# Patient Record
Sex: Female | Born: 1960 | Race: Black or African American | Hispanic: No | Marital: Married | Smoking: Never smoker
Health system: Southern US, Community
[De-identification: ages and names within clinical notes are randomized; demographics above are authoritative.]

## PROBLEM LIST (undated history)

## (undated) DIAGNOSIS — I739 Peripheral vascular disease, unspecified: Secondary | ICD-10-CM

## (undated) DIAGNOSIS — D649 Anemia, unspecified: Secondary | ICD-10-CM

## (undated) DIAGNOSIS — R0602 Shortness of breath: Secondary | ICD-10-CM

## (undated) DIAGNOSIS — J449 Chronic obstructive pulmonary disease, unspecified: Secondary | ICD-10-CM

## (undated) DIAGNOSIS — E7800 Pure hypercholesterolemia, unspecified: Secondary | ICD-10-CM

## (undated) DIAGNOSIS — Z5189 Encounter for other specified aftercare: Secondary | ICD-10-CM

## (undated) DIAGNOSIS — I2510 Atherosclerotic heart disease of native coronary artery without angina pectoris: Secondary | ICD-10-CM

## (undated) DIAGNOSIS — N186 End stage renal disease: Secondary | ICD-10-CM

## (undated) DIAGNOSIS — I509 Heart failure, unspecified: Secondary | ICD-10-CM

## (undated) DIAGNOSIS — K922 Gastrointestinal hemorrhage, unspecified: Secondary | ICD-10-CM

## (undated) DIAGNOSIS — E119 Type 2 diabetes mellitus without complications: Secondary | ICD-10-CM

## (undated) DIAGNOSIS — J189 Pneumonia, unspecified organism: Secondary | ICD-10-CM

## (undated) DIAGNOSIS — I5023 Acute on chronic systolic (congestive) heart failure: Secondary | ICD-10-CM

## (undated) DIAGNOSIS — Z992 Dependence on renal dialysis: Secondary | ICD-10-CM

## (undated) DIAGNOSIS — I209 Angina pectoris, unspecified: Secondary | ICD-10-CM

## (undated) DIAGNOSIS — I10 Essential (primary) hypertension: Secondary | ICD-10-CM

## (undated) DIAGNOSIS — D573 Sickle-cell trait: Secondary | ICD-10-CM

## (undated) DIAGNOSIS — R011 Cardiac murmur, unspecified: Secondary | ICD-10-CM

## (undated) DIAGNOSIS — I82409 Acute embolism and thrombosis of unspecified deep veins of unspecified lower extremity: Secondary | ICD-10-CM

## (undated) DIAGNOSIS — IMO0001 Reserved for inherently not codable concepts without codable children: Secondary | ICD-10-CM

## (undated) HISTORY — PX: VASCULAR SURGERY: SHX849

## (undated) HISTORY — DX: Acute on chronic systolic (congestive) heart failure: I50.23

## (undated) HISTORY — PX: CARDIAC CATHETERIZATION: SHX172

## (undated) HISTORY — PX: AV FISTULA PLACEMENT: SHX1204

---

## 1989-05-05 HISTORY — PX: ABDOMINAL HYSTERECTOMY: SHX81

## 1999-03-10 ENCOUNTER — Encounter: Payer: Self-pay | Admitting: Emergency Medicine

## 1999-03-10 ENCOUNTER — Emergency Department (HOSPITAL_COMMUNITY): Admission: EM | Admit: 1999-03-10 | Discharge: 1999-03-10 | Payer: Self-pay | Admitting: Emergency Medicine

## 2000-07-16 ENCOUNTER — Encounter: Payer: Self-pay | Admitting: Cardiology

## 2000-07-16 ENCOUNTER — Inpatient Hospital Stay (HOSPITAL_COMMUNITY): Admission: AD | Admit: 2000-07-16 | Discharge: 2000-07-18 | Payer: Self-pay | Admitting: Cardiology

## 2001-05-01 ENCOUNTER — Encounter: Payer: Self-pay | Admitting: Cardiology

## 2001-05-01 ENCOUNTER — Encounter: Payer: Self-pay | Admitting: Emergency Medicine

## 2001-05-02 ENCOUNTER — Inpatient Hospital Stay (HOSPITAL_COMMUNITY): Admission: EM | Admit: 2001-05-02 | Discharge: 2001-05-08 | Payer: Self-pay | Admitting: Emergency Medicine

## 2001-05-03 ENCOUNTER — Encounter: Payer: Self-pay | Admitting: Cardiology

## 2001-05-04 ENCOUNTER — Encounter: Payer: Self-pay | Admitting: Cardiovascular Disease

## 2001-06-27 ENCOUNTER — Encounter: Payer: Self-pay | Admitting: Emergency Medicine

## 2001-06-27 ENCOUNTER — Inpatient Hospital Stay (HOSPITAL_COMMUNITY): Admission: EM | Admit: 2001-06-27 | Discharge: 2001-06-30 | Payer: Self-pay | Admitting: Emergency Medicine

## 2001-06-28 ENCOUNTER — Encounter: Payer: Self-pay | Admitting: Cardiovascular Disease

## 2003-07-19 ENCOUNTER — Inpatient Hospital Stay (HOSPITAL_COMMUNITY): Admission: EM | Admit: 2003-07-19 | Discharge: 2003-07-23 | Payer: Self-pay | Admitting: Emergency Medicine

## 2003-12-05 ENCOUNTER — Inpatient Hospital Stay (HOSPITAL_COMMUNITY): Admission: EM | Admit: 2003-12-05 | Discharge: 2003-12-13 | Payer: Self-pay | Admitting: Emergency Medicine

## 2004-02-24 ENCOUNTER — Ambulatory Visit (HOSPITAL_COMMUNITY): Admission: RE | Admit: 2004-02-24 | Discharge: 2004-02-24 | Payer: Self-pay | Admitting: Vascular Surgery

## 2004-05-18 ENCOUNTER — Ambulatory Visit (HOSPITAL_COMMUNITY): Admission: RE | Admit: 2004-05-18 | Discharge: 2004-05-18 | Payer: Self-pay | Admitting: Nephrology

## 2008-06-23 ENCOUNTER — Inpatient Hospital Stay (HOSPITAL_COMMUNITY): Admission: EM | Admit: 2008-06-23 | Discharge: 2008-06-26 | Payer: Self-pay | Admitting: Emergency Medicine

## 2008-06-24 ENCOUNTER — Encounter (INDEPENDENT_AMBULATORY_CARE_PROVIDER_SITE_OTHER): Payer: Self-pay | Admitting: Nephrology

## 2009-04-02 ENCOUNTER — Emergency Department (HOSPITAL_COMMUNITY): Admission: EM | Admit: 2009-04-02 | Discharge: 2009-04-02 | Payer: Self-pay | Admitting: Emergency Medicine

## 2009-09-04 DIAGNOSIS — I82409 Acute embolism and thrombosis of unspecified deep veins of unspecified lower extremity: Secondary | ICD-10-CM

## 2009-09-04 HISTORY — PX: ARTERIOVENOUS GRAFT PLACEMENT: SUR1029

## 2009-09-04 HISTORY — DX: Acute embolism and thrombosis of unspecified deep veins of unspecified lower extremity: I82.409

## 2010-01-10 ENCOUNTER — Ambulatory Visit: Payer: Self-pay | Admitting: Pulmonary Disease

## 2010-01-11 ENCOUNTER — Inpatient Hospital Stay (HOSPITAL_COMMUNITY): Admission: EM | Admit: 2010-01-11 | Discharge: 2010-01-13 | Payer: Self-pay | Admitting: Emergency Medicine

## 2010-02-09 ENCOUNTER — Inpatient Hospital Stay (HOSPITAL_BASED_OUTPATIENT_CLINIC_OR_DEPARTMENT_OTHER): Admission: RE | Admit: 2010-02-09 | Discharge: 2010-02-09 | Payer: Self-pay | Admitting: Cardiology

## 2010-05-05 HISTORY — PX: ABOVE KNEE LEG AMPUTATION: SUR20

## 2010-05-18 ENCOUNTER — Ambulatory Visit: Payer: Self-pay | Admitting: Vascular Surgery

## 2010-05-20 ENCOUNTER — Emergency Department (HOSPITAL_COMMUNITY): Admission: EM | Admit: 2010-05-20 | Discharge: 2010-05-20 | Payer: Self-pay | Admitting: Emergency Medicine

## 2010-05-23 ENCOUNTER — Inpatient Hospital Stay (HOSPITAL_COMMUNITY): Admission: EM | Admit: 2010-05-23 | Discharge: 2010-05-30 | Payer: Self-pay | Admitting: Emergency Medicine

## 2010-05-23 ENCOUNTER — Ambulatory Visit: Payer: Self-pay | Admitting: Vascular Surgery

## 2010-05-25 ENCOUNTER — Encounter: Payer: Self-pay | Admitting: Vascular Surgery

## 2010-05-26 ENCOUNTER — Ambulatory Visit: Payer: Self-pay | Admitting: Physical Medicine & Rehabilitation

## 2010-05-30 ENCOUNTER — Inpatient Hospital Stay (HOSPITAL_COMMUNITY)
Admission: RE | Admit: 2010-05-30 | Discharge: 2010-06-18 | Payer: Self-pay | Admitting: Physical Medicine & Rehabilitation

## 2010-06-04 HISTORY — PX: LEG AMPUTATION BELOW KNEE: SHX694

## 2010-06-08 ENCOUNTER — Ambulatory Visit: Payer: Self-pay | Admitting: Infectious Disease

## 2010-06-10 ENCOUNTER — Encounter: Payer: Self-pay | Admitting: Vascular Surgery

## 2010-06-10 ENCOUNTER — Ambulatory Visit: Payer: Self-pay | Admitting: Surgery

## 2010-06-15 ENCOUNTER — Ambulatory Visit: Payer: Self-pay | Admitting: Physical Medicine & Rehabilitation

## 2010-06-23 ENCOUNTER — Ambulatory Visit: Payer: Self-pay | Admitting: Vascular Surgery

## 2010-06-30 ENCOUNTER — Other Ambulatory Visit: Payer: Self-pay | Admitting: Vascular Surgery

## 2010-07-01 ENCOUNTER — Inpatient Hospital Stay (HOSPITAL_COMMUNITY): Admission: EM | Admit: 2010-07-01 | Discharge: 2010-07-21 | Payer: Self-pay | Admitting: Emergency Medicine

## 2010-07-04 ENCOUNTER — Encounter (INDEPENDENT_AMBULATORY_CARE_PROVIDER_SITE_OTHER): Payer: Self-pay | Admitting: Emergency Medicine

## 2010-07-06 ENCOUNTER — Encounter
Admission: RE | Admit: 2010-07-06 | Discharge: 2010-09-01 | Payer: Self-pay | Source: Home / Self Care | Attending: Physical Medicine & Rehabilitation | Admitting: Physical Medicine & Rehabilitation

## 2010-07-18 ENCOUNTER — Ambulatory Visit: Payer: Self-pay | Admitting: Vascular Surgery

## 2010-07-18 ENCOUNTER — Encounter (INDEPENDENT_AMBULATORY_CARE_PROVIDER_SITE_OTHER): Payer: Self-pay | Admitting: Emergency Medicine

## 2010-07-21 ENCOUNTER — Inpatient Hospital Stay (HOSPITAL_COMMUNITY)
Admission: RE | Admit: 2010-07-21 | Discharge: 2010-07-27 | Payer: Self-pay | Admitting: Physical Medicine & Rehabilitation

## 2010-07-21 ENCOUNTER — Ambulatory Visit: Payer: Self-pay | Admitting: Physical Medicine & Rehabilitation

## 2010-07-22 ENCOUNTER — Ambulatory Visit: Payer: Self-pay | Admitting: Physical Medicine & Rehabilitation

## 2010-08-11 ENCOUNTER — Ambulatory Visit: Payer: Self-pay | Admitting: Vascular Surgery

## 2010-08-18 ENCOUNTER — Ambulatory Visit: Payer: Self-pay | Admitting: Vascular Surgery

## 2010-08-25 ENCOUNTER — Ambulatory Visit: Payer: Self-pay | Admitting: Vascular Surgery

## 2010-09-06 ENCOUNTER — Encounter
Admission: RE | Admit: 2010-09-06 | Discharge: 2010-09-09 | Payer: Self-pay | Source: Home / Self Care | Attending: Physical Medicine & Rehabilitation | Admitting: Physical Medicine & Rehabilitation

## 2010-09-08 ENCOUNTER — Ambulatory Visit
Admission: RE | Admit: 2010-09-08 | Discharge: 2010-09-08 | Payer: Self-pay | Source: Home / Self Care | Attending: Vascular Surgery | Admitting: Vascular Surgery

## 2010-09-09 ENCOUNTER — Ambulatory Visit
Admission: RE | Admit: 2010-09-09 | Discharge: 2010-09-09 | Payer: Self-pay | Source: Home / Self Care | Attending: Physical Medicine & Rehabilitation | Admitting: Physical Medicine & Rehabilitation

## 2010-09-29 ENCOUNTER — Ambulatory Visit
Admission: RE | Admit: 2010-09-29 | Discharge: 2010-09-29 | Payer: Self-pay | Source: Home / Self Care | Attending: Vascular Surgery | Admitting: Vascular Surgery

## 2010-09-30 NOTE — Assessment & Plan Note (Signed)
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  The left below knee amputation was done on October 31 and she has had some difficulty healing up the medial aspect of this.  She reports no significant drainage from this.  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 This is usually relieved with hydrocodone.  She has not previously taken Lyrica or Neurontin for this.  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 She has had no bleeding episodes, so far.  REVIEW OF SYSTEMS:  She denies any shortness of breath, chest pain, fever or chills.  PHYSICAL EXAM:  Vital signs:  Blood pressure 188/91 in the right arm, heart rate 87 and regular, respirations 16.  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  The aneurysm was approximately 4.5 cm in diameter.  The fistula is fairly pulsatile in quality.  There are 2 areas of eschar and ulceration approximately 2 cm in diameter for each of these.  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  The left below knee amputation has an area 4 x 3 cm with dry eschar over this. This was debrided back to bleeding tissue today.  There was some necrotic fat below this but there were areas of bleeding.  This was then covered with a normal saline wet-to-dry dressing.  ASSESSMENT:  Slowly healing left below knee amputation.  We will continue serial debridements and she will follow up in 2 weeks' time  to reevaluate this.  Her daughter was trained today to do normal saline wet- to-dry dressings twice a day.  As far as the AV fistula aneurysm is concerned I believe it is at risk of rupture and I believe the best option at this point would be conversion of the fistula to a graft by placing an interposition segment where the aneurysmal degeneration has occurred.  We will plan to place a left upper arm AV graft and a Diatek catheter on 10/03/2010.  Risks, benefits, possible complications and procedure details of this were explained to the patient today.  She understands and agrees to proceed.    [name]. Fields, MD Electronically Signed  CEF/MEDQ  D:  09/30/2010  T:  09/30/2010  Job:  [id number]  cc:   Cecille Aver, M.D.

## 2010-10-03 ENCOUNTER — Ambulatory Visit (HOSPITAL_COMMUNITY)
Admission: RE | Admit: 2010-10-03 | Discharge: 2010-10-03 | Payer: Self-pay | Source: Home / Self Care | Attending: Vascular Surgery | Admitting: Vascular Surgery

## 2010-10-03 LAB — POCT I-STAT 4, (NA,K, GLUC, HGB,HCT)
HCT: 41 % (ref 36.0–46.0)
Hemoglobin: 13.9 g/dL (ref 12.0–15.0)

## 2010-10-03 LAB — SURGICAL PCR SCREEN: MRSA, PCR: NEGATIVE

## 2010-10-03 LAB — GLUCOSE, CAPILLARY: Glucose-Capillary: 96 mg/dL (ref 70–99)

## 2010-10-07 NOTE — Op Note (Signed)
[name], DONELAN              ACCOUNT NO.:  [id number]  MEDICAL RECORD NO.:  [id number]          PATIENT TYPE:  AMB  LOCATION:  SDS                          FACILITY:  [facility]  PHYSICIAN:  Charles E. Fields, MD  DATE OF BIRTH:  12-01-1960  DATE OF PROCEDURE:  10/03/2010 DATE OF DISCHARGE:                              OPERATIVE REPORT   PROCEDURES: 1. Ultrasound of neck. 2. Insertion of Diatek catheter, left internal jugular vein. 3. Placement of left upper arm AV graft.  PREOPERATIVE DIAGNOSIS:  End-stage renal disease.  POSTOPERATIVE DIAGNOSIS:  End-stage renal disease.  ANESTHESIA:  Local with IV sedation.  Lippe:  [name]. 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  She presents for conversion of this fistula to a graft as well as placement of a Diatek catheter for temporary access until her graft was ready for use.  OPERATIVE FINDINGS: 1. An 8-mm PTFE end-to-end to proximal and distal cephalic vein. 2. A 23-cm Diatek catheter of left internal jugular vein. 3. 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  OPERATIVE DETAILS:  After obtaining informed consent, the patient was taken to the operating room.  The patient was placed in supine position on the operating room table.  After adequate sedation, the patient's entire neck and chest were prepped and draped in usual sterile fashion. Ultrasound was used to identify the right internal jugular vein.  This did not have normal compressibility and was presumably occluded.  Attention was then turned to the left internal jugular vein.  This had normal compressibility on respiratory variation.  Local anesthesia was then infiltrated over the left internal jugular vein.  An introducer needle was used to cannulate the left internal jugular vein and a 0.035 J-tip guide wire was threaded through  the left internal jugular vein through the innominate vein and down into the superior and finally inferior vena cava under fluoroscopic guidance.  Next, sequential 12, 14 and 16-French dilators were placed over the guidewire.  Guidewire and dilator were removed and a 22-cm centimeter Diatek catheter was placed through the peel-away sheath down into the right atrium.  The catheter was then tunneled subcutaneously, cut to the length and the hub attached.  The catheters were noted to flush and draw easily.  The catheter was loaded with concentrated heparin solution.  The catheter was sutured to the skin with nylon sutures.  The neck insertion site was closed with Vicryl stitch.  Attention was then turned to the patient's left upper extremity.  The patient's entire left upper extremity was prepped and draped in usual sterile fashion.  Local anesthesia was infiltrated approximately 2 cm above the antecubital crease over the area of a preexisting left brachiocephalic AV fistula.  Incision was carried down through the subcutaneous tissues down to the level of the fistula.  The fistula was approximately 10 mm in diameter in this location.  This was dissected free circumferentially and a vessel loop placed around this.  Local anesthesia was then infiltrated past the two areas of aneurysmal  generation approximately 7 cm from the shoulder joint.  A longitudinal incision was made in this location and carried down through the subcutaneous tissues and the cephalic vein was dissected free circumferentially.  This is approximately 5 mm in diameter in this location.  A subcutaneous tunnel was then created in an arcing configuration over the biceps muscle connecting the upper and lower arm incisions.  An 8-mm PTFE graft was then brought through the subcutaneous tunnel.  The patient was given 5000 units of intravenous heparin.  The proximal fistula was clamped with a fistula clamp and distal control  was obtained just above this.  The fistula was then transected.  The aneurysmal portion of the fistula was then oversewn with a running 5-0 Prolene suture.  An end-to-end anastomosis was then created from the new PTFE graft to the proximal fistula using a running 6-0 Prolene suture. Just prior to completion of the anastomosis, this was forebled, backbled and thoroughly flushed.  Anastomosis was secured, clamps were released, and there was good pulsatile flow in the graft immediately.  There was some sutured needle home bleeding and this was controlled with thrombin and Gelfoam.  Attention was then turned to the upper arm incision.  The distal vein was controlled with a fistula clamp.  Proximal vein controlled with fistula clamp and the fistula transected.  The vein was of good quality approximately 5 mm in diameter.  The graft was transected and sewn end of graft to end of vein using a running 6-0 Prolene suture.  Just prior to completion of the anastomosis, this was forebled, backbled and thoroughly flushed.  Anastomosis was secured, clamps were released, and there was a palpable thrill above the graft immediately.  The distal end of the preexisting fistula was then compressed, so that all blood was removed from the aneurysmal degenerated segments and then, the distal end of the cephalic vein was oversewn using a running 5-0 Prolene suture.  The patient was given 50 mg of protamine for reversal of heparin.  Subcutaneous tissues of both incisions were reapproximated after obtaining hemostasis.  Each was closed with a running 3-0 Vicryl subcutaneous stitch and then a 4-0 Vicryl subcuticular stitch in the skin.  Doppler was used to evaluate the radial and ulnar arteries, these both had good Doppler flow. Dermabond was applied to both incisions.  The patient was taken to the PACU in stable condition.     [name]. Fields, MD     CEF/MEDQ  D:  10/03/2010  T:  10/04/2010  Job:   [id number]  Electronically Signed by Fabienne Bruns MD on 10/07/2010 01:11:46 PM

## 2010-10-13 ENCOUNTER — Ambulatory Visit (INDEPENDENT_AMBULATORY_CARE_PROVIDER_SITE_OTHER): Payer: BLUE CROSS/BLUE SHIELD | Admitting: Vascular Surgery

## 2010-10-13 DIAGNOSIS — I70219 Atherosclerosis of native arteries of extremities with intermittent claudication, unspecified extremity: Secondary | ICD-10-CM

## 2010-10-14 NOTE — Assessment & Plan Note (Signed)
OFFICE VISIT  KEIKO, MYRICKS DOB:  05/29/1961                                       10/13/2010 [id number]#:[id number]  The patient returns for followup today.  She had a left upper arm graft placed on January 30.  We have also been following her for a poorly healing wound of her left below knee amputation.  She returns today for followup.  She denies any symptoms of steal in her left upper extremity. She has been doing normal saline wet-to-dry dressings to her left BKA twice daily.  Her daughter has been doing the wound care.  PHYSICAL EXAM:  Vital signs:  Today blood pressure is 156/78 in the right arm, heart rate is 81 and regular.  Temperature is 98.4.  Upper extremity:  The left upper arm AV graft has healing antecubital and upper arm incisions.  There is an easily audible bruit within the graft. 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  Her left below knee amputation has a 2 x 2 cm open area in the medial aspect.  This has good granulation tissue in the base at this point and I believe this should continue to heal over the next few weeks.  Overall the patient is doing well.  Her graft should be ready for use in a few more weeks.  Her BKA will continue to follow with local wound care.  She will follow up with me in 2 weeks' time to recheck her graft and her below knee amputation.  She will continue to do normal saline dressings twice daily.    [name]. Bretta Fees, MD Electronically Signed  CEF/MEDQ  D:  10/13/2010  T:  10/14/2010  Job:  [id number]

## 2010-10-27 ENCOUNTER — Ambulatory Visit (INDEPENDENT_AMBULATORY_CARE_PROVIDER_SITE_OTHER): Payer: Medicare Other | Admitting: Vascular Surgery

## 2010-10-27 DIAGNOSIS — L98499 Non-pressure chronic ulcer of skin of other sites with unspecified severity: Secondary | ICD-10-CM

## 2010-10-27 DIAGNOSIS — I739 Peripheral vascular disease, unspecified: Secondary | ICD-10-CM

## 2010-10-28 NOTE — Assessment & Plan Note (Signed)
OFFICE VISIT  Paula, BENTLEY [name] DOB:  Oct 09, 1960                                       10/27/2010 [id number]#:[id number]  The patient returns for followup today for further evaluation of her left below knee amputation as well as recent placement of a left upper arm AV graft for aneurysmally degenerated left upper arm AV fistula. Her left arm AV graft was placed on 10/03/2010.  Her below knee amputation was on 07/04/2010.  Her daughter is currently doing wound care on the below knee amputation, normal saline wet-to-dry twice a day.  PHYSICAL EXAM:  Blood pressure is 153/82 in the right arm, heart rate is 87 and regular, temperature is 98.  She has an easily palpable thrill and audible bruit in her left upper arm AV graft.  Incisions are well- healed.  She has a 2 x 2 cm opening in the left below knee amputation. There is very healthy-appearing granulation tissue at the base of this at this point.  Overall I believe this wound should heal in the long- term.  In summary, the patient's left upper arm AV graft is healing well and should be ready for use soon.  Her left below knee amputation continues to heal and should continue followup for this.  We will see her again in 3 weeks.    Paula Hora. Fields, MD Electronically Signed  CEF/MEDQ  D:  10/27/2010  T:  10/28/2010  Job:  [id number]

## 2010-11-15 LAB — RENAL FUNCTION PANEL
Albumin: 1.6 g/dL — ABNORMAL LOW (ref 3.5–5.2)
Albumin: 1.7 g/dL — ABNORMAL LOW (ref 3.5–5.2)
Albumin: 1.7 g/dL — ABNORMAL LOW (ref 3.5–5.2)
Albumin: 1.8 g/dL — ABNORMAL LOW (ref 3.5–5.2)
BUN: 33 mg/dL — ABNORMAL HIGH (ref 6–23)
BUN: 40 mg/dL — ABNORMAL HIGH (ref 6–23)
BUN: 42 mg/dL — ABNORMAL HIGH (ref 6–23)
BUN: 53 mg/dL — ABNORMAL HIGH (ref 6–23)
CO2: 24 mEq/L (ref 19–32)
CO2: 25 mEq/L (ref 19–32)
CO2: 27 mEq/L (ref 19–32)
CO2: 27 mEq/L (ref 19–32)
CO2: 28 mEq/L (ref 19–32)
CO2: 29 mEq/L (ref 19–32)
Calcium: 7.9 mg/dL — ABNORMAL LOW (ref 8.4–10.5)
Calcium: 8 mg/dL — ABNORMAL LOW (ref 8.4–10.5)
Calcium: 8.3 mg/dL — ABNORMAL LOW (ref 8.4–10.5)
Calcium: 8.6 mg/dL (ref 8.4–10.5)
Calcium: 8.6 mg/dL (ref 8.4–10.5)
Calcium: 8.7 mg/dL (ref 8.4–10.5)
Chloride: 101 mEq/L (ref 96–112)
Chloride: 102 mEq/L (ref 96–112)
Chloride: 95 mEq/L — ABNORMAL LOW (ref 96–112)
Chloride: 99 mEq/L (ref 96–112)
Creatinine, Ser: 4.17 mg/dL — ABNORMAL HIGH (ref 0.4–1.2)
Creatinine, Ser: 6.21 mg/dL — ABNORMAL HIGH (ref 0.4–1.2)
Creatinine, Ser: 6.4 mg/dL — ABNORMAL HIGH (ref 0.4–1.2)
Creatinine, Ser: 6.95 mg/dL — ABNORMAL HIGH (ref 0.4–1.2)
Creatinine, Ser: 6.96 mg/dL — ABNORMAL HIGH (ref 0.4–1.2)
Creatinine, Ser: 7.14 mg/dL — ABNORMAL HIGH (ref 0.4–1.2)
Creatinine, Ser: 7.45 mg/dL — ABNORMAL HIGH (ref 0.4–1.2)
Creatinine, Ser: 7.88 mg/dL — ABNORMAL HIGH (ref 0.4–1.2)
GFR calc Af Amer: 14 mL/min — ABNORMAL LOW (ref >60)
GFR calc Af Amer: 7 mL/min — ABNORMAL LOW (ref >60)
GFR calc Af Amer: 7 mL/min — ABNORMAL LOW (ref >60)
GFR calc Af Amer: 8 mL/min — ABNORMAL LOW (ref >60)
GFR calc Af Amer: 8 mL/min — ABNORMAL LOW (ref >60)
GFR calc non Af Amer: 11 mL/min — ABNORMAL LOW (ref >60)
GFR calc non Af Amer: 5 mL/min — ABNORMAL LOW (ref >60)
GFR calc non Af Amer: 6 mL/min — ABNORMAL LOW (ref >60)
GFR calc non Af Amer: 6 mL/min — ABNORMAL LOW (ref >60)
GFR calc non Af Amer: 6 mL/min — ABNORMAL LOW (ref >60)
GFR calc non Af Amer: 6 mL/min — ABNORMAL LOW (ref >60)
Glucose, Bld: 116 mg/dL — ABNORMAL HIGH (ref 70–99)
Glucose, Bld: 121 mg/dL — ABNORMAL HIGH (ref 70–99)
Glucose, Bld: 129 mg/dL — ABNORMAL HIGH (ref 70–99)
Glucose, Bld: 155 mg/dL — ABNORMAL HIGH (ref 70–99)
Glucose, Bld: 90 mg/dL (ref 70–99)
Glucose, Bld: 92 mg/dL (ref 70–99)
Phosphorus: 3 mg/dL (ref 2.3–4.6)
Phosphorus: 3.5 mg/dL (ref 2.3–4.6)
Phosphorus: 3.7 mg/dL (ref 2.3–4.6)
Phosphorus: 4 mg/dL (ref 2.3–4.6)
Potassium: 3.6 mEq/L (ref 3.5–5.1)
Potassium: 4.5 mEq/L (ref 3.5–5.1)
Potassium: 4.5 mEq/L (ref 3.5–5.1)
Sodium: 133 mEq/L — ABNORMAL LOW (ref 135–145)
Sodium: 134 mEq/L — ABNORMAL LOW (ref 135–145)
Sodium: 136 mEq/L (ref 135–145)

## 2010-11-15 LAB — GLUCOSE, CAPILLARY
Glucose-Capillary: 100 mg/dL — ABNORMAL HIGH (ref 70–99)
Glucose-Capillary: 100 mg/dL — ABNORMAL HIGH (ref 70–99)
Glucose-Capillary: 101 mg/dL — ABNORMAL HIGH (ref 70–99)
Glucose-Capillary: 103 mg/dL — ABNORMAL HIGH (ref 70–99)
Glucose-Capillary: 106 mg/dL — ABNORMAL HIGH (ref 70–99)
Glucose-Capillary: 106 mg/dL — ABNORMAL HIGH (ref 70–99)
Glucose-Capillary: 107 mg/dL — ABNORMAL HIGH (ref 70–99)
Glucose-Capillary: 107 mg/dL — ABNORMAL HIGH (ref 70–99)
Glucose-Capillary: 114 mg/dL — ABNORMAL HIGH (ref 70–99)
Glucose-Capillary: 116 mg/dL — ABNORMAL HIGH (ref 70–99)
Glucose-Capillary: 117 mg/dL — ABNORMAL HIGH (ref 70–99)
Glucose-Capillary: 117 mg/dL — ABNORMAL HIGH (ref 70–99)
Glucose-Capillary: 122 mg/dL — ABNORMAL HIGH (ref 70–99)
Glucose-Capillary: 136 mg/dL — ABNORMAL HIGH (ref 70–99)
Glucose-Capillary: 136 mg/dL — ABNORMAL HIGH (ref 70–99)
Glucose-Capillary: 143 mg/dL — ABNORMAL HIGH (ref 70–99)
Glucose-Capillary: 148 mg/dL — ABNORMAL HIGH (ref 70–99)
Glucose-Capillary: 152 mg/dL — ABNORMAL HIGH (ref 70–99)
Glucose-Capillary: 153 mg/dL — ABNORMAL HIGH (ref 70–99)
Glucose-Capillary: 155 mg/dL — ABNORMAL HIGH (ref 70–99)
Glucose-Capillary: 155 mg/dL — ABNORMAL HIGH (ref 70–99)
Glucose-Capillary: 163 mg/dL — ABNORMAL HIGH (ref 70–99)
Glucose-Capillary: 166 mg/dL — ABNORMAL HIGH (ref 70–99)
Glucose-Capillary: 174 mg/dL — ABNORMAL HIGH (ref 70–99)
Glucose-Capillary: 174 mg/dL — ABNORMAL HIGH (ref 70–99)
Glucose-Capillary: 174 mg/dL — ABNORMAL HIGH (ref 70–99)
Glucose-Capillary: 177 mg/dL — ABNORMAL HIGH (ref 70–99)
Glucose-Capillary: 185 mg/dL — ABNORMAL HIGH (ref 70–99)
Glucose-Capillary: 192 mg/dL — ABNORMAL HIGH (ref 70–99)
Glucose-Capillary: 230 mg/dL — ABNORMAL HIGH (ref 70–99)
Glucose-Capillary: 63 mg/dL — ABNORMAL LOW (ref 70–99)
Glucose-Capillary: 66 mg/dL — ABNORMAL LOW (ref 70–99)
Glucose-Capillary: 71 mg/dL (ref 70–99)
Glucose-Capillary: 75 mg/dL (ref 70–99)
Glucose-Capillary: 82 mg/dL (ref 70–99)
Glucose-Capillary: 83 mg/dL (ref 70–99)
Glucose-Capillary: 91 mg/dL (ref 70–99)
Glucose-Capillary: 93 mg/dL (ref 70–99)
Glucose-Capillary: 95 mg/dL (ref 70–99)
Glucose-Capillary: 99 mg/dL (ref 70–99)

## 2010-11-15 LAB — BASIC METABOLIC PANEL
BUN: 25 mg/dL — ABNORMAL HIGH (ref 6–23)
BUN: 32 mg/dL — ABNORMAL HIGH (ref 6–23)
CO2: 28 mEq/L (ref 19–32)
Calcium: 8.4 mg/dL (ref 8.4–10.5)
Calcium: 8.7 mg/dL (ref 8.4–10.5)
Chloride: 94 mEq/L — ABNORMAL LOW (ref 96–112)
Chloride: 94 mEq/L — ABNORMAL LOW (ref 96–112)
Creatinine, Ser: 7.83 mg/dL — ABNORMAL HIGH (ref 0.4–1.2)
GFR calc Af Amer: 7 mL/min — ABNORMAL LOW (ref >60)
GFR calc non Af Amer: 5 mL/min — ABNORMAL LOW (ref >60)
Glucose, Bld: 68 mg/dL — ABNORMAL LOW (ref 70–99)
Glucose, Bld: 92 mg/dL (ref 70–99)
Potassium: 3.9 mEq/L (ref 3.5–5.1)
Sodium: 134 mEq/L — ABNORMAL LOW (ref 135–145)

## 2010-11-15 LAB — CBC
HCT: 25.5 % — ABNORMAL LOW (ref 36.0–46.0)
HCT: 26.8 % — ABNORMAL LOW (ref 36.0–46.0)
HCT: 27.1 % — ABNORMAL LOW (ref 36.0–46.0)
HCT: 28.1 % — ABNORMAL LOW (ref 36.0–46.0)
HCT: 29.7 % — ABNORMAL LOW (ref 36.0–46.0)
HCT: 31.6 % — ABNORMAL LOW (ref 36.0–46.0)
HCT: 35.7 % — ABNORMAL LOW (ref 36.0–46.0)
Hemoglobin: 11.3 g/dL — ABNORMAL LOW (ref 12.0–15.0)
Hemoglobin: 7.8 g/dL — ABNORMAL LOW (ref 12.0–15.0)
Hemoglobin: 8.3 g/dL — ABNORMAL LOW (ref 12.0–15.0)
Hemoglobin: 8.7 g/dL — ABNORMAL LOW (ref 12.0–15.0)
Hemoglobin: 9.2 g/dL — ABNORMAL LOW (ref 12.0–15.0)
Hemoglobin: 9.7 g/dL — ABNORMAL LOW (ref 12.0–15.0)
Hemoglobin: 9.7 g/dL — ABNORMAL LOW (ref 12.0–15.0)
Hemoglobin: 9.9 g/dL — ABNORMAL LOW (ref 12.0–15.0)
MCH: 27.8 pg (ref 26.0–34.0)
MCH: 27.9 pg (ref 26.0–34.0)
MCH: 27.9 pg (ref 26.0–34.0)
MCH: 28.4 pg (ref 26.0–34.0)
MCH: 28.4 pg (ref 26.0–34.0)
MCH: 28.5 pg (ref 26.0–34.0)
MCH: 28.7 pg (ref 26.0–34.0)
MCH: 29.1 pg (ref 26.0–34.0)
MCH: 29.2 pg (ref 26.0–34.0)
MCHC: 30.2 g/dL (ref 30.0–36.0)
MCHC: 30.5 g/dL (ref 30.0–36.0)
MCHC: 30.6 g/dL (ref 30.0–36.0)
MCHC: 30.6 g/dL (ref 30.0–36.0)
MCHC: 30.6 g/dL (ref 30.0–36.0)
MCHC: 31.1 g/dL (ref 30.0–36.0)
MCHC: 31.2 g/dL (ref 30.0–36.0)
MCHC: 31.5 g/dL (ref 30.0–36.0)
MCV: 90.5 fL (ref 78.0–100.0)
MCV: 90.7 fL (ref 78.0–100.0)
MCV: 91.2 fL (ref 78.0–100.0)
MCV: 91.3 fL (ref 78.0–100.0)
MCV: 91.6 fL (ref 78.0–100.0)
MCV: 91.7 fL (ref 78.0–100.0)
MCV: 92.7 fL (ref 78.0–100.0)
MCV: 92.8 fL (ref 78.0–100.0)
Platelets: 309 10*3/uL (ref 150–400)
Platelets: 358 10*3/uL (ref 150–400)
Platelets: 379 10*3/uL (ref 150–400)
Platelets: 382 10*3/uL (ref 150–400)
Platelets: 390 10*3/uL (ref 150–400)
RBC: 2.81 MIL/uL — ABNORMAL LOW (ref 3.87–5.11)
RBC: 2.97 MIL/uL — ABNORMAL LOW (ref 3.87–5.11)
RBC: 3.03 MIL/uL — ABNORMAL LOW (ref 3.87–5.11)
RBC: 3.06 MIL/uL — ABNORMAL LOW (ref 3.87–5.11)
RBC: 3.22 MIL/uL — ABNORMAL LOW (ref 3.87–5.11)
RBC: 3.32 MIL/uL — ABNORMAL LOW (ref 3.87–5.11)
RBC: 3.46 MIL/uL — ABNORMAL LOW (ref 3.87–5.11)
RBC: 3.49 MIL/uL — ABNORMAL LOW (ref 3.87–5.11)
RDW: 16.2 % — ABNORMAL HIGH (ref 11.5–15.5)
RDW: 16.3 % — ABNORMAL HIGH (ref 11.5–15.5)
RDW: 16.3 % — ABNORMAL HIGH (ref 11.5–15.5)
RDW: 16.4 % — ABNORMAL HIGH (ref 11.5–15.5)
RDW: 16.6 % — ABNORMAL HIGH (ref 11.5–15.5)
WBC: 10.5 10*3/uL (ref 4.0–10.5)
WBC: 11.9 10*3/uL — ABNORMAL HIGH (ref 4.0–10.5)
WBC: 9.4 10*3/uL (ref 4.0–10.5)
WBC: 9.8 10*3/uL (ref 4.0–10.5)

## 2010-11-15 LAB — HEMOCCULT GUIAC POC 1CARD (OFFICE): Fecal Occult Bld: POSITIVE

## 2010-11-15 LAB — IRON AND TIBC
Iron: 29 ug/dL — ABNORMAL LOW (ref 42–135)
Iron: 46 ug/dL (ref 42–135)
Saturation Ratios: 26 % (ref 20–55)
TIBC: 142 ug/dL — ABNORMAL LOW (ref 250–470)
UIBC: 81 ug/dL
UIBC: 96 ug/dL

## 2010-11-15 LAB — CROSSMATCH: Unit division: 0

## 2010-11-15 LAB — PHOSPHORUS: Phosphorus: 6.4 mg/dL — ABNORMAL HIGH (ref 2.3–4.6)

## 2010-11-16 LAB — CBC
HCT: 20 % — ABNORMAL LOW (ref 36.0–46.0)
HCT: 22.6 % — ABNORMAL LOW (ref 36.0–46.0)
HCT: 31.2 % — ABNORMAL LOW (ref 36.0–46.0)
HCT: 31.6 % — ABNORMAL LOW (ref 36.0–46.0)
HCT: 27.4 % — ABNORMAL LOW (ref 36.0–46.0)
HCT: 29.4 % — ABNORMAL LOW (ref 36.0–46.0)
HCT: 30.1 % — ABNORMAL LOW (ref 36.0–46.0)
Hemoglobin: 10 g/dL — ABNORMAL LOW (ref 12.0–15.0)
Hemoglobin: 10 g/dL — ABNORMAL LOW (ref 12.0–15.0)
Hemoglobin: 7 g/dL — ABNORMAL LOW (ref 12.0–15.0)
Hemoglobin: 9.2 g/dL — ABNORMAL LOW (ref 12.0–15.0)
MCH: 27.7 pg (ref 26.0–34.0)
MCH: 28 pg (ref 26.0–34.0)
MCH: 28.9 pg (ref 26.0–34.0)
MCH: 28.7 pg (ref 26.0–34.0)
MCH: 28.9 pg (ref 26.0–34.0)
MCH: 29.6 pg (ref 26.0–34.0)
MCHC: 29.5 g/dL — ABNORMAL LOW (ref 30.0–36.0)
MCHC: 31.6 g/dL (ref 30.0–36.0)
MCHC: 32.1 g/dL (ref 30.0–36.0)
MCHC: 30.6 g/dL (ref 30.0–36.0)
MCHC: 31.5 g/dL (ref 30.0–36.0)
MCHC: 32.3 g/dL (ref 30.0–36.0)
MCV: 89.3 fL (ref 78.0–100.0)
MCV: 90.2 fL (ref 78.0–100.0)
MCV: 89.5 fL (ref 78.0–100.0)
MCV: 91 fL (ref 78.0–100.0)
MCV: 91.7 fL (ref 78.0–100.0)
Platelets: 419 10*3/uL — ABNORMAL HIGH (ref 150–400)
Platelets: 360 10*3/uL (ref 150–400)
Platelets: 370 10*3/uL (ref 150–400)
Platelets: 373 10*3/uL (ref 150–400)
Platelets: 426 10*3/uL — ABNORMAL HIGH (ref 150–400)
RBC: 2.53 MIL/uL — ABNORMAL LOW (ref 3.87–5.11)
RBC: 3.36 MIL/uL — ABNORMAL LOW (ref 3.87–5.11)
RBC: 3.46 MIL/uL — ABNORMAL LOW (ref 3.87–5.11)
RBC: 3.06 MIL/uL — ABNORMAL LOW (ref 3.87–5.11)
RBC: 3.21 MIL/uL — ABNORMAL LOW (ref 3.87–5.11)
RBC: 3.24 MIL/uL — ABNORMAL LOW (ref 3.87–5.11)
RBC: 3.33 MIL/uL — ABNORMAL LOW (ref 3.87–5.11)
RBC: 3.5 MIL/uL — ABNORMAL LOW (ref 3.87–5.11)
RDW: 15.9 % — ABNORMAL HIGH (ref 11.5–15.5)
RDW: 16.6 % — ABNORMAL HIGH (ref 11.5–15.5)
RDW: 14.4 % (ref 11.5–15.5)
RDW: 14.6 % (ref 11.5–15.5)
RDW: 14.9 % (ref 11.5–15.5)
RDW: 15.6 % — ABNORMAL HIGH (ref 11.5–15.5)
RDW: 16.2 % — ABNORMAL HIGH (ref 11.5–15.5)
WBC: 13.5 10*3/uL — ABNORMAL HIGH (ref 4.0–10.5)
WBC: 14.5 10*3/uL — ABNORMAL HIGH (ref 4.0–10.5)
WBC: 15.7 10*3/uL — ABNORMAL HIGH (ref 4.0–10.5)
WBC: 10.6 10*3/uL — ABNORMAL HIGH (ref 4.0–10.5)
WBC: 11.2 10*3/uL — ABNORMAL HIGH (ref 4.0–10.5)
WBC: 19.7 10*3/uL — ABNORMAL HIGH (ref 4.0–10.5)

## 2010-11-16 LAB — COMPREHENSIVE METABOLIC PANEL
ALT: 23 U/L (ref 0–35)
ALT: 27 U/L (ref 0–35)
AST: 28 U/L (ref 0–37)
AST: 30 U/L (ref 0–37)
Albumin: 2.1 g/dL — ABNORMAL LOW (ref 3.5–5.2)
Albumin: 2.2 g/dL — ABNORMAL LOW (ref 3.5–5.2)
Alkaline Phosphatase: 51 U/L (ref 39–117)
Alkaline Phosphatase: 55 U/L (ref 39–117)
Chloride: 94 mEq/L — ABNORMAL LOW (ref 96–112)
GFR calc Af Amer: 6 mL/min — ABNORMAL LOW (ref >60)
Glucose, Bld: 89 mg/dL (ref 70–99)
Potassium: 3.3 mEq/L — ABNORMAL LOW (ref 3.5–5.1)
Potassium: 3.5 mEq/L (ref 3.5–5.1)
Sodium: 136 mEq/L (ref 135–145)
Sodium: 136 mEq/L (ref 135–145)
Total Bilirubin: 0.8 mg/dL (ref 0.3–1.2)
Total Protein: 7.5 g/dL (ref 6.0–8.3)
Total Protein: 7.8 g/dL (ref 6.0–8.3)

## 2010-11-16 LAB — RENAL FUNCTION PANEL
Albumin: 2.5 g/dL — ABNORMAL LOW (ref 3.5–5.2)
Albumin: 2.1 g/dL — ABNORMAL LOW (ref 3.5–5.2)
Albumin: 2.2 g/dL — ABNORMAL LOW (ref 3.5–5.2)
Albumin: 2.4 g/dL — ABNORMAL LOW (ref 3.5–5.2)
BUN: 31 mg/dL — ABNORMAL HIGH (ref 6–23)
BUN: 29 mg/dL — ABNORMAL HIGH (ref 6–23)
BUN: 29 mg/dL — ABNORMAL HIGH (ref 6–23)
BUN: 41 mg/dL — ABNORMAL HIGH (ref 6–23)
BUN: 50 mg/dL — ABNORMAL HIGH (ref 6–23)
CO2: 26 mEq/L (ref 19–32)
CO2: 28 mEq/L (ref 19–32)
CO2: 22 mEq/L (ref 19–32)
Calcium: 9.1 mg/dL (ref 8.4–10.5)
Calcium: 8.8 mg/dL (ref 8.4–10.5)
Chloride: 100 mEq/L (ref 96–112)
Chloride: 95 mEq/L — ABNORMAL LOW (ref 96–112)
Chloride: 91 mEq/L — ABNORMAL LOW (ref 96–112)
Chloride: 92 mEq/L — ABNORMAL LOW (ref 96–112)
Chloride: 95 mEq/L — ABNORMAL LOW (ref 96–112)
Creatinine, Ser: 8.37 mg/dL — ABNORMAL HIGH (ref 0.4–1.2)
Creatinine, Ser: 9.01 mg/dL — ABNORMAL HIGH (ref 0.4–1.2)
Creatinine, Ser: 9.91 mg/dL — ABNORMAL HIGH (ref 0.4–1.2)
GFR calc Af Amer: 12 mL/min — ABNORMAL LOW (ref >60)
GFR calc Af Amer: 7 mL/min — ABNORMAL LOW (ref >60)
GFR calc Af Amer: 5 mL/min — ABNORMAL LOW (ref >60)
GFR calc Af Amer: 6 mL/min — ABNORMAL LOW (ref >60)
GFR calc non Af Amer: 10 mL/min — ABNORMAL LOW (ref >60)
GFR calc non Af Amer: 6 mL/min — ABNORMAL LOW (ref >60)
Glucose, Bld: 88 mg/dL (ref 70–99)
Glucose, Bld: 94 mg/dL (ref 70–99)
Glucose, Bld: 108 mg/dL — ABNORMAL HIGH (ref 70–99)
Glucose, Bld: 159 mg/dL — ABNORMAL HIGH (ref 70–99)
Phosphorus: 4.8 mg/dL — ABNORMAL HIGH (ref 2.3–4.6)
Phosphorus: 6.1 mg/dL — ABNORMAL HIGH (ref 2.3–4.6)
Phosphorus: 7.7 mg/dL — ABNORMAL HIGH (ref 2.3–4.6)
Potassium: 3.5 mEq/L (ref 3.5–5.1)
Potassium: 3.6 mEq/L (ref 3.5–5.1)
Potassium: 4.5 mEq/L (ref 3.5–5.1)
Potassium: 3.2 mEq/L — ABNORMAL LOW (ref 3.5–5.1)
Potassium: 3.4 mEq/L — ABNORMAL LOW (ref 3.5–5.1)
Potassium: 4.4 mEq/L (ref 3.5–5.1)
Sodium: 137 mEq/L (ref 135–145)
Sodium: 130 mEq/L — ABNORMAL LOW (ref 135–145)

## 2010-11-16 LAB — GLUCOSE, CAPILLARY
Glucose-Capillary: 109 mg/dL — ABNORMAL HIGH (ref 70–99)
Glucose-Capillary: 126 mg/dL — ABNORMAL HIGH (ref 70–99)
Glucose-Capillary: 127 mg/dL — ABNORMAL HIGH (ref 70–99)
Glucose-Capillary: 150 mg/dL — ABNORMAL HIGH (ref 70–99)
Glucose-Capillary: 153 mg/dL — ABNORMAL HIGH (ref 70–99)
Glucose-Capillary: 58 mg/dL — ABNORMAL LOW (ref 70–99)
Glucose-Capillary: 65 mg/dL — ABNORMAL LOW (ref 70–99)
Glucose-Capillary: 86 mg/dL (ref 70–99)
Glucose-Capillary: 94 mg/dL (ref 70–99)
Glucose-Capillary: 105 mg/dL — ABNORMAL HIGH (ref 70–99)
Glucose-Capillary: 106 mg/dL — ABNORMAL HIGH (ref 70–99)
Glucose-Capillary: 109 mg/dL — ABNORMAL HIGH (ref 70–99)
Glucose-Capillary: 112 mg/dL — ABNORMAL HIGH (ref 70–99)
Glucose-Capillary: 114 mg/dL — ABNORMAL HIGH (ref 70–99)
Glucose-Capillary: 122 mg/dL — ABNORMAL HIGH (ref 70–99)
Glucose-Capillary: 125 mg/dL — ABNORMAL HIGH (ref 70–99)
Glucose-Capillary: 128 mg/dL — ABNORMAL HIGH (ref 70–99)
Glucose-Capillary: 141 mg/dL — ABNORMAL HIGH (ref 70–99)
Glucose-Capillary: 141 mg/dL — ABNORMAL HIGH (ref 70–99)
Glucose-Capillary: 143 mg/dL — ABNORMAL HIGH (ref 70–99)
Glucose-Capillary: 145 mg/dL — ABNORMAL HIGH (ref 70–99)
Glucose-Capillary: 145 mg/dL — ABNORMAL HIGH (ref 70–99)
Glucose-Capillary: 146 mg/dL — ABNORMAL HIGH (ref 70–99)
Glucose-Capillary: 146 mg/dL — ABNORMAL HIGH (ref 70–99)
Glucose-Capillary: 149 mg/dL — ABNORMAL HIGH (ref 70–99)
Glucose-Capillary: 153 mg/dL — ABNORMAL HIGH (ref 70–99)
Glucose-Capillary: 154 mg/dL — ABNORMAL HIGH (ref 70–99)
Glucose-Capillary: 155 mg/dL — ABNORMAL HIGH (ref 70–99)
Glucose-Capillary: 157 mg/dL — ABNORMAL HIGH (ref 70–99)
Glucose-Capillary: 162 mg/dL — ABNORMAL HIGH (ref 70–99)
Glucose-Capillary: 166 mg/dL — ABNORMAL HIGH (ref 70–99)
Glucose-Capillary: 166 mg/dL — ABNORMAL HIGH (ref 70–99)
Glucose-Capillary: 173 mg/dL — ABNORMAL HIGH (ref 70–99)
Glucose-Capillary: 205 mg/dL — ABNORMAL HIGH (ref 70–99)
Glucose-Capillary: 89 mg/dL (ref 70–99)
Glucose-Capillary: 97 mg/dL (ref 70–99)

## 2010-11-16 LAB — BASIC METABOLIC PANEL
BUN: 24 mg/dL — ABNORMAL HIGH (ref 6–23)
CO2: 27 mEq/L (ref 19–32)
Calcium: 9.5 mg/dL (ref 8.4–10.5)
Chloride: 99 mEq/L (ref 96–112)
GFR calc Af Amer: 8 mL/min — ABNORMAL LOW (ref >60)
GFR calc non Af Amer: 6 mL/min — ABNORMAL LOW (ref >60)
Glucose, Bld: 132 mg/dL — ABNORMAL HIGH (ref 70–99)
Glucose, Bld: 85 mg/dL (ref 70–99)
Sodium: 136 mEq/L (ref 135–145)

## 2010-11-16 LAB — TYPE AND SCREEN: Unit division: 0

## 2010-11-16 LAB — HIV ANTIBODY (ROUTINE TESTING W REFLEX): HIV: NONREACTIVE

## 2010-11-16 LAB — URINALYSIS, MICROSCOPIC ONLY
Glucose, UA: NEGATIVE mg/dL
pH: 6.5 (ref 5.0–8.0)

## 2010-11-16 LAB — DIFFERENTIAL
Basophils Absolute: 0 10*3/uL (ref 0.0–0.1)
Basophils Absolute: 0 10*3/uL (ref 0.0–0.1)
Basophils Relative: 0 % (ref 0–1)
Basophils Relative: 0 % (ref 0–1)
Basophils Relative: 0 % (ref 0–1)
Eosinophils Absolute: 0.1 10*3/uL (ref 0.0–0.7)
Eosinophils Absolute: 0.2 10*3/uL (ref 0.0–0.7)
Eosinophils Absolute: 0.3 10*3/uL (ref 0.0–0.7)
Eosinophils Relative: 1 % (ref 0–5)
Eosinophils Relative: 3 % (ref 0–5)
Lymphs Abs: 1.3 10*3/uL (ref 0.7–4.0)
Monocytes Absolute: 1 10*3/uL (ref 0.1–1.0)
Monocytes Absolute: 0.9 10*3/uL (ref 0.1–1.0)
Monocytes Absolute: 1.1 10*3/uL — ABNORMAL HIGH (ref 0.1–1.0)
Monocytes Absolute: 1.1 10*3/uL — ABNORMAL HIGH (ref 0.1–1.0)
Monocytes Relative: 11 % (ref 3–12)
Monocytes Relative: 8 % (ref 3–12)
Neutro Abs: 10.5 10*3/uL — ABNORMAL HIGH (ref 1.7–7.7)
Neutro Abs: 8.5 10*3/uL — ABNORMAL HIGH (ref 1.7–7.7)
Neutrophils Relative %: 78 % — ABNORMAL HIGH (ref 43–77)
Neutrophils Relative %: 74 % (ref 43–77)
Neutrophils Relative %: 80 % — ABNORMAL HIGH (ref 43–77)

## 2010-11-16 LAB — CULTURE, BLOOD (ROUTINE X 2): Culture: NO GROWTH

## 2010-11-16 LAB — URINE CULTURE

## 2010-11-16 LAB — SURGICAL PCR SCREEN: Staphylococcus aureus: NEGATIVE

## 2010-11-16 LAB — AMYLASE: Amylase: 139 U/L — ABNORMAL HIGH (ref 0–105)

## 2010-11-17 ENCOUNTER — Ambulatory Visit (INDEPENDENT_AMBULATORY_CARE_PROVIDER_SITE_OTHER): Payer: Medicare Other | Admitting: Vascular Surgery

## 2010-11-17 DIAGNOSIS — I70219 Atherosclerosis of native arteries of extremities with intermittent claudication, unspecified extremity: Secondary | ICD-10-CM

## 2010-11-17 LAB — CBC
HCT: 20.8 % — ABNORMAL LOW (ref 36.0–46.0)
HCT: 23.9 % — ABNORMAL LOW (ref 36.0–46.0)
HCT: 25.7 % — ABNORMAL LOW (ref 36.0–46.0)
HCT: 31.5 % — ABNORMAL LOW (ref 36.0–46.0)
HCT: 33.4 % — ABNORMAL LOW (ref 36.0–46.0)
Hemoglobin: 10.2 g/dL — ABNORMAL LOW (ref 12.0–15.0)
Hemoglobin: 10.3 g/dL — ABNORMAL LOW (ref 12.0–15.0)
Hemoglobin: 10.4 g/dL — ABNORMAL LOW (ref 12.0–15.0)
Hemoglobin: 7 g/dL — ABNORMAL LOW (ref 12.0–15.0)
Hemoglobin: 7.9 g/dL — ABNORMAL LOW (ref 12.0–15.0)
Hemoglobin: 8 g/dL — ABNORMAL LOW (ref 12.0–15.0)
Hemoglobin: 8.8 g/dL — ABNORMAL LOW (ref 12.0–15.0)
MCH: 27.7 pg (ref 26.0–34.0)
MCH: 29.2 pg (ref 26.0–34.0)
MCH: 29.5 pg (ref 26.0–34.0)
MCHC: 31.9 g/dL (ref 30.0–36.0)
MCHC: 32.4 g/dL (ref 30.0–36.0)
MCHC: 32.7 g/dL (ref 30.0–36.0)
MCHC: 32.8 g/dL (ref 30.0–36.0)
MCV: 87.5 fL (ref 78.0–100.0)
MCV: 88.5 fL (ref 78.0–100.0)
MCV: 88.6 fL (ref 78.0–100.0)
MCV: 92.3 fL (ref 78.0–100.0)
Platelets: 234 10*3/uL (ref 150–400)
Platelets: 246 10*3/uL (ref 150–400)
Platelets: 272 10*3/uL (ref 150–400)
Platelets: 447 10*3/uL — ABNORMAL HIGH (ref 150–400)
Platelets: ADEQUATE 10*3/uL (ref 150–400)
RBC: 2.35 MIL/uL — ABNORMAL LOW (ref 3.87–5.11)
RBC: 2.43 MIL/uL — ABNORMAL LOW (ref 3.87–5.11)
RBC: 2.73 MIL/uL — ABNORMAL LOW (ref 3.87–5.11)
RBC: 2.8 MIL/uL — ABNORMAL LOW (ref 3.87–5.11)
RBC: 3.07 MIL/uL — ABNORMAL LOW (ref 3.87–5.11)
RBC: 3.46 MIL/uL — ABNORMAL LOW (ref 3.87–5.11)
RBC: 3.49 MIL/uL — ABNORMAL LOW (ref 3.87–5.11)
RDW: 15.3 % (ref 11.5–15.5)
RDW: 15.4 % (ref 11.5–15.5)
WBC: 11.5 10*3/uL — ABNORMAL HIGH (ref 4.0–10.5)
WBC: 13.4 10*3/uL — ABNORMAL HIGH (ref 4.0–10.5)
WBC: 13.7 10*3/uL — ABNORMAL HIGH (ref 4.0–10.5)
WBC: 14.4 10*3/uL — ABNORMAL HIGH (ref 4.0–10.5)
WBC: 15 10*3/uL — ABNORMAL HIGH (ref 4.0–10.5)
WBC: 16.6 10*3/uL — ABNORMAL HIGH (ref 4.0–10.5)
WBC: 9.8 10*3/uL (ref 4.0–10.5)

## 2010-11-17 LAB — BASIC METABOLIC PANEL
BUN: 54 mg/dL — ABNORMAL HIGH (ref 6–23)
BUN: 61 mg/dL — ABNORMAL HIGH (ref 6–23)
CO2: 22 mEq/L (ref 19–32)
CO2: 25 mEq/L (ref 19–32)
CO2: 27 mEq/L (ref 19–32)
Calcium: 8.6 mg/dL (ref 8.4–10.5)
Calcium: 9.3 mg/dL (ref 8.4–10.5)
Calcium: 9.3 mg/dL (ref 8.4–10.5)
Calcium: 9.5 mg/dL (ref 8.4–10.5)
Creatinine, Ser: 13.55 mg/dL — ABNORMAL HIGH (ref 0.4–1.2)
Creatinine, Ser: 7.27 mg/dL — ABNORMAL HIGH (ref 0.4–1.2)
GFR calc Af Amer: 11 mL/min — ABNORMAL LOW (ref >60)
GFR calc Af Amer: 7 mL/min — ABNORMAL LOW (ref >60)
GFR calc non Af Amer: 3 mL/min — ABNORMAL LOW (ref >60)
GFR calc non Af Amer: 4 mL/min — ABNORMAL LOW (ref >60)
GFR calc non Af Amer: 9 mL/min — ABNORMAL LOW (ref >60)
Glucose, Bld: 128 mg/dL — ABNORMAL HIGH (ref 70–99)
Glucose, Bld: 135 mg/dL — ABNORMAL HIGH (ref 70–99)
Glucose, Bld: 168 mg/dL — ABNORMAL HIGH (ref 70–99)
Potassium: 4.4 mEq/L (ref 3.5–5.1)
Sodium: 135 mEq/L (ref 135–145)
Sodium: 137 mEq/L (ref 135–145)

## 2010-11-17 LAB — DIFFERENTIAL
Basophils Absolute: 0 10*3/uL (ref 0.0–0.1)
Eosinophils Absolute: 0.3 10*3/uL (ref 0.0–0.7)
Eosinophils Relative: 3 % (ref 0–5)

## 2010-11-17 LAB — GLUCOSE, CAPILLARY
Glucose-Capillary: 113 mg/dL — ABNORMAL HIGH (ref 70–99)
Glucose-Capillary: 119 mg/dL — ABNORMAL HIGH (ref 70–99)
Glucose-Capillary: 122 mg/dL — ABNORMAL HIGH (ref 70–99)
Glucose-Capillary: 123 mg/dL — ABNORMAL HIGH (ref 70–99)
Glucose-Capillary: 134 mg/dL — ABNORMAL HIGH (ref 70–99)
Glucose-Capillary: 139 mg/dL — ABNORMAL HIGH (ref 70–99)
Glucose-Capillary: 143 mg/dL — ABNORMAL HIGH (ref 70–99)
Glucose-Capillary: 144 mg/dL — ABNORMAL HIGH (ref 70–99)
Glucose-Capillary: 147 mg/dL — ABNORMAL HIGH (ref 70–99)
Glucose-Capillary: 150 mg/dL — ABNORMAL HIGH (ref 70–99)
Glucose-Capillary: 154 mg/dL — ABNORMAL HIGH (ref 70–99)
Glucose-Capillary: 160 mg/dL — ABNORMAL HIGH (ref 70–99)
Glucose-Capillary: 164 mg/dL — ABNORMAL HIGH (ref 70–99)
Glucose-Capillary: 169 mg/dL — ABNORMAL HIGH (ref 70–99)
Glucose-Capillary: 172 mg/dL — ABNORMAL HIGH (ref 70–99)
Glucose-Capillary: 190 mg/dL — ABNORMAL HIGH (ref 70–99)
Glucose-Capillary: 193 mg/dL — ABNORMAL HIGH (ref 70–99)
Glucose-Capillary: 193 mg/dL — ABNORMAL HIGH (ref 70–99)
Glucose-Capillary: 196 mg/dL — ABNORMAL HIGH (ref 70–99)
Glucose-Capillary: 196 mg/dL — ABNORMAL HIGH (ref 70–99)
Glucose-Capillary: 261 mg/dL — ABNORMAL HIGH (ref 70–99)
Glucose-Capillary: 365 mg/dL — ABNORMAL HIGH (ref 70–99)
Glucose-Capillary: 71 mg/dL (ref 70–99)
Glucose-Capillary: 77 mg/dL (ref 70–99)
Glucose-Capillary: 99 mg/dL (ref 70–99)

## 2010-11-17 LAB — CULTURE, BLOOD (ROUTINE X 2)
Culture  Setup Time: 201109210210
Culture  Setup Time: 201109211734
Culture  Setup Time: 201109221450
Culture: NO GROWTH
Culture: NO GROWTH

## 2010-11-17 LAB — RENAL FUNCTION PANEL
Albumin: 2 g/dL — ABNORMAL LOW (ref 3.5–5.2)
Albumin: 2.1 g/dL — ABNORMAL LOW (ref 3.5–5.2)
Albumin: 2.4 g/dL — ABNORMAL LOW (ref 3.5–5.2)
Albumin: 2.5 g/dL — ABNORMAL LOW (ref 3.5–5.2)
BUN: 23 mg/dL (ref 6–23)
BUN: 45 mg/dL — ABNORMAL HIGH (ref 6–23)
BUN: 52 mg/dL — ABNORMAL HIGH (ref 6–23)
BUN: 73 mg/dL — ABNORMAL HIGH (ref 6–23)
BUN: 78 mg/dL — ABNORMAL HIGH (ref 6–23)
CO2: 21 mEq/L (ref 19–32)
CO2: 24 mEq/L (ref 19–32)
CO2: 28 mEq/L (ref 19–32)
Calcium: 9.1 mg/dL (ref 8.4–10.5)
Calcium: 9.1 mg/dL (ref 8.4–10.5)
Chloride: 90 mEq/L — ABNORMAL LOW (ref 96–112)
Chloride: 93 mEq/L — ABNORMAL LOW (ref 96–112)
Chloride: 93 mEq/L — ABNORMAL LOW (ref 96–112)
Chloride: 93 mEq/L — ABNORMAL LOW (ref 96–112)
Chloride: 95 mEq/L — ABNORMAL LOW (ref 96–112)
Chloride: 97 mEq/L (ref 96–112)
Creatinine, Ser: 12.09 mg/dL — ABNORMAL HIGH (ref 0.4–1.2)
Creatinine, Ser: 16.29 mg/dL — ABNORMAL HIGH (ref 0.4–1.2)
GFR calc Af Amer: 3 mL/min — ABNORMAL LOW (ref >60)
GFR calc Af Amer: 4 mL/min — ABNORMAL LOW (ref >60)
GFR calc Af Amer: 6 mL/min — ABNORMAL LOW (ref >60)
GFR calc Af Amer: 6 mL/min — ABNORMAL LOW (ref >60)
GFR calc non Af Amer: 2 mL/min — ABNORMAL LOW (ref >60)
GFR calc non Af Amer: 3 mL/min — ABNORMAL LOW (ref >60)
GFR calc non Af Amer: 5 mL/min — ABNORMAL LOW (ref >60)
GFR calc non Af Amer: 5 mL/min — ABNORMAL LOW (ref >60)
Glucose, Bld: 119 mg/dL — ABNORMAL HIGH (ref 70–99)
Glucose, Bld: 146 mg/dL — ABNORMAL HIGH (ref 70–99)
Glucose, Bld: 221 mg/dL — ABNORMAL HIGH (ref 70–99)
Phosphorus: 12.7 mg/dL (ref 2.3–4.6)
Potassium: 3.2 mEq/L — ABNORMAL LOW (ref 3.5–5.1)
Potassium: 3.3 mEq/L — ABNORMAL LOW (ref 3.5–5.1)
Potassium: 3.7 mEq/L (ref 3.5–5.1)
Potassium: 4.2 mEq/L (ref 3.5–5.1)
Potassium: 4.5 mEq/L (ref 3.5–5.1)
Sodium: 134 mEq/L — ABNORMAL LOW (ref 135–145)
Sodium: 135 mEq/L (ref 135–145)
Sodium: 137 mEq/L (ref 135–145)

## 2010-11-17 LAB — IRON AND TIBC
Iron: 15 ug/dL — ABNORMAL LOW (ref 42–135)
TIBC: 146 ug/dL — ABNORMAL LOW (ref 250–470)
UIBC: 131 ug/dL

## 2010-11-17 LAB — CROSSMATCH

## 2010-11-17 LAB — URINE CULTURE

## 2010-11-17 LAB — URINALYSIS, MICROSCOPIC ONLY
Nitrite: NEGATIVE
Specific Gravity, Urine: 1.019 (ref 1.005–1.030)
pH: 6.5 (ref 5.0–8.0)

## 2010-11-17 LAB — MRSA PCR SCREENING: MRSA by PCR: NEGATIVE

## 2010-11-17 LAB — PHOSPHORUS
Phosphorus: 5.6 mg/dL — ABNORMAL HIGH (ref 2.3–4.6)
Phosphorus: 7.4 mg/dL — ABNORMAL HIGH (ref 2.3–4.6)

## 2010-11-17 LAB — CK: Total CK: 2166 U/L — ABNORMAL HIGH (ref 7–177)

## 2010-11-18 NOTE — Assessment & Plan Note (Signed)
OFFICE VISIT  DEBORHA, MOSELEY DOB:  [birth year]                                       11/17/2010 [id number]#:[id number]  The patient returns for followup today.  She previously had a below knee amputation on 07/04/2010.  She has had some difficulty healing this up and returns for further evaluation of the wound.  Of note, she also had a new left upper arm graft placed on 10/03/2010.  She has continued to do wet-to-dry dressings of her below knee amputation.  This is being done by her daughter.  PHYSICAL EXAM:  Vital signs:  Today blood pressure is 191/101 in the right arm, heart rate 92 and regular.  There is an audible bruit in the left upper arm graft.  In the left BKA there is a 5 mm x 2 mm opening on the medial aspect of the below knee amputation.  Overall this is clean with good granulation tissue.  I believe the BKA is almost healed at this point.  She will return for followup in 6 weeks' time to make sure that the wound has completely healed.  She should be able to start using her left upper arm graft soon.  I wrote her a new prescription for a new shrinker for her left below knee amputation today as the elastic had worn out.  She will follow up with me in 6 weeks' time.    [name]. Fields, MD Electronically Signed  CEF/MEDQ  D:  11/17/2010  T:  11/18/2010  Job:  [id number]  cc:   [facility]

## 2010-11-22 LAB — RENAL FUNCTION PANEL
Albumin: 3 g/dL — ABNORMAL LOW (ref 3.5–5.2)
BUN: 23 mg/dL (ref 6–23)
BUN: 28 mg/dL — ABNORMAL HIGH (ref 6–23)
BUN: 59 mg/dL — ABNORMAL HIGH (ref 6–23)
CO2: 25 mEq/L (ref 19–32)
CO2: 30 mEq/L (ref 19–32)
Calcium: 9.2 mg/dL (ref 8.4–10.5)
Calcium: 9.9 mg/dL (ref 8.4–10.5)
Chloride: 95 mEq/L — ABNORMAL LOW (ref 96–112)
Chloride: 99 mEq/L (ref 96–112)
Creatinine, Ser: 12.69 mg/dL — ABNORMAL HIGH (ref 0.4–1.2)
Creatinine, Ser: 6.68 mg/dL — ABNORMAL HIGH (ref 0.4–1.2)
Creatinine, Ser: 8.05 mg/dL — ABNORMAL HIGH (ref 0.4–1.2)
GFR calc non Af Amer: 5 mL/min — ABNORMAL LOW (ref >60)
Glucose, Bld: 146 mg/dL — ABNORMAL HIGH (ref 70–99)
Glucose, Bld: 235 mg/dL — ABNORMAL HIGH (ref 70–99)
Glucose, Bld: 97 mg/dL (ref 70–99)
Phosphorus: 6.2 mg/dL — ABNORMAL HIGH (ref 2.3–4.6)

## 2010-11-22 LAB — BASIC METABOLIC PANEL
BUN: 49 mg/dL — ABNORMAL HIGH (ref 6–23)
Calcium: 8.6 mg/dL (ref 8.4–10.5)
GFR calc Af Amer: 4 mL/min — ABNORMAL LOW (ref >60)
GFR calc Af Amer: 4 mL/min — ABNORMAL LOW (ref >60)
GFR calc non Af Amer: 3 mL/min — ABNORMAL LOW (ref >60)
GFR calc non Af Amer: 3 mL/min — ABNORMAL LOW (ref >60)
Potassium: 3.9 mEq/L (ref 3.5–5.1)
Sodium: 138 mEq/L (ref 135–145)
Sodium: 140 mEq/L (ref 135–145)

## 2010-11-22 LAB — POCT I-STAT 3, ART BLOOD GAS (G3+)
Acid-Base Excess: 7 mmol/L — ABNORMAL HIGH (ref 0.0–2.0)
Bicarbonate: 24.9 mEq/L — ABNORMAL HIGH (ref 20.0–24.0)
Bicarbonate: 29.7 mEq/L — ABNORMAL HIGH (ref 20.0–24.0)
Patient temperature: 98
pH, Arterial: 7.399 (ref 7.350–7.400)
pH, Arterial: 7.526 — ABNORMAL HIGH (ref 7.350–7.400)

## 2010-11-22 LAB — POCT I-STAT, CHEM 8
Creatinine, Ser: 11.2 mg/dL — ABNORMAL HIGH (ref 0.4–1.2)
HCT: 40 % (ref 36.0–46.0)
Hemoglobin: 13.6 g/dL (ref 12.0–15.0)
Potassium: 3.9 mEq/L (ref 3.5–5.1)
Sodium: 138 mEq/L (ref 135–145)

## 2010-11-22 LAB — CARDIAC PANEL(CRET KIN+CKTOT+MB+TROPI)
CK, MB: 1.2 ng/mL (ref 0.3–4.0)
CK, MB: 2.1 ng/mL (ref 0.3–4.0)
Relative Index: INVALID (ref 0.0–2.5)
Relative Index: INVALID (ref 0.0–2.5)
Total CK: 119 U/L (ref 7–177)
Total CK: 70 U/L (ref 7–177)
Troponin I: 0.12 ng/mL — ABNORMAL HIGH (ref 0.00–0.06)
Troponin I: 0.2 ng/mL — ABNORMAL HIGH (ref 0.00–0.06)
Troponin I: 0.29 ng/mL — ABNORMAL HIGH (ref 0.00–0.06)

## 2010-11-22 LAB — CK TOTAL AND CKMB (NOT AT ARMC): Relative Index: 1.4 (ref 0.0–2.5)

## 2010-11-22 LAB — GLUCOSE, CAPILLARY
Glucose-Capillary: 141 mg/dL — ABNORMAL HIGH (ref 70–99)
Glucose-Capillary: 162 mg/dL — ABNORMAL HIGH (ref 70–99)
Glucose-Capillary: 195 mg/dL — ABNORMAL HIGH (ref 70–99)
Glucose-Capillary: 200 mg/dL — ABNORMAL HIGH (ref 70–99)
Glucose-Capillary: 209 mg/dL — ABNORMAL HIGH (ref 70–99)
Glucose-Capillary: 238 mg/dL — ABNORMAL HIGH (ref 70–99)
Glucose-Capillary: 301 mg/dL — ABNORMAL HIGH (ref 70–99)

## 2010-11-22 LAB — CBC
HCT: 28.8 % — ABNORMAL LOW (ref 36.0–46.0)
HCT: 31.9 % — ABNORMAL LOW (ref 36.0–46.0)
HCT: 37.2 % (ref 36.0–46.0)
Hemoglobin: 10.8 g/dL — ABNORMAL LOW (ref 12.0–15.0)
Hemoglobin: 11.3 g/dL — ABNORMAL LOW (ref 12.0–15.0)
Hemoglobin: 9.8 g/dL — ABNORMAL LOW (ref 12.0–15.0)
MCHC: 33.5 g/dL (ref 30.0–36.0)
MCHC: 33.9 g/dL (ref 30.0–36.0)
MCHC: 34.2 g/dL (ref 30.0–36.0)
MCV: 87.7 fL (ref 78.0–100.0)
MCV: 88.1 fL (ref 78.0–100.0)
MCV: 88.7 fL (ref 78.0–100.0)
Platelets: 272 10*3/uL (ref 150–400)
Platelets: 447 10*3/uL — ABNORMAL HIGH (ref 150–400)
RBC: 3.62 MIL/uL — ABNORMAL LOW (ref 3.87–5.11)
RBC: 3.82 MIL/uL — ABNORMAL LOW (ref 3.87–5.11)
RBC: 4.11 MIL/uL (ref 3.87–5.11)
RDW: 15.9 % — ABNORMAL HIGH (ref 11.5–15.5)
RDW: 15.9 % — ABNORMAL HIGH (ref 11.5–15.5)
RDW: 16.1 % — ABNORMAL HIGH (ref 11.5–15.5)
WBC: 10 10*3/uL (ref 4.0–10.5)
WBC: 24.1 10*3/uL — ABNORMAL HIGH (ref 4.0–10.5)
WBC: 8.4 10*3/uL (ref 4.0–10.5)

## 2010-11-22 LAB — DIFFERENTIAL
Eosinophils Relative: 3 % (ref 0–5)
Lymphs Abs: 10.6 10*3/uL — ABNORMAL HIGH (ref 0.7–4.0)
Monocytes Relative: 8 % (ref 3–12)

## 2010-11-22 LAB — PHOSPHORUS: Phosphorus: 7.9 mg/dL — ABNORMAL HIGH (ref 2.3–4.6)

## 2010-11-22 LAB — MAGNESIUM: Magnesium: 2.4 mg/dL (ref 1.5–2.5)

## 2010-11-22 LAB — BRAIN NATRIURETIC PEPTIDE: Pro B Natriuretic peptide (BNP): 1450 pg/mL — ABNORMAL HIGH (ref 0.0–100.0)

## 2010-12-09 ENCOUNTER — Ambulatory Visit (HOSPITAL_COMMUNITY): Payer: Medicare Other | Attending: Vascular Surgery

## 2010-12-09 DIAGNOSIS — N186 End stage renal disease: Secondary | ICD-10-CM | POA: Insufficient documentation

## 2010-12-09 DIAGNOSIS — Z452 Encounter for adjustment and management of vascular access device: Secondary | ICD-10-CM

## 2010-12-09 DIAGNOSIS — I120 Hypertensive chronic kidney disease with stage 5 chronic kidney disease or end stage renal disease: Secondary | ICD-10-CM

## 2010-12-29 ENCOUNTER — Ambulatory Visit (INDEPENDENT_AMBULATORY_CARE_PROVIDER_SITE_OTHER): Payer: Medicare Other | Admitting: Vascular Surgery

## 2010-12-29 DIAGNOSIS — I70219 Atherosclerosis of native arteries of extremities with intermittent claudication, unspecified extremity: Secondary | ICD-10-CM

## 2010-12-30 NOTE — Assessment & Plan Note (Signed)
OFFICE VISIT  DAYTONA, HEDMAN DOB:  1961-05-14                                       12/29/2010 [id number]#:[id number]  The patient returns for followup today.  She previously had a left below knee amputation October 31 which we have been following along.  It has finally healed at this point.  She also recently had a new left upper arm graft placed 10/03/2010.  She says they are cannulating this without difficulty.  PHYSICAL EXAM:  Today the left below knee amputation is well-healed. The medial aspect is completely epithelialized at this point.  She is being fitted for a prosthetic currently.  She will follow up with me on an as-needed basis.    [name]. Maniah Nading, MD Electronically Signed  CEF/MEDQ  D:  12/29/2010  T:  12/30/2010  Job:  [phone]

## 2011-01-11 ENCOUNTER — Ambulatory Visit: Payer: Medicare Other | Attending: Physical Medicine & Rehabilitation | Admitting: Physical Therapy

## 2011-01-11 DIAGNOSIS — R269 Unspecified abnormalities of gait and mobility: Secondary | ICD-10-CM | POA: Insufficient documentation

## 2011-01-11 DIAGNOSIS — R5381 Other malaise: Secondary | ICD-10-CM | POA: Insufficient documentation

## 2011-01-11 DIAGNOSIS — M6281 Muscle weakness (generalized): Secondary | ICD-10-CM | POA: Insufficient documentation

## 2011-01-11 DIAGNOSIS — S78119A Complete traumatic amputation at level between unspecified hip and knee, initial encounter: Secondary | ICD-10-CM | POA: Insufficient documentation

## 2011-01-11 DIAGNOSIS — IMO0001 Reserved for inherently not codable concepts without codable children: Secondary | ICD-10-CM | POA: Insufficient documentation

## 2011-01-17 NOTE — Assessment & Plan Note (Signed)
OFFICE VISIT   ANJOLAOLUWA, SIGUENZA  DOB:  1961/08/24                                       09/08/2010  ZOXWR#:[id number]   The patient returns for followup today after recent right above knee  amputation and left below knee amputation.  Her right above knee  amputation is well-healed.  The left below knee amputation which was  done on October 31 still is in various stages of healing and has dry  eschar on the edge.  Some of this was debrided away today.  There still  appears to be good healing tissue below this.  The medial aspect has a  slightly deeper wound but most of the lateral and medial aspects of the  BKA are now completely healed.  A shrinker was prescribed for her above  knee amputation and her below knee amputation today.  She will follow up  with me again in 3 weeks' time.  She is probably still going to need  some further debridement on the left BKA but I believe overall this is  going to heal.     [name]. Fields, MD  Electronically Signed   CEF/MEDQ  D:  09/08/2010  T:  09/09/2010  Job:  [id number]   cc:   Duke Salvia. Eliott Nine, M.D.

## 2011-01-17 NOTE — Assessment & Plan Note (Signed)
OFFICE VISIT   [name], [name]  DOB:  09-15-1960                                       08/18/2010  ZOXWR#:[id number]   DATE OF SURGERY:  07/18/2010 consisting of a right above-knee  amputation.   Patient returns to clinic today for evaluation of her left below-knee  amputation, which was performed on 07/04/2010.  She states that the home  health nurse saw it several days ago and felt that it was looking worse  and felt that she should have it evaluated.  Of note, she does have an  appointment set up to see Dr. Darrick Penna next week.   Physical findings revealed a well-nourished female in no distress.  Heart rate was 88, blood pressure was 128/91, O2 sat was 100%.  HEENT:  PERRLA, EOMI.  Lungs were clear.  Cardiac exam revealed a regular rate  and rhythm.  The abdomen was soft, nontender.  Musculoskeletal exam  demonstrated a right AKA and left BKA.  There was no focal weaknesses or  paresthesias.  Attention was then turned to her left BKA.  The dressing  was taken down.  There was little change appreciated in her wound at  this time.  I did ask Dr. Darrick Penna for his assistance in evaluating this  patient.  He entered the room and examined her left BKA.  He felt that  it is continuing to heal well and stated that he would like to see her  in the next several weeks.   She will be keeping her regularly scheduled appointment with him on  Thursday.   Wilmon Arms, PA   Paula Hora. Fields, MD  Electronically Signed   KEL/MEDQ  D:  08/18/2010  T:  08/18/2010  Job:  [id number]

## 2011-01-17 NOTE — Assessment & Plan Note (Signed)
OFFICE VISIT   Paula, [name]  DOB:  December 20, 1960                                       08/25/2010  DGLOV#:[id number]   The patient returns for followup today.  She previously underwent a left  below knee amputation on October 31 and has had some difficulty healing  this up.  Her right above knee amputation is well-healed.   On exam today blood pressure is 123/78 in the left arm, heart rate is 81  and regular.  Respirations 18.  The left below knee amputation has some  eschar especially medially but it is starting to separate and pull away  and there is granulation tissue beneath this.  Overall the BKA seems  well-perfused at this point.  Will continue to do dry dressings and  protect the left stump.  She will return in two weeks' time.  We may  need to consider some debridement at that time to remove some of the  eschar.  Overall though, however, I am optimistic that the below knee  amputation will heal over time.  This was discussed with the patient and  her daughter today as well.  Also her daughter was given a note today to  explain her mother's situation and why she has been absent from work  recently.     [name]. Fields, MD  Electronically Signed   CEF/MEDQ  D:  08/25/2010  T:  08/26/2010  Job:  [id number]

## 2011-01-17 NOTE — Procedures (Signed)
LOWER EXTREMITY ARTERIAL DUPLEX   INDICATION:  Right toes ache, absent pedal pulses.   HISTORY:  Diabetes:  Yes.  Cardiac:  No.  Hypertension:  Yes.  Smoking:  No.  Previous Surgery:  History of left arm AV fistula.   SINGLE LEVEL ARTERIAL EXAM                          RIGHT                LEFT  Brachial:               153  Anterior tibial:        Not detected         99  Posterior tibial:       94                   Not detected  Peroneal:               Not detected         89  Ankle/Brachial Index:   0.61                 0.65   LOWER EXTREMITY ARTERIAL DUPLEX EXAM   DUPLEX:  Patent bilateral femoral popliteal arterial system with  hemodynamically significant stenoses of the bilateral superficial  femoral arteries distally.   IMPRESSION:  1. Hemodynamically significant stenoses of the bilateral distal      superficial femoral arteries noted.  2. Moderately decreased bilateral ankle brachial indices.  A PPG      waveform was not adequately detected in the right great toe with a      dampened PPG waveform noted in the left great toe.   ___________________________________________  Quita Skye. Hart Rochester, M.D.   CH/MEDQ  D:  05/19/2010  T:  05/19/2010  Job:  [id number]

## 2011-01-17 NOTE — Assessment & Plan Note (Signed)
OFFICE VISIT   [name], [name]  DOB:  [birth year]                                       08/11/2010  ZOXWR#:[id number]   The patient presents for followup today.  She recently had a right above  knee amputation on 07/18/2010.  She also had a left below knee  amputation on 07/04/2010.  She reports no pain in either amputation at  this point.  She has had no drainage and no fevers.   PHYSICAL EXAM:  Blood pressure is 157/81 in the right arm, temperature  is 98.2, heart rate is 85 and regular.  Right above knee amputation is  well-healed.  Staples were removed from this today.  The left below knee  amputation is continuing to heal.  There is some dry eschar at the skin  edges but there is no significant drainage, no erythema and I do believe  this should continue to heal over time.  All staples were removed from  this today as well.  She will follow up with me in two weeks' time to  recheck the left BKA incision.  She will continue to place dry dressings  on this and clean it once daily with soap and water.     [name]. Fields, MD  Electronically Signed   CEF/MEDQ  D:  08/11/2010  T:  08/12/2010  Job:  [id number]   cc:   Duke Salvia. Eliott Nine, M.D.  Dr Hermelinda Medicus

## 2011-01-17 NOTE — Discharge Summary (Signed)
[name], [name]              ACCOUNT NO.:  [id number]   MEDICAL RECORD NO.:  [id number]          PATIENT TYPE:  INP   LOCATION:  6711                         FACILITY:  [facility]   PHYSICIAN:  Terrial Rhodes, M.D.DATE OF BIRTH:  1960/10/13   DATE OF ADMISSION:  06/23/2008  DATE OF DISCHARGE:  06/26/2008                               DISCHARGE SUMMARY   ADMITTING DIAGNOSES:  A 50 year old black female with end-stage renal  disease secondary to diabetes and hypertension, on chronic dialysis  every Tuesday, Thursday, and Saturday at the [facility], presents with sudden onset shortness of breath that was  increased throughout the day.  She was last dialyzed on June 21, 2008, and left below her dry weight.  She denies chest pain or vomiting,  but does have occasional nausea.  She denies fever or chills.   LABORATORY DATA ON ADMISSION:  Sodium 132, potassium 4.2, chloride 104,  BUN 57, and creatinine 13.4.  White count 16,400, hemoglobin 13.5, and  platelets 379,000.  Myoglobin 441.   HOSPITAL COURSE:  1. Flash pulmonary edema.  Upon admission, the patient underwent      emergency dialysis.  No weights were obtained pre or post.      However, 2.65 liters of ultra-filtrated off with the lowest blood      pressure at the end of treatment recorded as 80/61.  It is notable      that her pre-dialysis blood pressure was 223/127.  Other workup      included a TSH of 2.874.  Cardiac enzymes were negative for an      acute MI.  Urinary drug screen was negative.  Chest x-ray on      June 24, 2008, showed interval improvement in congestive heart      failure and bilateral edema.  She dialyzed again on June 25, 2008.  A postdialysis stand-up scale weight was at 64.0.  [facility], which she has been attaining and      leaving below.  Her new dry weight [facility] will be at      62.5.  We will follow up her post  weights [facility].   Echocardiogram done on June 24, 2008, showed an ejection fraction of  40-45% with mild diffuse left ventricular hypokinesis, left ventricular  wall thickness was at the upper limits of normal.  Overall function was  mildly to moderately increased.  Results were discussed with Dr. Donia Guiles who felt that a stress test was not indicated at this time.  The  patient had normal coronaries on cardiac catheterization from October  2002.  He will follow up with the patient.   With aggressive ultrafiltration on dialysis, antihypertensive  medications were tapered.  At the time of discharge, her only  cardiovascular medications include Toprol-XL 100 mg and lisinopril 40 mg  at bedtime.  Norvasc was stopped.  Her blood pressure on the day of  discharge is approximately 138/84 last checked.  I  strongly encouraged  compliance with medication and fluid restriction pointing out to the  patient how well her blood pressure was controlled here in the hospital.  She will continue dialysis on Tuesday, Thursday, and Saturday at the  [facility].   DISCHARGE MEDICATIONS:  1. Lisinopril decreased to 40 mg nightly.  2. Toprol-XL 100 mg nightly.  3. Lantus 45 units subcu nightly.  4. Sensipar 30 mg daily.  5. Zocor 80 mg q.p.m.  6. Aspirin 325 mg daily.   New dry weight 62.5 kg.  EPO continued at 11,406 units, IV each  dialysis.  The patient is instructed to stop her clonidine patch and  Norvasc, dialysis on Tuesday, Thursday, and Saturday at the [facility].      Zenovia Jordan, P.A.    ______________________________  Terrial Rhodes, M.D.    RRK/MEDQ  D:  06/26/2008  T:  06/27/2008  Job:  [id number]   cc:   Osvaldo Shipper. Spruill, M.D.

## 2011-01-17 NOTE — Assessment & Plan Note (Signed)
OFFICE VISIT   GORDIE, BELVIN C  DOB:  1960-10-07                                       06/23/2010  ZOXWR#:[id number]   [name] returns for follow-up today.  She underwent a right below-  knee amputation on May 25, 2010.  She returns after her rehab stay  today for follow-up.  She complains of worsening rest pain in her left  foot.  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  She is requiring 15 mg of immediate  release morphine 4 times a day to help control the pain and she states  that this is not enough.  She denies any drainage from her right below-  knee amputation.  She is covering this with a dry dressing daily.   On physical exam, blood pressure is 102/70 in the right arm, heart rate  132 and regular, respirations 24.  The right below-knee amputation has  eschar around the skin edges which is dry in character.  There is no  significant drainage.  The eschar extends up to 3 cm in length from the  skin edge in several areas.  Staples are still intact.  The left foot is  cool to palpation and exquisitely tender to touch.  There are no open  ulcerations.   In summary, [name]'s right below-knee amputation still has some  marginal skin edges and I believe this needs debridement and possible  revision of the tibial plateau if this required extensive debridement.  Additionally, she has now consented to a left below knee amputation for  control of rest pain in her left foot.  We will temporarily increase her  pain medication to 30 mg of immediate release morphine every 4 hours.  She is scheduled to have a left below knee amputation on July 04, 2010 with revision of her right below-knee amputation at the same  setting.  Risks, benefits, possible complications and procedure details  were explained to the patient today as similar to her previous  amputation primarily nonhealing of the  wound, bleeding, and infection.  She understands and agrees to proceed.     [name]. Fields, MD  Electronically Signed   CEF/MEDQ  D:  06/23/2010  T:  06/24/2010  Job:  [id number]   cc:   [facility]

## 2011-01-18 ENCOUNTER — Ambulatory Visit: Payer: Medicare Other | Admitting: Physical Therapy

## 2011-01-20 ENCOUNTER — Ambulatory Visit: Payer: Medicare Other | Admitting: Physical Therapy

## 2011-01-20 NOTE — Discharge Summary (Signed)
NAMEIGNACIA, GENTZLER              ACCOUNT NO.:  [id number]   MEDICAL RECORD NO.:  [id number]          PATIENT TYPE:  INP   LOCATION:  6711                         FACILITY:  [facility]   PHYSICIAN:  Terrial Rhodes, M.D.DATE OF BIRTH:  Oct 18, 1960   DATE OF ADMISSION:  06/23/2008  DATE OF DISCHARGE:  06/26/2008                               DISCHARGE SUMMARY   ADDENDUM   DISCHARGE DIAGNOSES:  1. Pulmonary edema noncardiac with an ejection fraction of 40-50%.  2. Urgent hypertension.  3. Diabetes mellitus.  4. Anemia of chronic disease.  5. End-stage renal disease.  6. Secondary hyperparathyroidism.           ______________________________  Terrial Rhodes, M.D.     JC/MEDQ  D:  08/05/2008  T:  08/06/2008  Job:  [id number]

## 2011-01-20 NOTE — Op Note (Signed)
NAME:  [name], [name]                        ACCOUNT NO.:  [id number]   MEDICAL RECORD NO.:  [id number]                   PATIENT TYPE:  INP   LOCATION:  3706                                 FACILITY:  [facility]   PHYSICIAN:  Balinda Quails, M.D.                 DATE OF BIRTH:  10/05/1960   DATE OF PROCEDURE:  12/10/2003  DATE OF DISCHARGE:                                 OPERATIVE REPORT   PREOPERATIVE DIAGNOSIS:  End-stage renal failure.   POSTOPERATIVE DIAGNOSIS:  End-stage renal failure.   PROCEDURE:  1. Left brachiocephalic Janeal Holmes) arteriovenous fistula.  2. Ultrasound, right neck.  3. Insertion of right internal jugular Diatek catheter.   Agent:  Balinda Quails, M.D.   ASSISTANT:  Pecola Leisure, PA.   ANESTHESIA:  Local with MAC.   OPERATIVE PROCEDURE:  The patient was brought to the operating room in  stable condition, placed in supine position.   Left arm prepped and draped in a sterile fashion.  The skin and subcutaneous  tissue instilled with 1% Xylocaine.   A longitudinal skin incision was made over the anatomical snuffbox.  Dissection carried down through subcutaneous tissue.  The cephalic vein  identified.  This was very small, approximately 2 mm in size, not felt to be  adequate for a Cimino fistula.   Attention was then placed on the antecubital fossa.  A transverse skin  incision was made through the antecubital fossa.  Dissection carried down to  expose the antecubital veins.  The cephalic vein was 4 mm in size.  This  appeared to be adequate vein for placement of a Kaufmann fistula.  The vein  was mobilized, tributaries ligated with 4-0 silk and divided.  The vein  ligated distally with 2-0 silk and divided.  The brachial artery then  exposed and controlled proximally and distally with serrefine clamps.   A longitudinal arteriotomy made.  The vein dilated up to 4 mm.  The vein  anastomosed end-to-side to the brachial artery using running 6-0  Prolene  suture.  Clamps were then removed.  Excellent flow was present.  Adequate  hemostasis obtained.   Sponge and instrument counts were correct.   Subcutaneous tissue then closed with running 3-0 Vicryl suture.  Skin closed  with 4-0 Monocryl. Steri-Strips applied.  Sterile dressing applied to the  left arm.   The right neck and chest then prepped and draped in a sterile fashion.  An  ultrasound of the right internal jugular vein revealed normal  compressibility and respiratory variation.  The skin and subcutaneous  tissues instilled with 1% Xylocaine.  The needle was easily introduced into  the right internal jugular vein.  An 0.035 J-wire was passed through the  needle into the superior vena cava under fluoroscopy.  The guide wire site  was opened with an 11 blade.  Dilators, 12, 14 to 16 advanced over the guide  wire.  The 16 dilator and tear-away sheath were advanced over the guide  wire.  The guide wire and dilator were removed.  Diatek catheter was placed  through the sheath and the sheath removed.  The catheter positioned at the  superior vena cava-right atrial junction.  The catheter was then brought  through the subcutaneous tissue to the right supraclavicular fossa.  The hub  mechanism was assembled.  The catheter was flushed with heparin and saline  solution and capped with heparin.   The skin then closed with interrupted 3-0 nylon suture.  The catheter was  affixed to the skin with interrupted 2-0 silk suture.  Sterile dressings  were applied.   The patient tolerated the procedure well, was transferred to the recovery  room in stable condition.                                               Balinda Quails, M.D.    PGH/MEDQ  D:  12/10/2003  T:  12/10/2003  Job:  [id number]

## 2011-01-20 NOTE — Discharge Summary (Signed)
[place]  Patient:    [name], [name]                     MRN: [id number] Adm. Date:  [id number] Attending:  Pola Corn Dictator:   Anselm Jungling, N.P.                           Discharge Summary  ADMISSION DIAGNOSES: 1.  Severe hypertension. 2.  Dyspnea. 3.  Congestive heart failure.  HOSPITAL COURSE:  During the course of Ms. Surgeons admission, she was given Lopressor which brought blood pressure down.  Vital signs presently, blood pressure 156/96, pulse 104, temperature 97.6, oxygen saturation 98% on room air.  Blood sugar 201.  Ms. Surgeons condition has stabilized and she will be discharged today.  MEDICATIONS:  Lopressor 50 mg 1 p.o. q.d., Norvasc 10 mg 1 p.o. q.d., Lasix 40 mg p.o. q.d., potassium chloride 20 mEq 1 p.o. t.i.d., Avandia 2 mg 1 p.o. q.d.  DISCHARGE INSTRUCTIONS:  [name] is presently unemployed and does to have any health insurance, so a referral to help her will be made.  The patient is to go by Dr. Ronie Spies office to get any samples that are available to her. The patient is to follow with Dr. Shana Chute July 23, 2000 at 1:30 p.m. DD:  07/17/00 TD:  07/17/00 Job: [id number]

## 2011-01-20 NOTE — Consult Note (Signed)
NAMESIMRANJIT, [name]                        ACCOUNT NO.:  [id number]   MEDICAL RECORD NO.:  [id number]                   PATIENT TYPE:  INP   LOCATION:  3706                                 FACILITY:  [facility]   PHYSICIAN:  Alvira Philips, M.D.                DATE OF BIRTH:  Oct 11, 1960   DATE OF CONSULTATION:  12/08/2003  DATE OF DISCHARGE:  12/13/2003                                   CONSULTATION   CHIEF COMPLAINT:  Increased creatinine.   HISTORY OF PRESENT ILLNESS:  This is a 50 year old African American female  with known malignant hypertension, CHF, diabetes, and chronic renal  insufficiency and chronic anemia who presented to the hospital three days  ago with CHF exacerbation and increased diastolic blood pressure.  She has  been on multiple blood pressure medications in the past with known  noncompliance.  Her initial creatinine at the time of presentation was 6.6  with good urine output.  Over the last few days her creatinine has risen to  7.8 with aggressive diuresis.  Her urine out still remains good and her  creatinine was known to be approximately 5 on November 2004 on her previous  admission.  The patient states now that her breathing is much improved.  She  denies any drug, NSAID, or alcohol use.  She denies any further chest pain  or shortness of breath but has had some mild palpitations.  She denies being  told that she has ever had any history of chronic renal insufficiency.   PAST MEDICAL HISTORY:  1. Chronic renal insufficiency, baseline of 5.0 in November of 2004.  2. Hypertension.  3. Diastolic dysfunction.  4. Hypertensive cardiac disease.  5. CHF.  6. Diastolic dysfunction.  7. EF of 45% on October 2002 with normal coronaries.  8. Diabetes mellitus.  She is taking Lantus.  Her last A1C in October 2004,     was 5.4.  9. Increased cholesterol.  Increased LDL noted to be 167, HDL found to be     32.  10.      Anemia.  She has a baseline hemoglobin of 8.7.  11.      Obesity.  12.      Gout.  13.      She is status post hysterectomy.   MEDICATIONS ON ADMISSION:  1. Coreg 25 mg p.o. b.i.d.  2. Hydralazine 50 mg q.8h.  3. Imdur 120 mg p.o. daily.  4. Clonidine 0.2 mg p.o. b.i.d.  5. Norvasc 7 mg p.o. daily.  6. Lasix 40 mg p.o. daily.  7. Lipitor 20 mg p.o. daily.  8. Lantus 20 units subcu q.h.s.  9. Allopurinol 100 mg p.o. daily.   ALLERGIES:  No known drug allergies.   SOCIAL HISTORY:  She lives with her husband here in [place], [place].  She does not smoke, she does not use alcohol.  She does not use  any  drugs.  She is currently employed.   FAMILY HISTORY:  Mother died secondary to renal failure with diabetes and  hypertension.  Never received dialysis.  Other members of her family her  known to have hypertensive disease.   PHYSICAL EXAMINATION:  GENERAL APPEARANCE:  She is alert and oriented, in no  acute distress.  VITAL SIGNS:  Temperature 98.2, pulse 73, respiratory rate 20, blood  pressure 128 to 147/80 to 90.  She is 100% on two liters nasal cannula.  Her  weight today is 70 kg.  CBGs are 120, 105, 133, 122.  Her ins and outs over  the last 24 hours were 740/2650 for a -1910.  Urine output 2300 over the  last 24 hours and 1325 over the last 16 hours.  HEENT:  Pupils are equal, round and reactive to light.  She has some right  eye deviation.  No phlegm, no edema.  Extraocular muscles are intact.  NECK:  Neck is supple with no lymphadenopathy, no JVD.  LUNGS:  Lungs are clear to auscultation bilaterally with good respiratory  effort, except for some mild crackles at the bases.  CARDIOVASCULAR:  She has regular rate and rhythm, no murmurs, rubs, or  gallops.  ABDOMEN:  Her abdomen is obese, soft, nontender and nondistended.  She has  positive bowel sounds. She has a low transverse surgical scar from  hysterectomy.  EXTREMITIES:  Extremities show 2+ DP pulses bilaterally.  She has no lower  extremity edema.  She  has no contractures, she has no tenderness to  palpation.   LABORATORY DATA:  White count 8.3, hemoglobin 8.5, hematocrit 25.0,  platelets 253, and an MCV of 85.7.  Electrolytes show sodium 137, creatinine  4.0, chloride 104, CO2 21, BUN 73, creatinine 7.8, calcium 6.3, corrected  calcium 7.3, alkaline phosphatase 68, albumin 2.7, uric acid 8.6.  A 24-hour  urine shows 1.886 kg of protein over 24 hours. UA shows greater than 300  protein, 11 to 20 WBC, few bacteria.  BNP on admission showed _________ of  23.   Renal ultrasound shows a right kidney with 10.6 cm with left kidney 10.9 cm.  Both kidneys are echogenic and no signs of hydronephrosis.  SPEP and UPEP  are both nonspecific.  Creatinine clearance at this point in time was 10.5.   ASSESSMENT/PLAN:  A 50 year old [place] American female with the following  problems.   1. Acute on chronic renal insufficiency, acute worsening of chronic renal     insufficiency of unknown etiology, likely secondary to aggressive     diuresis.  She still maintains good urine output, somewhat nauseated,     feeling lethargic over the last few days which probably represents uremic     symptoms.  She is currently on Lasix 80 mg q.8h. IV yet increase in     creatinine.  This may represent deterioration of her chronic renal     insufficiency.  Her ARB has been held since last admission.  Her     ultrasound shows no signs of obstruction and SPEP and UPEP are both     nonspecific.  Blood pressure is somewhat better controlled here in the     hospital.  Given the fact that she has increased creatinine and decreased     GFR, likely secondary to heart failure and has mild uremic symptoms, will     go ahead and discuss possibility of starting dialysis on her.  Would     recommend that she gets  Perma-Cath placed temporarily and consult CVTS     for an AV graft shunt.  Will need to control her hypertension and    diabetes.  Feel she is probably an excellent  candidate for a kidney     transplant at some point in the future if able to control these     parameters.  2. Hypocalcemia, likely secondary to chronic renal insufficiency.  Likely     need vitamin D level, PTH, and magnesium levels for analysis.  Will start     calcium supplements at this point in time.  Likely representation of     secondary hyperparathyroidism.  3. Hypertension.  The patient has history of medication noncompliance.  Will     need to control.  Currently on Norvasc, Toprol XL and clonidine right now     in hospitalization.  4. Diabetes mellitus.  Lantus and sliding scale insulin seem to be     controlling her CBGs at this point in time.  5. Anemia.  Likely secondary to chronic renal disease.  Will recommend iron     studies as the patient will probably need iron and EPO on hemodialysis.                                               Alvira Philips, M.D.    RM/MEDQ  D:  12/13/2003  T:  12/14/2003  Job:  [id number]   cc:   Mindi Slicker. Lowell Guitar, M.D.  [address]  [place]  [place] [address]  Fax: [phone]

## 2011-01-20 NOTE — Discharge Summary (Signed)
NAME:  [name], [name]                        ACCOUNT NO.:  [id number]   MEDICAL RECORD NO.:  [id number]                   PATIENT TYPE:  INP   LOCATION:  3706                                 FACILITY:  [facility]   PHYSICIAN:  Osvaldo Shipper. Spruill, M.D.             DATE OF BIRTH:  05/29/1961   DATE OF ADMISSION:  12/05/2003  DATE OF DISCHARGE:  12/13/2003                                 DISCHARGE SUMMARY   DISCHARGE DIAGNOSES:  1. Hypertensive heart and renal disease.  2. Congestive heart failure.  3. Insulin-dependent diabetes mellitus.  4. Obesity.  5. Anemia.  6. Hyperlipidemia.  7. Hypocalcemia.   CONSULTATIONS:  Balinda Quails, M.D.   PROCEDURE:  Dialysis and venous access December 10, 2003 by Dr. Terrial Rhodes.  Hemodialysis on December 11, 2003.   HISTORY OF PRESENT ILLNESS:  This is a 50 year old patient who presented  initially to the emergency department of the [facility]  with a complaint of shortness of breath.  The patient gave history that this  started on the evening prior to admission with increasing shortness of  breath that became particularly bothersome on he day of admission.  The  patient was evaluated in the emergency department at which time the patient  was noted to be extremely anxious, was in mild to moderate respiratory  distress with bilateral rales at the bases.  The pulse oximetry on room air  was noted to be 97%.  The patient was noted to be in congestive heart  failure with BNP that was significantly elevated over 700.  The renal  function was also elevated with the BUN being 48 and the creatinine being  6.6.   The patient was evaluated with cardiac markers and these were negative.  The  complete blood count revealed the hemoglobin to be low at 8.1 and the  hematocrit to be low at 24.1%.   The patient was subsequently admitted for evaluation and treatment of this  particular problem.   HOSPITAL COURSE:  The patient was admitted to  the medical service and placed  on telemetry.  The patient received IV labetalol, IV Lasix initially.  The  patient was also seen by pharmacy for Lovenox protocol for DVT prophylaxis.   After the patient was stabilized, a renal consultation was obtained  especially in view of the patient's creatinine increasing and calcium levels  also increasing.  The renal consultation was answered and it was their  opinion that the patient had end-stage renal disease with mild uremic  symptoms.  The patient was begun on end-stage renal disease education and  screening was begun for AV shunt.   On December 08, 2003, the patient was seen by cardiovascular and thoracic  surgery.  Vein mapping was carried out.  On December 10, 2003, the patient  received an AV fistula and right dialysis catheter without problem.   On December 11, 2003, the patient  underwent hemodialysis with significant  improvement in blood pressure problems.  The diabetes came under better  control with good management.  The patient underwent hemodialysis again on  December 13, 2003 and later that day it was the opinion that the patient had  received maximum benefit from this hospitalization and could be discharged  home without any problems.   DISCHARGE MEDICATIONS:  1. Norvasc 10 mg q.d.  2. Aspirin 1 q.d. 325 mg.  3. Clonidine 0.3 mg 1 tablet b.i.d.  4. Calcium carbonate 1000 mg t.i.d.  5. Lipitor 20 mg q.d.  6. Imdur 120 mg q.d.  7. Lantus 20 units subcutaneously q.d.  8. Allopurinol 100 mg q.d.  9. Epogen 10,000 units three times per week with hemodialysis.  10.      Iron dextran IV 10 treatments starting on December 11, 2003.  11.      Toprol XL 100 mg q.d.   FOLLOW UP:  The patient will be seen at the [facility]  on Tuesday, Thursday, and Saturday.  The patient is to be followed in the  office on Monday, December 14, 2003 or sooner if any changes, problems, or  complications.      [name], P.A.                        Osvaldo Shipper. Spruill, M.D.    [place]  D:  01/05/2004  T:  01/06/2004  Job:  [id number]

## 2011-01-20 NOTE — Discharge Summary (Signed)
[place]  Patient:    [name], [name] Visit Number: [id number] MRN: [id number]          Service Type: MED Location: [phone] Attending Physician:  Ricki Rodriguez Dictated by:   Ricki Rodriguez, M.D. Admit Date:  06/27/2001 Discharge Date: 06/30/2001                             Discharge Summary  REFERRING DOCTOR:  Osvaldo Shipper. Spruill, M.D.  ADMITTING DOCTOR:  Ricki Rodriguez, M.D.  PRINCIPAL DIAGNOSES:  1. Angina.  2. Hypertensive heart disease.  3. Diabetes mellitus type 2.  4. Iron-deficiency anemia.  5. Hypertension.  DISCHARGE MEDICATIONS:  1. Darvocet-N 100 4 times daily.  2. Toprol XL 50 mg daily.  3. Nexium 40 mg daily.  4. Catapres 0.2 mg 3 times daily.  5. Actos 50 mg daily.  6. Lotrel 5/10 one q.d.  7. Lasix 40 mg daily.  8. Aspirin one daily 81 mg.  9. Ferrous sulfate one day, 325 mg 10. Lantus 20 units subcutaneous bed time.  DISCHARGE ACTIVITY:  As tolerated.  DISCHARGE DIET:  Low fat, low salt diet.  WOUND CARE INSTRUCTIONS:  Patient is to notify for right groin pain, swelling or discharge.  FOLLOW UP:  Patient will have follow up with Dr. Orpah Cobb in two weeks. Patient is to call [phone] for appointment.  HISTORY OF PRESENT ILLNESS:  The patient is a 50 year old black female who had substernal chest pain described as hard, intermittent for one day without any nausea, vomiting, sweating spells or shortness of breath.  Chest pain was severe enough to bring tears to her eyes and has had similar chest pain in the past.  Has cardiac risk factors of diabetes mellitus and hypertension.  PHYSICAL EXAMINATION:  VITAL SIGNS:  Temperature 97.9, pulse 72, respirations 15, blood pressure 125/87.  Height 52", weight 151 pounds.  Patient is alert and oriented x 3.  HEENT: Head is normocephalic, atraumatic.  Eyes:  Manson Passey with the left eye somewhat lazy.  Conjunctivae pink, sclerae nonicteric. ENT:  Mucous  membranes pink and moist.  NECK:  No jugular venous distension and no carotid bruits.  LUNGS:  Clear with chest wall mild tenderness, however, this is different than her other pain.  HEART:  Normal S1, S2.  ABDOMEN:  Soft, nontender.  EXTREMITIES:  No edema, cyanosis or clubbing.  CNS:  Cranial nerves II-XII grossly intact.  LABORATORY DATA:  Normal white blood cell count, platelet count, somewhat low hemoglobin of 10.7, hematocrit 30.9.  CARDIAC ENZYMES:  CK 34, troponin-I 0.01.  ELECTROLYTES:  Near normal except for BUN 19, creatinine 1.7.  LIPIDS:  Cholesterol elevated at 282, triglycerides elevated at 365 with HDL cholesterol 42, LDL cholesterol 167.  Iron was low at 32 with 17% saturation and ferritin was elevated at 418.  HOSPITAL COURSE:  The patient was admitted to the telemetry unit and myocardial infarction was ruled out with normal CKs, MBs and troponin-I times two to three.  The patient had anemia work-up due to her low hemoglobin which showed iron-deficiency anemia.  Stool for occult blood was not sent out.  She had a nuclear stress test that showed inferior wall hypokinesia along with inferior wall defect on the nuclear stress test with no prior history of myocardial infarction in the past.  The patient requested additional investigations.  Cardiac catheterization was offered and patient understood the procedure  risks, benefits and alternate use.  She had cardiac catheterization on 06/29/2001 that showed inferior wall and anterior wall hypokinesia with ejection fraction of 45% and essentially unremarkable coronary artery, however, wraparound left anterior descending coronary artery and a very small posterior descending coronary artery from left circumflex coronary artery.  It appeared half of the posterior wall had decreased vasculature with possible decreased blood supply.  Her blood pressures were elevated at 192/91.  MEDICATIONS:  Include an ACE  inhibitor, calcium channel blocker, diuretic, beta blocker, and aspirin.  Hence, the patient was discharged home in satisfactory condition with follow up by me in two weeks. Dictated by:   Ricki Rodriguez, M.D. Attending Physician:  Ricki Rodriguez DD:  08/15/01 TD:  08/15/01 Job: [id number]

## 2011-01-20 NOTE — Discharge Summary (Signed)
[place]  Patient:    [name], [name] Visit Number: [id number] MRN: [id number]          Service Type: MED Location: [phone] Attending Physician:  Pola Corn Dictated by:   Anselm Jungling, N.P. Admit Date:  05/01/2001 Discharge Date: 05/08/2001                             Discharge Summary  [name] is a 50 year old black female who was admitted to [facility] on May 01, 2001 through the ED with complaints of left arm pain and left leg pain.  Patient presented to the ED with complaints of having pain that lasted approximately four days prior to admission.  Patient stated the pain at that time was intermittent, lasting one to two minutes, then would go away for about 10 minutes.  Patient denied any chest pain, shortness of breath, headache, focal weakness.  Patient admitted to having some diaphoresis.  Patients blood pressure was found to be 230/142 at that time. Patient was given labetalol 10 mg IV over five minutes, Lasix 40 mg IV, and aspirin 325 mg.  Patient was then admitted to hospital with the diagnosis of hypertensive emergency, hyperglycemia, renal insufficiency.  Patient also stated that she had not taken hypertensive medication for the last month due to patient being out of work.  On May 03, 2001 patient received right renal arteriogram which was negative.  LABORATORIES:  May 07, 2001:  Glucose 215, BUN 28, creatinine 1.8. Hemoglobin 9.9, hematocrit 28.5.  CONDITION ON DISCHARGE:  Stable.  DISCHARGE MEDICATIONS: 1. Catapres 0.2 mg one p.o. q.a.m. and q.p.m. 2. Norvasc 10 mg one tablet q.d. 3. Lasix 40 mg one tablet q.a.m. 4. Lopressor 50 mg one tablet b.i.d. 5. Baby aspirin 325 mg one tablet q.a.m. 6. Protonix 40 mg one pill q.a.m. with meals. 7. Avandia 4 mg one pill p.o. b.i.d. with meals. 8. Lantus insulin 20 units q.h.s., Regular insulin sliding scale, Humulin R    blood sugar 201-250 2  units, 251-300 4 units, 301-350 6 units, 351-400 8    units, greater than 400 patient is to call M.D.  DIET:  1800 ADA, 2 g sodium.  DISCHARGE INSTRUCTIONS:  Patient is to call [phone] to get an application for free Avandia.  Patient is to be instructed on how to give insulin.  Patient is to follow-up with Dr. Donia Guiles in two weeks. Patient to call to make an appointment.  DISCHARGE DIAGNOSES: 1. Hypertension. 2. Diabetes. 3. Renal insufficiency. Dictated by:   Anselm Jungling, N.P. Attending Physician:  Pola Corn DD:  05/08/01 TD:  05/08/01 Job: [id number]

## 2011-01-20 NOTE — Cardiovascular Report (Signed)
[place]. [facility]  Patient:    [name], [name] Visit Number: [id number] MRN: [id number]          Service Type: MED Location: [phone] Attending Physician:  Ricki Rodriguez Dictated by:   Ricki Rodriguez, M.D. Proc. Date: 06/29/01 Admit Date:  06/27/2001 Discharge Date: 06/30/2001   CC:         Osvaldo Shipper. Spruill, M.D.   Cardiac Catheterization  REFERRING PHYSICIAN:  Osvaldo Shipper. Spruill, M.D.  PROCEDURE DONE BY:  Ricki Rodriguez, M.D.  HOSPITAL LOCATION:  [phone], bed #1.  PROCEDURES:  Left heart catheterization, selective coronary angiography, left ventricular function study.  INDICATIONS:  This 50 year old black female with recurrent chest pain severe enough to bring tears had inferior wall hypokinesia and inferior wall defect on a nuclear stress test in the absence of a known history of myocardial infarction.  The patient has a longstanding history of hypertension and diabetes and her ejection fraction was 45% on the nuclear stress test.  APPROACH:  Right femoral artery using 6-French diagnostic catheters.  COMPLICATIONS:  None.  HEMODYNAMIC DATA:  The left ventricular pressure was 192/15 and the aortic pressure was 192/91.  CORONARY ANATOMY: 1. The left main coronary artery was unremarkable. 2. Left anterior descending coronary artery:  The left anterior descending    coronary artery was unremarkable and is wrapped around the apex of the    heart.  The diagonal #1 and #2 vessels were normal size.  Diagonal #3 and    #4 were very small vessels. 3. Left circumflex coronary artery:  The left circumflex coronary artery was    dominant and unremarkable. 4. Right coronary artery:  The right coronary artery was unremarkable.  There    was apparently a middle part of the posterior wall without major arterial    blood supply.  This might explain the decreased perfusion of the inferior    wall.  LEFT VENTRICULOGRAM:  The left ventriculogram  showed inferior wall and anterior wall hypokinesia with an ejection fraction of 45%.  The left ventricle was somewhat dilated.  IMPRESSION: 1. Normal coronaries. 2. Hypertensive heart disease. 3. Dilated cardiomyopathy with a mild to moderate systolic dysfunction.  RECOMMENDATIONS:  This patient will be treated medically with beta blocker, calcium channel blocker, ACE inhibitor therapy. Dictated by:   Ricki Rodriguez, M.D. Attending Physician:  Ricki Rodriguez DD:  06/29/01 TD:  07/01/01 Job: [id number]

## 2011-01-20 NOTE — Discharge Summary (Signed)
NAME:  [name], [name]                        ACCOUNT NO.:  [id number]   MEDICAL RECORD NO.:  [id number]                   PATIENT TYPE:  INP   LOCATION:  3709                                 FACILITY:  [facility]   PHYSICIAN:  Mohan N. Sharyn Lull, M.D.              DATE OF BIRTH:  April 10, 1961   DATE OF ADMISSION:  07/19/2003  DATE OF DISCHARGE:  07/23/2003                                 DISCHARGE SUMMARY   ADMISSION DIAGNOSES:  1. Congestive heart failure.  2. Hypertensive cardiovascular disease.   DISCHARGE DIAGNOSES:  1. Uncompensated congestive heart failure.  2. Hypertensive heart disease with diastolic dysfunction.  3. Aerobic gram negative Klebsiella pneumoniae urinary tract infection.  4. Acute on chronic renal insufficiency.  5. Chronic anemia.  6. Hypercholesterolemia.  7. Insulin requiring diabetes mellitus.   DISCHARGE MEDICATIONS:  1. Coreg 25 mg one tablet every 12 hours.  2. Hydralazine 50 mg one tablet every eight hours.  3. Imdur 120 mg one tablet daily in the morning.  4. Catapres 0.2 mg one tablet twice daily.  5. Norvasc 10 mg one tablet daily.  6. Lasix 40 mg one tablet daily.  7. Lipitor 20 mg one tablet daily.  8. Allopurinol 100 mg one tablet daily.  9. Levaquin 250 mg one tablet daily for seven days.  10.      Lantus insulin 20 units subcutaneous at night as before.   DIET:  Low salt, low cholesterol, 1800 calorie, ADA diet.   ACTIVITY:  As tolerated.  The patient has been advised to monitor weight  daily.  If she gains 3 to 4 pounds in one to two days or 2 pounds overnight  she should call my office, or if she develops swelling in the legs or  increasing shortness of breath should call my office immediately.   FOLLOWUP:  Follow up with me in one week, and Dr. Bascom Levels in two weeks.  CBC  and BMET in one week.   CONDITION ON DISCHARGE:  Stable.   BRIEF HISTORY AND HOSPITAL COURSE:  [name] is a 50 year old black  female with a past medical history  significant for hypertensive  cardiovascular disease, history of congestive heart failure, was admitted by  Dr. Shana Chute on July 19, 2003, because of progressive increasing  shortness of breath.  The patient had prior left cardiac catheterization  approximately two years ago which showed normal coronaries with an EF of 40  to 45%.  The patient has a history of noncompliance with medications.   MEDICATIONS AT HOME:  1. Lotrel 5/10 mg p.o. b.i.d.  2. Benicar 40 mg p.o. daily.  3. Catapres patch 0.2 mg every 24 hours.  4. Toprol XL 50 mg p.o. daily.  5. Imdur 120 mg p.o. daily.   PAST MEDICAL HISTORY:  1. History of hypertension.  2. Diabetes mellitus.  3. History of congestive heart failure.   FAMILY HISTORY:  Positive for hypertension.  PHYSICAL EXAMINATION:  VITAL SIGNS:  Her blood pressure was 191/110.  NECK:  There was no JVD.  LUNGS:  There was decreased breath sounds at the bases.  HEART:  S1 and S2 were normal.  There was S4, gallop.  ABDOMEN:  Soft.  EXTREMITIES:  There was 1+ pitting edema.   LABORATORY DATA:  Chest x-ray showed congestive heart failure with pulmonary  edema.  EKG showed normal sinus rhythm with LVH with __________ pattern  versus ischemia.  Her other labs:  Hemoglobin A1C was 5.4.  CK was normal at  128, MB 2.2.  His BNP was 1140 which was elevated.  Hemoglobin was 8.7,  hematocrit 26.4, white count of 8.  Sodium was 139, potassium 4.8, chloride  110, glucose 103, BUN 53, creatinine 5.  Urine culture grew aerobic gram  negative rods which were identified as Klebsiella pneumoniae.  Her lipid  profile:  Cholesterol was 233, triglycerides 170, HDL 32, LDL 167.  Renal  ultrasound shows normal sized kidneys with no history of hydronephrosis.   BRIEF HOSPITAL COURSE:  The patient was admitted to telemetry unit and was  started on IV Lasix with good diuresis.  ACE inhibitors and ARB's were  discontinued in view of her progressive worsening renal function.   The  patient was started on hydralazine and long-acting nitrates, and was  continued on Catapres and Norvasc, and Coreg was added.  The patient was  also started on IV Rocephin which was switched to Levaquin.  The patient has  been  ambulating in the hallway without any problems.  Her breathing is markedly  improved.  Her lung sounds are clear.  The patient will be discharged home  on the above medications and will be followed up in my office in one week  and Dr. Janey Greaser office in two weeks.                                                Eduardo Osier. Sharyn Lull, M.D.    [facility]/MEDQ  D:  07/23/2003  T:  07/24/2003  Job:  [id number]   cc:   Jarome Matin, M.D.  [address] [place]  [place] [address]  Fax: [phone]   Osvaldo Shipper. Spruill, M.D.  [address] [address]  [place]  [place] [address]  Fax: [phone]

## 2011-01-23 ENCOUNTER — Ambulatory Visit: Payer: Medicare Other | Admitting: Physical Therapy

## 2011-01-25 ENCOUNTER — Ambulatory Visit: Payer: Medicare Other | Admitting: Physical Therapy

## 2011-01-27 ENCOUNTER — Ambulatory Visit: Payer: Medicare Other | Admitting: Physical Therapy

## 2011-01-31 ENCOUNTER — Encounter: Payer: Medicare Other | Admitting: Physical Therapy

## 2011-02-01 ENCOUNTER — Ambulatory Visit: Payer: Medicare Other | Admitting: Physical Therapy

## 2011-02-03 ENCOUNTER — Ambulatory Visit: Payer: Medicare Other | Attending: Physical Medicine & Rehabilitation | Admitting: Physical Therapy

## 2011-02-03 DIAGNOSIS — S78119A Complete traumatic amputation at level between unspecified hip and knee, initial encounter: Secondary | ICD-10-CM | POA: Insufficient documentation

## 2011-02-03 DIAGNOSIS — R5381 Other malaise: Secondary | ICD-10-CM | POA: Insufficient documentation

## 2011-02-03 DIAGNOSIS — IMO0001 Reserved for inherently not codable concepts without codable children: Secondary | ICD-10-CM | POA: Insufficient documentation

## 2011-02-03 DIAGNOSIS — R269 Unspecified abnormalities of gait and mobility: Secondary | ICD-10-CM | POA: Insufficient documentation

## 2011-02-03 DIAGNOSIS — M6281 Muscle weakness (generalized): Secondary | ICD-10-CM | POA: Insufficient documentation

## 2011-02-06 ENCOUNTER — Ambulatory Visit: Payer: Medicare Other | Admitting: Physical Therapy

## 2011-02-08 ENCOUNTER — Ambulatory Visit: Payer: Medicare Other | Admitting: Physical Therapy

## 2011-02-10 ENCOUNTER — Ambulatory Visit: Payer: Medicare Other | Admitting: Physical Therapy

## 2011-02-13 ENCOUNTER — Ambulatory Visit: Payer: Medicare Other | Admitting: Physical Therapy

## 2011-02-14 ENCOUNTER — Ambulatory Visit: Payer: Medicare Other | Admitting: Physical Therapy

## 2011-02-17 ENCOUNTER — Ambulatory Visit: Payer: Medicare Other | Admitting: Physical Therapy

## 2011-02-22 ENCOUNTER — Ambulatory Visit: Payer: Medicare Other | Admitting: Physical Therapy

## 2011-02-24 ENCOUNTER — Ambulatory Visit: Payer: Medicare Other | Admitting: Physical Therapy

## 2011-02-27 ENCOUNTER — Ambulatory Visit: Payer: Medicare Other | Admitting: Physical Therapy

## 2011-03-01 ENCOUNTER — Ambulatory Visit: Payer: Medicare Other | Admitting: Physical Therapy

## 2011-03-06 ENCOUNTER — Ambulatory Visit: Payer: Medicare Other | Attending: Physical Medicine & Rehabilitation | Admitting: Physical Therapy

## 2011-03-10 ENCOUNTER — Ambulatory Visit: Payer: Medicare Other | Admitting: Physical Therapy

## 2011-03-13 ENCOUNTER — Ambulatory Visit: Payer: Medicare Other | Admitting: Physical Therapy

## 2011-03-15 ENCOUNTER — Ambulatory Visit: Payer: Medicare Other | Admitting: Physical Therapy

## 2011-03-20 ENCOUNTER — Ambulatory Visit: Payer: Medicare Other | Admitting: Physical Therapy

## 2011-03-21 ENCOUNTER — Inpatient Hospital Stay (HOSPITAL_COMMUNITY)
Admission: EM | Admit: 2011-03-21 | Discharge: 2011-03-23 | DRG: 291 | Disposition: A | Discharge status: Home / Self Care | Payer: Medicare Other | Attending: Internal Medicine | Admitting: Internal Medicine

## 2011-03-21 ENCOUNTER — Inpatient Hospital Stay (HOSPITAL_COMMUNITY): Payer: Medicare Other

## 2011-03-21 ENCOUNTER — Emergency Department (HOSPITAL_COMMUNITY): Payer: Medicare Other

## 2011-03-21 DIAGNOSIS — S88119A Complete traumatic amputation at level between knee and ankle, unspecified lower leg, initial encounter: Secondary | ICD-10-CM

## 2011-03-21 DIAGNOSIS — I509 Heart failure, unspecified: Principal | ICD-10-CM | POA: Diagnosis present

## 2011-03-21 DIAGNOSIS — Z841 Family history of disorders of kidney and ureter: Secondary | ICD-10-CM

## 2011-03-21 DIAGNOSIS — Z794 Long term (current) use of insulin: Secondary | ICD-10-CM

## 2011-03-21 DIAGNOSIS — D631 Anemia in chronic kidney disease: Secondary | ICD-10-CM | POA: Diagnosis present

## 2011-03-21 DIAGNOSIS — F411 Generalized anxiety disorder: Secondary | ICD-10-CM | POA: Diagnosis present

## 2011-03-21 DIAGNOSIS — N039 Chronic nephritic syndrome with unspecified morphologic changes: Secondary | ICD-10-CM | POA: Diagnosis present

## 2011-03-21 DIAGNOSIS — I70209 Unspecified atherosclerosis of native arteries of extremities, unspecified extremity: Secondary | ICD-10-CM | POA: Diagnosis present

## 2011-03-21 DIAGNOSIS — S78119A Complete traumatic amputation at level between unspecified hip and knee, initial encounter: Secondary | ICD-10-CM

## 2011-03-21 DIAGNOSIS — IMO0001 Reserved for inherently not codable concepts without codable children: Secondary | ICD-10-CM | POA: Diagnosis present

## 2011-03-21 DIAGNOSIS — I120 Hypertensive chronic kidney disease with stage 5 chronic kidney disease or end stage renal disease: Secondary | ICD-10-CM | POA: Diagnosis present

## 2011-03-21 DIAGNOSIS — I2510 Atherosclerotic heart disease of native coronary artery without angina pectoris: Secondary | ICD-10-CM | POA: Diagnosis present

## 2011-03-21 DIAGNOSIS — Z992 Dependence on renal dialysis: Secondary | ICD-10-CM

## 2011-03-21 DIAGNOSIS — Z7982 Long term (current) use of aspirin: Secondary | ICD-10-CM

## 2011-03-21 DIAGNOSIS — N2581 Secondary hyperparathyroidism of renal origin: Secondary | ICD-10-CM | POA: Diagnosis present

## 2011-03-21 DIAGNOSIS — N186 End stage renal disease: Secondary | ICD-10-CM | POA: Diagnosis present

## 2011-03-21 DIAGNOSIS — Z833 Family history of diabetes mellitus: Secondary | ICD-10-CM

## 2011-03-21 LAB — DIFFERENTIAL
Basophils Relative: 1 % (ref 0–1)
Lymphocytes Relative: 23 % (ref 12–46)
Lymphs Abs: 2.1 10*3/uL (ref 0.7–4.0)
Monocytes Absolute: 0.4 10*3/uL (ref 0.1–1.0)
Monocytes Relative: 4 % (ref 3–12)
Neutro Abs: 6.6 10*3/uL (ref 1.7–7.7)
Neutrophils Relative %: 69 % (ref 43–77)

## 2011-03-21 LAB — BASIC METABOLIC PANEL
BUN: 67 mg/dL — ABNORMAL HIGH (ref 6–23)
CO2: 27 mEq/L (ref 19–32)
Calcium: 9 mg/dL (ref 8.4–10.5)
Glucose, Bld: 366 mg/dL — ABNORMAL HIGH (ref 70–99)
Sodium: 130 mEq/L — ABNORMAL LOW (ref 135–145)

## 2011-03-21 LAB — GLUCOSE, CAPILLARY
Glucose-Capillary: 209 mg/dL — ABNORMAL HIGH (ref 70–99)
Glucose-Capillary: 242 mg/dL — ABNORMAL HIGH (ref 70–99)

## 2011-03-21 LAB — CBC
HCT: 31.7 % — ABNORMAL LOW (ref 36.0–46.0)
Hemoglobin: 10.4 g/dL — ABNORMAL LOW (ref 12.0–15.0)
MCH: 26.6 pg (ref 26.0–34.0)
MCV: 81.1 fL (ref 78.0–100.0)
RBC: 3.91 MIL/uL (ref 3.87–5.11)

## 2011-03-21 LAB — HEMOGLOBIN A1C
Hgb A1c MFr Bld: 11.5 % — ABNORMAL HIGH (ref <5.7)
Mean Plasma Glucose: 283 mg/dL — ABNORMAL HIGH (ref <117)

## 2011-03-21 LAB — HEPATIC FUNCTION PANEL
ALT: 11 U/L (ref 0–35)
AST: 13 U/L (ref 0–37)
Alkaline Phosphatase: 109 U/L (ref 39–117)
Bilirubin, Direct: <0.1 mg/dL (ref 0.0–0.3)

## 2011-03-22 ENCOUNTER — Inpatient Hospital Stay (HOSPITAL_COMMUNITY): Payer: Medicare Other

## 2011-03-22 ENCOUNTER — Encounter: Payer: Medicare Other | Admitting: Physical Therapy

## 2011-03-22 LAB — RENAL FUNCTION PANEL
Calcium: 8.7 mg/dL (ref 8.4–10.5)
Creatinine, Ser: 8.12 mg/dL — ABNORMAL HIGH (ref 0.50–1.10)
Glucose, Bld: 147 mg/dL — ABNORMAL HIGH (ref 70–99)
Phosphorus: 5.7 mg/dL — ABNORMAL HIGH (ref 2.3–4.6)
Sodium: 135 mEq/L (ref 135–145)

## 2011-03-22 LAB — CBC
Hemoglobin: 9 g/dL — ABNORMAL LOW (ref 12.0–15.0)
MCH: 26.5 pg (ref 26.0–34.0)
MCHC: 32.1 g/dL (ref 30.0–36.0)

## 2011-03-22 LAB — GLUCOSE, CAPILLARY: Glucose-Capillary: 120 mg/dL — ABNORMAL HIGH (ref 70–99)

## 2011-03-22 LAB — TSH: TSH: 1.744 u[IU]/mL (ref 0.350–4.500)

## 2011-03-23 ENCOUNTER — Inpatient Hospital Stay (HOSPITAL_COMMUNITY): Payer: Medicare Other

## 2011-03-23 LAB — CBC
HCT: 27.9 % — ABNORMAL LOW (ref 36.0–46.0)
Hemoglobin: 9 g/dL — ABNORMAL LOW (ref 12.0–15.0)
MCH: 26.7 pg (ref 26.0–34.0)
MCHC: 32.3 g/dL (ref 30.0–36.0)
RDW: 15 % (ref 11.5–15.5)

## 2011-03-23 LAB — RENAL FUNCTION PANEL
BUN: 23 mg/dL (ref 6–23)
Calcium: 9 mg/dL (ref 8.4–10.5)
Creatinine, Ser: 5.57 mg/dL — ABNORMAL HIGH (ref 0.50–1.10)
Glucose, Bld: 179 mg/dL — ABNORMAL HIGH (ref 70–99)
Phosphorus: 5.1 mg/dL — ABNORMAL HIGH (ref 2.3–4.6)

## 2011-03-23 LAB — GLUCOSE, CAPILLARY
Glucose-Capillary: 282 mg/dL — ABNORMAL HIGH (ref 70–99)
Glucose-Capillary: 204 mg/dL — ABNORMAL HIGH (ref 70–99)
Glucose-Capillary: 169 mg/dL — ABNORMAL HIGH (ref 70–99)

## 2011-03-26 NOTE — Discharge Summary (Signed)
[name], [name]              ACCOUNT NO.:  [id number]  MEDICAL RECORD NO.:  [id number]  LOCATION:  6705                         FACILITY:  [facility]  PHYSICIAN:  Isidor Holts, M.D.  DATE OF BIRTH:  02-May-1961  DATE OF ADMISSION:  03/21/2011 DATE OF DISCHARGE:  03/23/2011                              DISCHARGE SUMMARY   PRIMARY MD:  Osvaldo Shipper. Spruill, MD  PRIMARY NEPHROLOGIST:  [facility].  DISCHARGE DIAGNOSES: 1. Fluid overload/congestive heart failure. 2. End-stage renal disease, on hemodialysis Tuesdays, Thursdays and     Saturdays. 3. Insulin-requiring type 2 diabetes mellitus. 4. Hypertension. 5. Anemia of chronic disease. 6. Anxiety disorder. 7. Peripheral vascular disease, status post bilateral lower extremity     amputations. 8. History of coronary artery disease.  DISCHARGE MEDICATIONS: 1. Amlodipine 10 mg p.o. daily nightly. 2. Aspirin enteric coated over the counter 81 mg p.o. daily. 3. Calcium acetate 667 mg/5 mL 2001 mg p.o. t.i.d. with meals. 4. NovoLog insulin subcutaneously per sliding scale t.i.d. as follows:     For CBG 70-120 no insulin, CBG 121-150 one unit, CBG 151-200 two     units, CBG 201-250 three units, CBG 251-300 five units, CBG 301-350     seven units, CBG 351-400 nine units. 5. Lisinopril 20 mg p.o. nightly. 6. Lantus insulin 15 units subcutaneously nightly (was on 8 units     subcutaneously nightly). 7. Metoprolol tartrate 75 mg p.o. b.i.d. (was on 50 mg p.o. b.i.d.). 8. Hemodialysis Tuesdays, Thursdays and Saturdays. 9. Renal vitamin 1 tablet p.o. daily. 10.Sensipar 60 mg p.o. daily.  PROCEDURES: 1. Chest x-ray on March 21, 2011, this showed widespread increased     interstitial opacity plus superimposed right perihilar and middle     lobe airspace disease, stable cardiomegaly. 2. Repeat chest x-ray post dialysis March 21, 2011.  This showed mild     cardiomegaly and pulmonary vascular cephalization without frank  edema. 3. A 2-D echocardiogram on March 22, 2011, report was still pending at     the time of this dictation.  CONSULTATIONS:  Dr. Annie Sable, nephrologist.  ADMISSION HISTORY:  As in H and P notes of March 21, 2011, dictated by Dr. Lonia Blood. However, in brief, this is a 50 year old female, with known history of hypertension, peripheral vascular disease status post bilateral lower extremity amputations, hypertension, end-stage renal disease on hemodialysis Tuesdays, Thursdays, and Saturdays, history of congestive heart failure, insulin-requiring type 2 diabetes mellitus, anemia of chronic disease, anxiety disorder, history of coronary artery disease, presenting with progressive shortness of breath over a 24-hour period, associated with chest discomfort.  The patient was admitted for further evaluation, investigation and management.  CLINICAL COURSE: 1. CHF/fluid overload.  The patient presented as described above.     Chest x-ray showed findings consistent with pulmonary edema.     Nephrology consultation was kindly provided by Dr. Annie Sable.  The patient had hemodialysis done, she felt     considerably better and repeat chest x-ray showed interval     resolution of pulmonary edema.  The patient was commenced on     regular dialysis schedule and thereafter had no clinical evidence  of fluid overload.  2. Type 2 diabetes mellitus.  This is insulin-requiring.  The patient     was managed with sliding scale insulin coverage as well as     scheduled Lantus insulin and appropriate diet.  Her Lantus has     increased from 8 units subcutaneously nightly to 15 units     subcutaneously nightly, with improved glycemic control.  3. Hypertension.  This was managed with hemodialysis as well as     increase in her metoprolol to 75 mg p.o. b.i.d. and addition of     lisinopril and Norvasc. As of March 23, 2011, she was normotensive     with BP of 129/78 mmHg.  4.  Anemia of chronic disease.  This has remained stable/reasonable.     As a matter of fact on March 23, 2011, hemoglobin was 9.0 with a     hematocrit of 27.9.  5. Anxiety disorder.  The patient's mood remained stable throughout     the course of this hospitalization.  DISPOSITION:  The patient underwent 2-D echocardiogram on March 22, 2011. Unfortunately, report was unavailable at the time of this dictation. Follow up will therefore be deferred to the patient's primary MD/cardiologist, Dr. Donia Guiles.  The patient is otherwise clinically considered stable for discharge.  She was asymptomatic and showed no evidence of fluid overload or congestive heart failure on March 23, 2011. She was therefore discharged accordingly.  ACTIVITY:  As tolerated.  DIET:  Renal 60-2-2.  FOLLOWUP INSTRUCTIONS:  The patient is to follow up with her primary MD, Dr. Donia Guiles within 1-2 weeks of discharge.  She has been instructed to call for an appointment, in addition she is to follow up with her primary nephrologist, Dr. Annie Sable per prior scheduled appointment.  SPECIAL INSTRUCTIONS:  The patient is to maintain her regular scheduled hemodialysis sessions on Tuesdays, Thursdays and Saturdays.  All these have been communicated to the patient, he verbalized understanding.     Isidor Holts, M.D.     CO/MEDQ  D:  03/23/2011  T:  03/24/2011  Job:  [id number]  cc:   Osvaldo Shipper. Spruill, M.D. Cecille Aver, M.D.  Electronically Signed by Isidor Holts M.D. on 03/26/2011 06:13:11 PM

## 2011-03-27 ENCOUNTER — Ambulatory Visit: Payer: Medicare Other | Admitting: Physical Therapy

## 2011-03-29 ENCOUNTER — Ambulatory Visit: Payer: Medicare Other | Admitting: Physical Therapy

## 2011-04-05 ENCOUNTER — Ambulatory Visit: Payer: Medicare Other | Attending: Physical Medicine & Rehabilitation | Admitting: Physical Therapy

## 2011-04-05 DIAGNOSIS — R5381 Other malaise: Secondary | ICD-10-CM | POA: Insufficient documentation

## 2011-04-05 DIAGNOSIS — S78119A Complete traumatic amputation at level between unspecified hip and knee, initial encounter: Secondary | ICD-10-CM | POA: Insufficient documentation

## 2011-04-05 DIAGNOSIS — M6281 Muscle weakness (generalized): Secondary | ICD-10-CM | POA: Insufficient documentation

## 2011-04-05 DIAGNOSIS — IMO0001 Reserved for inherently not codable concepts without codable children: Secondary | ICD-10-CM | POA: Insufficient documentation

## 2011-04-05 DIAGNOSIS — R269 Unspecified abnormalities of gait and mobility: Secondary | ICD-10-CM | POA: Insufficient documentation

## 2011-04-10 ENCOUNTER — Ambulatory Visit: Payer: Medicare Other | Admitting: Physical Therapy

## 2011-04-12 ENCOUNTER — Ambulatory Visit: Payer: Medicare Other | Admitting: Physical Therapy

## 2011-04-17 ENCOUNTER — Ambulatory Visit: Payer: Medicare Other | Admitting: Physical Therapy

## 2011-04-21 ENCOUNTER — Ambulatory Visit: Payer: Medicare Other | Admitting: Physical Therapy

## 2011-04-26 ENCOUNTER — Ambulatory Visit: Payer: Medicare Other | Admitting: Physical Therapy

## 2011-04-28 ENCOUNTER — Ambulatory Visit: Payer: Medicare Other | Admitting: Physical Therapy

## 2011-05-01 ENCOUNTER — Ambulatory Visit: Payer: Medicare Other | Admitting: Physical Therapy

## 2011-05-01 ENCOUNTER — Encounter: Payer: Medicare Other | Admitting: Physical Therapy

## 2011-05-05 ENCOUNTER — Ambulatory Visit: Payer: Medicare Other | Admitting: Physical Therapy

## 2011-05-10 ENCOUNTER — Ambulatory Visit: Payer: Medicare Other | Attending: Physical Medicine & Rehabilitation | Admitting: *Deleted

## 2011-05-10 DIAGNOSIS — R5381 Other malaise: Secondary | ICD-10-CM | POA: Insufficient documentation

## 2011-05-10 DIAGNOSIS — M6281 Muscle weakness (generalized): Secondary | ICD-10-CM | POA: Insufficient documentation

## 2011-05-10 DIAGNOSIS — R269 Unspecified abnormalities of gait and mobility: Secondary | ICD-10-CM | POA: Insufficient documentation

## 2011-05-10 DIAGNOSIS — S78119A Complete traumatic amputation at level between unspecified hip and knee, initial encounter: Secondary | ICD-10-CM | POA: Insufficient documentation

## 2011-05-10 DIAGNOSIS — IMO0001 Reserved for inherently not codable concepts without codable children: Secondary | ICD-10-CM | POA: Insufficient documentation

## 2011-05-12 ENCOUNTER — Ambulatory Visit: Payer: Medicare Other | Admitting: Physical Therapy

## 2011-05-15 ENCOUNTER — Encounter: Payer: Medicare Other | Admitting: Physical Therapy

## 2011-05-17 ENCOUNTER — Encounter: Payer: Medicare Other | Admitting: Physical Therapy

## 2011-06-05 LAB — TYPE AND SCREEN: Antibody Screen: NEGATIVE

## 2011-06-05 LAB — GLUCOSE, CAPILLARY
Glucose-Capillary: 116 — ABNORMAL HIGH
Glucose-Capillary: 175 — ABNORMAL HIGH
Glucose-Capillary: 188 — ABNORMAL HIGH
Glucose-Capillary: 204 — ABNORMAL HIGH
Glucose-Capillary: 205 — ABNORMAL HIGH
Glucose-Capillary: 213 — ABNORMAL HIGH
Glucose-Capillary: 250 — ABNORMAL HIGH
Glucose-Capillary: 287 — ABNORMAL HIGH
Glucose-Capillary: 292 — ABNORMAL HIGH
Glucose-Capillary: 392 — ABNORMAL HIGH
Glucose-Capillary: 84

## 2011-06-05 LAB — POCT I-STAT, CHEM 8
Chloride: 104
Glucose, Bld: 347 — ABNORMAL HIGH
HCT: 45
Potassium: 4.2

## 2011-06-05 LAB — DIFFERENTIAL
Basophils Relative: 1
Lymphocytes Relative: 31
Monocytes Relative: 7
Neutro Abs: 9.7 — ABNORMAL HIGH
Neutrophils Relative %: 59

## 2011-06-05 LAB — RENAL FUNCTION PANEL
Albumin: 2.8 — ABNORMAL LOW
Albumin: 3 — ABNORMAL LOW
CO2: 19
CO2: 24
CO2: 27
Calcium: 9.2
Chloride: 90 — ABNORMAL LOW
Chloride: 91 — ABNORMAL LOW
Creatinine, Ser: 10.45 — ABNORMAL HIGH
Creatinine, Ser: 11.96 — ABNORMAL HIGH
Creatinine, Ser: 13.46 — ABNORMAL HIGH
GFR calc Af Amer: 4 — ABNORMAL LOW
GFR calc Af Amer: 4 — ABNORMAL LOW
GFR calc Af Amer: 5 — ABNORMAL LOW
GFR calc non Af Amer: 3 — ABNORMAL LOW
GFR calc non Af Amer: 4 — ABNORMAL LOW
Glucose, Bld: 407 — ABNORMAL HIGH
Potassium: 4.5
Sodium: 131 — ABNORMAL LOW

## 2011-06-05 LAB — CBC
HCT: 31.5 — ABNORMAL LOW
Hemoglobin: 10.3 — ABNORMAL LOW
MCHC: 31.4
MCHC: 32.9
MCV: 86.7
MCV: 87.3
Platelets: 112 — ABNORMAL LOW
Platelets: 148 — ABNORMAL LOW
RBC: 4.82
RBC: 5.02
RDW: 16.6 — ABNORMAL HIGH
WBC: 16.4 — ABNORMAL HIGH
WBC: 4

## 2011-06-05 LAB — RAPID URINE DRUG SCREEN, HOSP PERFORMED
Cocaine: NOT DETECTED
Opiates: NOT DETECTED

## 2011-06-05 LAB — POCT I-STAT 3, ART BLOOD GAS (G3+)
Acid-Base Excess: 8 — ABNORMAL HIGH
Bicarbonate: 31.2 — ABNORMAL HIGH
Patient temperature: 96.7
TCO2: 32
pO2, Arterial: 266 — ABNORMAL HIGH

## 2011-06-05 LAB — POCT CARDIAC MARKERS
Myoglobin, poc: 441
Troponin i, poc: <0.05

## 2011-06-05 LAB — ABO/RH: ABO/RH(D): A POS

## 2011-06-05 LAB — CK TOTAL AND CKMB (NOT AT ARMC)
Relative Index: INVALID
Total CK: 204 — ABNORMAL HIGH

## 2011-06-17 NOTE — H&P (Signed)
[name], [name]              ACCOUNT NO.:  [id number]  MEDICAL RECORD NO.:  [id number]  LOCATION:  [facility]                         FACILITY:  [facility]  PHYSICIAN:  Lonia Blood, M.D.      DATE OF BIRTH:  07-02-1961  DATE OF ADMISSION:  03/21/2011 DATE OF DISCHARGE:                             HISTORY & PHYSICAL   PRIMARY CARE PHYSICIAN:  She sees Nephrology mainly.  CARDIOLOGIST:  Osvaldo Shipper. Spruill, MD  PRESENTING COMPLAINT:  Shortness of breath.  HISTORY OF PRESENT ILLNESS:  The patient is a 50 year old female with end-stage renal disease on hemodialysis Tuesdays, Thursdays, and Saturdays who came to the ED with progressive shortness of breath over the last 24 hours.  This shortness of breath is associated with chest discomfort.  Denied any fever.  Denied any nausea, vomiting, or diarrhea.  She has had also some shortness of breath associated with not having her hemodialysis.  She sees Dr. Annie Sable.  No fever orchills.  No cough.  PAST MEDICAL HISTORY:  Significant for: 1. End-stage renal disease on hemodialysis Tuesdays, Thursdays, and     Saturdays. 2. Hypertension. 3. History of congestive heart failure. 4. Diabetes. 5. Coronary artery disease. 6. 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 7. Anemia of Crohn disease. 8. Anxiety disorder. 9. Coronary artery disease status post hysterectomy, status post prior     cardiac catheterization.  ALLERGIES:  No known drug allergies.  MEDICATIONS: 1. Sensipar 60 mg daily. 2. Renal vitamin 1 tablet daily. 3. Metoprolol 50 mg twice daily. 4. Lantus insulin 8 units subcutaneously at night.  SOCIAL HISTORY:  The patient lives in [place].  She is married, was living with her husband.  No tobacco.  No alcohol.  FAMILY HISTORY:  Significant for diabetes and cancer as well as end- stage renal disease also.  REVIEW OF SYSTEMS:  Reviewed are  negative except per HPI.  PHYSICAL EXAMINATION:  VITAL SIGNS:  Temperature is 97.7, blood pressure 186/90, pulse 84, respiratory rate 18, sat is 93% on room air. GENERAL:  The patient is awake, alert, oriented.  She is in no acute distress. HEENT:  PERRLA.  EOMI.  No significant pallor.  No jaundice.  No rhinorrhea. NECK:  Supple with visible JVD to the angle of the jaw. RESPIRATORY:  She has decreased air entry bilaterally with significant widespread crackles but no wheezing. CARDIOVASCULAR:  She has S1, S2.  No audible murmur. ABDOMEN:  Soft, full, nontender with positive bowel sounds. EXTREMITIES:  She is status post bilateral amputations with stump, seems to be healed.  LABORATORY DATA:  Her white count is 9.5, hemoglobin 10.4 with platelet count of 324.  Sodium is 130, potassium 4.3, chloride 85, CO2 of 27, glucose 366, BUN 67, creatinine 11.47 with calcium 9.0.  ProBNP is 17,730.  Chest x-ray shows widespread increased interstitial opacity plus superimposed right perihilar and middle lobe airspace disease. Differential shows asymmetric pulmonary edema versus pneumonia.  There is also stable cardiomegaly.  ASSESSMENT:  This is a 50 year old dialysis patient presenting with shortness of breath and seems to have fluid overload with congestive heart failure.  Her  chest x-ray may suggest possible pneumonia but she has no fever, no white count to suggest any of that.  PLAN: 1. Shortness of breath, more than likely related to fluid overload and     the patient will need intervention this morning.  We will admit the     patient to tele bed and get hemodialysis this morning for urgent     relief of her symptoms.  She is due for it today anyway.  Continue     care with Nephrology.  If needed, we may consider cardiac consult     as well. 2. Diabetes.  Continue sliding scale insulin at this point. 3. Hypertension.  Again, she will get dialyzed this morning.     Hopefully, this will  fix her blood pressure.  In the meantime,     continue with her home medication of metoprolol.  She has been on     amlodipine before and we may resume that. 4. Anemia of chronic disease secondary to her hemodialysis.  This will     also be addressed by Nephrology. 5. Anxiety disorder.  Continue home medication.  She is currently not     on any medications though but p.r.n. Ativan will be added. 6. History of coronary artery disease.  We will cycle her enzymes.     Lonia Blood, M.D.     Verlin Grills  D:  03/21/2011  T:  03/21/2011  Job:  [id number]  Electronically Signed by Lonia Blood M.D. on 06/17/2011 02:45:11 PM

## 2011-06-19 ENCOUNTER — Emergency Department (HOSPITAL_COMMUNITY): Payer: Medicare Other

## 2011-06-19 ENCOUNTER — Inpatient Hospital Stay (HOSPITAL_COMMUNITY)
Admission: EM | Admit: 2011-06-19 | Discharge: 2011-06-24 | DRG: 291 | Disposition: A | Discharge status: Home / Self Care | Payer: Medicare Other | Source: Ambulatory Visit | Attending: Nephrology | Admitting: Nephrology

## 2011-06-19 DIAGNOSIS — I5021 Acute systolic (congestive) heart failure: Principal | ICD-10-CM | POA: Diagnosis present

## 2011-06-19 DIAGNOSIS — I501 Left ventricular failure: Secondary | ICD-10-CM | POA: Diagnosis present

## 2011-06-19 DIAGNOSIS — Z7982 Long term (current) use of aspirin: Secondary | ICD-10-CM

## 2011-06-19 DIAGNOSIS — Z794 Long term (current) use of insulin: Secondary | ICD-10-CM

## 2011-06-19 DIAGNOSIS — N186 End stage renal disease: Secondary | ICD-10-CM | POA: Diagnosis present

## 2011-06-19 DIAGNOSIS — IMO0001 Reserved for inherently not codable concepts without codable children: Secondary | ICD-10-CM | POA: Diagnosis present

## 2011-06-19 DIAGNOSIS — D649 Anemia, unspecified: Secondary | ICD-10-CM | POA: Diagnosis present

## 2011-06-19 DIAGNOSIS — S78119A Complete traumatic amputation at level between unspecified hip and knee, initial encounter: Secondary | ICD-10-CM

## 2011-06-19 DIAGNOSIS — Z79899 Other long term (current) drug therapy: Secondary | ICD-10-CM

## 2011-06-19 DIAGNOSIS — S88119A Complete traumatic amputation at level between knee and ankle, unspecified lower leg, initial encounter: Secondary | ICD-10-CM

## 2011-06-19 DIAGNOSIS — I120 Hypertensive chronic kidney disease with stage 5 chronic kidney disease or end stage renal disease: Secondary | ICD-10-CM | POA: Diagnosis present

## 2011-06-19 DIAGNOSIS — J40 Bronchitis, not specified as acute or chronic: Secondary | ICD-10-CM | POA: Diagnosis present

## 2011-06-19 DIAGNOSIS — I2510 Atherosclerotic heart disease of native coronary artery without angina pectoris: Secondary | ICD-10-CM | POA: Diagnosis present

## 2011-06-19 DIAGNOSIS — N2581 Secondary hyperparathyroidism of renal origin: Secondary | ICD-10-CM | POA: Diagnosis present

## 2011-06-19 DIAGNOSIS — Z992 Dependence on renal dialysis: Secondary | ICD-10-CM

## 2011-06-19 LAB — POCT I-STAT, CHEM 8
Chloride: 100 mEq/L (ref 96–112)
Creatinine, Ser: 12 mg/dL — ABNORMAL HIGH (ref 0.50–1.10)
Glucose, Bld: 341 mg/dL — ABNORMAL HIGH (ref 70–99)
Potassium: 3.8 mEq/L (ref 3.5–5.1)

## 2011-06-20 ENCOUNTER — Emergency Department (HOSPITAL_COMMUNITY): Payer: Medicare Other

## 2011-06-20 LAB — DIFFERENTIAL
Lymphocytes Relative: 28 % (ref 12–46)
Lymphs Abs: 2.5 10*3/uL (ref 0.7–4.0)
Neutrophils Relative %: 61 % (ref 43–77)

## 2011-06-20 LAB — POCT I-STAT 3, ART BLOOD GAS (G3+)
Bicarbonate: 25 mEq/L — ABNORMAL HIGH (ref 20.0–24.0)
O2 Saturation: 97 %
pO2, Arterial: 99 mmHg (ref 80.0–100.0)

## 2011-06-20 LAB — GLUCOSE, CAPILLARY
Glucose-Capillary: 188 mg/dL — ABNORMAL HIGH (ref 70–99)
Glucose-Capillary: 141 mg/dL — ABNORMAL HIGH (ref 70–99)

## 2011-06-20 LAB — CBC
HCT: 40.7 % (ref 36.0–46.0)
MCV: 84.6 fL (ref 78.0–100.0)
Platelets: 223 10*3/uL (ref 150–400)
RBC: 4.81 MIL/uL (ref 3.87–5.11)
WBC: 8.9 10*3/uL (ref 4.0–10.5)

## 2011-06-20 LAB — CARDIAC PANEL(CRET KIN+CKTOT+MB+TROPI)
CK, MB: 9.3 ng/mL (ref 0.3–4.0)
Relative Index: 3.4 — ABNORMAL HIGH (ref 0.0–2.5)
Total CK: 134 U/L (ref 7–177)

## 2011-06-20 LAB — POCT I-STAT TROPONIN I

## 2011-06-20 LAB — PROCALCITONIN: Procalcitonin: 0.63 ng/mL

## 2011-06-21 ENCOUNTER — Inpatient Hospital Stay (HOSPITAL_COMMUNITY): Payer: Medicare Other

## 2011-06-21 LAB — RENAL FUNCTION PANEL
Albumin: 3.7 g/dL (ref 3.5–5.2)
Chloride: 98 mEq/L (ref 96–112)
GFR calc non Af Amer: 15 mL/min — ABNORMAL LOW (ref >90)
Phosphorus: 2.5 mg/dL (ref 2.3–4.6)
Potassium: 2.7 mEq/L — CL (ref 3.5–5.1)
Sodium: 138 mEq/L (ref 135–145)

## 2011-06-21 LAB — GLUCOSE, CAPILLARY
Glucose-Capillary: 71 mg/dL (ref 70–99)
Glucose-Capillary: 206 mg/dL — ABNORMAL HIGH (ref 70–99)

## 2011-06-21 LAB — CBC
Platelets: 196 10*3/uL (ref 150–400)
RDW: 15.6 % — ABNORMAL HIGH (ref 11.5–15.5)
WBC: 6.4 10*3/uL (ref 4.0–10.5)

## 2011-06-22 ENCOUNTER — Inpatient Hospital Stay (HOSPITAL_COMMUNITY): Payer: Medicare Other

## 2011-06-22 LAB — BASIC METABOLIC PANEL
CO2: 24 mEq/L (ref 19–32)
Calcium: 9.8 mg/dL (ref 8.4–10.5)
Chloride: 92 mEq/L — ABNORMAL LOW (ref 96–112)
Creatinine, Ser: 7.42 mg/dL — ABNORMAL HIGH (ref 0.50–1.10)
Glucose, Bld: 152 mg/dL — ABNORMAL HIGH (ref 70–99)

## 2011-06-22 LAB — CARDIAC PANEL(CRET KIN+CKTOT+MB+TROPI)
CK, MB: 2.3 ng/mL (ref 0.3–4.0)
Relative Index: INVALID (ref 0.0–2.5)

## 2011-06-22 LAB — GLUCOSE, CAPILLARY
Glucose-Capillary: 242 mg/dL — ABNORMAL HIGH (ref 70–99)
Glucose-Capillary: 117 mg/dL — ABNORMAL HIGH (ref 70–99)

## 2011-06-23 ENCOUNTER — Inpatient Hospital Stay (HOSPITAL_COMMUNITY): Payer: Medicare Other

## 2011-06-23 MED ORDER — TECHNETIUM TC 99M TETROFOSMIN IV KIT
10.0000 | PACK | Freq: Once | INTRAVENOUS | Status: AC | PRN
  Administered 2011-06-23: 10 via INTRAVENOUS

## 2011-06-23 MED ORDER — TECHNETIUM TC 99M TETROFOSMIN IV KIT
30.0000 | PACK | Freq: Once | INTRAVENOUS | Status: AC | PRN
  Administered 2011-06-23: 30 via INTRAVENOUS

## 2011-06-24 ENCOUNTER — Inpatient Hospital Stay (HOSPITAL_COMMUNITY): Payer: Medicare Other

## 2011-06-26 LAB — CULTURE, BLOOD (ROUTINE X 2)
Culture  Setup Time: 201210160423
Culture  Setup Time: 201210160423
Culture: NO GROWTH

## 2011-06-28 NOTE — H&P (Signed)
  NAMEJOSHLYN, BEADLE              ACCOUNT NO.:  [id number]  MEDICAL RECORD NO.:  [id number]  LOCATION:  4735                         FACILITY:  [facility]  PHYSICIAN:  Mindi Slicker. Lowell Guitar, M.D.  DATE OF BIRTH:  1961-02-19  DATE OF ADMISSION:  06/19/2011 DATE OF DISCHARGE:                             HISTORY & PHYSICAL   HISTORY OF PRESENT ILLNESS:  The patient is a 50 year old female with end-stage renal disease who receives hemodialysis at the [facility] on Tuesdays, Thursdays, and Saturdays, presents today with complaint of several week history of cough and the acute onset tonight of dyspnea and substernal chest pressure without radiation of the pain. The patient had marked dyspnea that came on acutely.  Family does note that she looks puffy.  She was due dialysis tomorrow.  She presented to the emergency room with following chest x-ray to have pulmonary edema, oxygen saturation of 86% on room air with a blood pressure of 240/128. She is admitted now for urgent treatment.  PAST MEDICAL HISTORY: 1. Diabetes. 2. Hypertension. 3. Peripheral vascular disease, status post above-the-knee amputation     on the right and below-knee amputation on the left. 4. Coronary artery disease. 5. Anemia. 6. ESRD.  SOCIAL HISTORY:  She is married.  Does not smoke cigarettes or consume alcoholic beverages.  FAMILY HISTORY:  Reportedly positive for diabetes, cancer, and end-stage renal disease.  CURRENT MEDICATIONS:  Were taken from the last discharge summary and will be implemented pending an official list which she is not able to give [place] at the current time.  PHYSICAL EXAMINATION:  VITAL SIGNS:  Blood pressure is 217/119, heart rate 115. GENERAL:  Obese, African-American female.  She is currently wearing BiPAP on the face and the face looks full and puffy. NECK:  Veins are slightly distended. LUNGS:  Coarse breath sounds diffusely. HEART:  Tachycardic. ABDOMEN:  Mildly  tender, right upper quadrant. EXTREMITIES:  With bilateral amputations with a left below-knee amputation, right above-knee amputation. NEUROLOGIC:  She is oriented in all spheres with no evidence of focality.  LABORATORY STUDIES:  Hemoglobin 13.7 g, white blood count 4810, and platelets 223,000.  BUN 71, creatinine 12.0, potassium 3.8, and glucose 341.  Chest x-ray:  Pulmonary edema, questionable right lower lobe infiltrate, cardiomegaly.  Troponin elevated at 0.11.  ASSESSMENT: 1. Pulmonary edema secondary to volume excess; rule out myocardial     ischemia. 2. Elevated troponins, possibly secondary to demand ischemia.  Rule     out significant coronary artery disease. 3. End-stage renal disease. 4. Diabetes mellitus, out of control. 5. Uncontrolled hypertension. 6. Possible bronchitis.  PLAN: 1. Urgent hemodialysis tonight for volume overload. 2. Serial cardiac enzymes. 3. We will wean off BiPAP. 4. Doxycycline by mouth. 5. Reconcile medications in the morning.          ______________________________ Mindi Slicker. Lowell Guitar, M.D.     ACP/MEDQ  D:  06/20/2011  T:  06/20/2011  Job:  [id number]  Electronically Signed by Casimiro Needle M.D. on 06/28/2011 10:43:21 AM

## 2011-07-07 ENCOUNTER — Other Ambulatory Visit (HOSPITAL_COMMUNITY): Payer: Self-pay | Admitting: Nephrology

## 2011-07-07 DIAGNOSIS — N186 End stage renal disease: Secondary | ICD-10-CM

## 2011-07-12 ENCOUNTER — Ambulatory Visit (HOSPITAL_COMMUNITY)
Admission: RE | Admit: 2011-07-12 | Discharge: 2011-07-12 | Disposition: A | Discharge status: Home / Self Care | Payer: Medicare Other | Source: Ambulatory Visit | Attending: Nephrology | Admitting: Nephrology

## 2011-07-12 ENCOUNTER — Other Ambulatory Visit (HOSPITAL_COMMUNITY): Payer: Self-pay | Admitting: Nephrology

## 2011-07-12 ENCOUNTER — Telehealth (HOSPITAL_COMMUNITY): Payer: Self-pay | Admitting: Neurology

## 2011-07-12 ENCOUNTER — Encounter (HOSPITAL_COMMUNITY): Payer: Self-pay

## 2011-07-12 VITALS — BP 190/84 | HR 72 | Resp 18

## 2011-07-12 DIAGNOSIS — T82898A Other specified complication of vascular prosthetic devices, implants and grafts, initial encounter: Secondary | ICD-10-CM | POA: Insufficient documentation

## 2011-07-12 DIAGNOSIS — Y832 Surgical operation with anastomosis, bypass or graft as the cause of abnormal reaction of the patient, or of later complication, without mention of misadventure at the time of the procedure: Secondary | ICD-10-CM | POA: Insufficient documentation

## 2011-07-12 DIAGNOSIS — N186 End stage renal disease: Secondary | ICD-10-CM

## 2011-07-12 HISTORY — DX: Peripheral vascular disease, unspecified: I73.9

## 2011-07-12 HISTORY — DX: Atherosclerotic heart disease of native coronary artery without angina pectoris: I25.10

## 2011-07-12 HISTORY — DX: Anemia, unspecified: D64.9

## 2011-07-12 HISTORY — DX: Heart failure, unspecified: I50.9

## 2011-07-12 HISTORY — DX: Essential (primary) hypertension: I10

## 2011-07-12 MED ORDER — IOHEXOL 300 MG/ML  SOLN
100.0000 mL | Freq: Once | INTRAMUSCULAR | Status: AC | PRN
  Administered 2011-07-12: 50 mL via INTRAVENOUS

## 2011-07-12 NOTE — Procedures (Signed)
LUE AVF with 7mm cephalic venous PTA at cephalic subclavian junction No comp Stable

## 2011-07-12 NOTE — H&P (Signed)
[name] is an 50 y.o. female.   Chief Complaint: increased flows. R/O stenosis HPI: ESRD with left upper arm fistula. Increased blood flows - for fistulogram with possible pta/stent   Past Medical History  Diagnosis Date  . Hypertension   . Anemia   . Chronic kidney disease   . CHF (congestive heart failure)   . Diabetes mellitus   . Coronary artery disease   . Peripheral vascular disease     Past Surgical History  Procedure Date  . Cardiac catheterization   . Abdominal hysterectomy   . Leg amputation below knee   . Above knee leg amputation   . Av fistula placement   . Vascular surgery     History reviewed. No pertinent family history. Social History:  does not have a smoking history on file. She does not have any smokeless tobacco history on file. Her alcohol and drug histories not on file.  Allergies: No Known Allergies  No current outpatient prescriptions on file as of 07/12/2011.   No current facility-administered medications on file as of 07/12/2011.    No results found for this or any previous visit (from the past 48 hour(s)). No results found.  Review of Systems  Constitutional: Negative for fever and chills.  HENT: Negative.   Respiratory: Negative for cough and shortness of breath.   Cardiovascular: Negative for chest pain and palpitations.    Blood pressure 190/84, pulse 72, resp. rate 18, SpO2 100.00%. Physical Exam  Constitutional: She is oriented to person, place, and time. She appears well-developed.  Cardiovascular: Normal rate and regular rhythm.   Respiratory: Breath sounds normal.  Neurological: She is oriented to person, place, and time.     Assessment/Plan Stenosis noted on imaging - for pta/stent and possible HD cath placement if unsuccessful.  CAMPBELL,PAMELA D 07/12/2011, 8:46 AM

## 2011-08-04 ENCOUNTER — Telehealth: Payer: Self-pay | Admitting: Neurology

## 2011-08-09 ENCOUNTER — Telehealth: Payer: Self-pay | Admitting: Neurology

## 2011-08-09 NOTE — Progress Notes (Signed)
Close encounter 

## 2011-08-09 NOTE — Telephone Encounter (Signed)
Close encounter 

## 2011-08-09 NOTE — Progress Notes (Signed)
Encounter close

## 2011-08-15 NOTE — Telephone Encounter (Signed)
Note:

## 2011-08-15 NOTE — Telephone Encounter (Signed)
Note already written

## 2011-10-09 ENCOUNTER — Encounter (HOSPITAL_COMMUNITY): Payer: Self-pay | Admitting: Emergency Medicine

## 2011-10-09 ENCOUNTER — Other Ambulatory Visit: Payer: Self-pay

## 2011-10-09 ENCOUNTER — Inpatient Hospital Stay (HOSPITAL_COMMUNITY)
Admission: EM | Admit: 2011-10-09 | Discharge: 2011-10-12 | DRG: 291 | Disposition: A | Discharge status: Home / Self Care | Payer: Medicare Other | Attending: Internal Medicine | Admitting: Internal Medicine

## 2011-10-09 ENCOUNTER — Emergency Department (HOSPITAL_COMMUNITY): Payer: Medicare Other

## 2011-10-09 DIAGNOSIS — I120 Hypertensive chronic kidney disease with stage 5 chronic kidney disease or end stage renal disease: Secondary | ICD-10-CM | POA: Diagnosis present

## 2011-10-09 DIAGNOSIS — I2510 Atherosclerotic heart disease of native coronary artery without angina pectoris: Secondary | ICD-10-CM

## 2011-10-09 DIAGNOSIS — I70209 Unspecified atherosclerosis of native arteries of extremities, unspecified extremity: Secondary | ICD-10-CM | POA: Diagnosis present

## 2011-10-09 DIAGNOSIS — N186 End stage renal disease: Secondary | ICD-10-CM

## 2011-10-09 DIAGNOSIS — I501 Left ventricular failure: Secondary | ICD-10-CM | POA: Diagnosis present

## 2011-10-09 DIAGNOSIS — Z79899 Other long term (current) drug therapy: Secondary | ICD-10-CM

## 2011-10-09 DIAGNOSIS — Z794 Long term (current) use of insulin: Secondary | ICD-10-CM

## 2011-10-09 DIAGNOSIS — Z992 Dependence on renal dialysis: Secondary | ICD-10-CM

## 2011-10-09 DIAGNOSIS — R0902 Hypoxemia: Secondary | ICD-10-CM | POA: Diagnosis present

## 2011-10-09 DIAGNOSIS — S88119A Complete traumatic amputation at level between knee and ankle, unspecified lower leg, initial encounter: Secondary | ICD-10-CM

## 2011-10-09 DIAGNOSIS — D631 Anemia in chronic kidney disease: Secondary | ICD-10-CM | POA: Diagnosis present

## 2011-10-09 DIAGNOSIS — D649 Anemia, unspecified: Secondary | ICD-10-CM

## 2011-10-09 DIAGNOSIS — N2581 Secondary hyperparathyroidism of renal origin: Secondary | ICD-10-CM | POA: Diagnosis present

## 2011-10-09 DIAGNOSIS — I10 Essential (primary) hypertension: Secondary | ICD-10-CM

## 2011-10-09 DIAGNOSIS — N039 Chronic nephritic syndrome with unspecified morphologic changes: Secondary | ICD-10-CM | POA: Diagnosis present

## 2011-10-09 DIAGNOSIS — I739 Peripheral vascular disease, unspecified: Secondary | ICD-10-CM

## 2011-10-09 DIAGNOSIS — E119 Type 2 diabetes mellitus without complications: Secondary | ICD-10-CM

## 2011-10-09 DIAGNOSIS — J811 Chronic pulmonary edema: Secondary | ICD-10-CM

## 2011-10-09 DIAGNOSIS — S78119A Complete traumatic amputation at level between unspecified hip and knee, initial encounter: Secondary | ICD-10-CM

## 2011-10-09 DIAGNOSIS — I509 Heart failure, unspecified: Secondary | ICD-10-CM

## 2011-10-09 LAB — POCT I-STAT, CHEM 8
Creatinine, Ser: 10.7 mg/dL — ABNORMAL HIGH (ref 0.50–1.10)
Glucose, Bld: 234 mg/dL — ABNORMAL HIGH (ref 70–99)
Hemoglobin: 13.6 g/dL (ref 12.0–15.0)
TCO2: 27 mmol/L (ref 0–100)

## 2011-10-09 LAB — DIFFERENTIAL
Eosinophils Absolute: 0.3 10*3/uL (ref 0.0–0.7)
Eosinophils Relative: 4 % (ref 0–5)
Lymphs Abs: 2.6 10*3/uL (ref 0.7–4.0)
Monocytes Relative: 5 % (ref 3–12)

## 2011-10-09 LAB — CBC
Hemoglobin: 12.5 g/dL (ref 12.0–15.0)
MCH: 27.9 pg (ref 26.0–34.0)
MCV: 86.4 fL (ref 78.0–100.0)
RBC: 4.48 MIL/uL (ref 3.87–5.11)

## 2011-10-09 MED ORDER — NITROGLYCERIN IN D5W 200-5 MCG/ML-% IV SOLN
5.0000 ug/min | INTRAVENOUS | Status: DC
  Administered 2011-10-10: 10 ug/min via INTRAVENOUS
  Filled 2011-10-09: qty 250

## 2011-10-09 NOTE — ED Provider Notes (Signed)
History     CSN: [id number]  Arrival date & time 10/09/11  2250   First MD Initiated Contact with Patient 10/09/11 2303      Chief Complaint  Patient presents with  . Shortness of Breath    (Consider location/radiation/quality/duration/timing/severity/associated sxs/prior treatment) Patient is a 51 y.o. female presenting with shortness of breath. The history is provided by the patient.  Shortness of Breath  The current episode started today. The problem has been gradually worsening. The problem is moderate. The symptoms are relieved by nothing. Associated symptoms include chest pain, cough and shortness of breath.   shortness of breath and cough that began this evening. She is a dialysis patient last had dialysis on Saturday. She states her run was 15 minutes short due to hypotension. She now has chest pain headache and cough. She was found to be hypertensive and hypoxic in triage. No production with the cough. She states she's had some chills. The pain is constant. The headache is throbbing.  Past Medical History  Diagnosis Date  . Hypertension   . Anemia   . Chronic kidney disease   . CHF (congestive heart failure)   . Diabetes mellitus   . Coronary artery disease   . Peripheral vascular disease     Past Surgical History  Procedure Date  . Cardiac catheterization   . Abdominal hysterectomy   . Leg amputation below knee   . Above knee leg amputation   . Av fistula placement   . Vascular surgery     No family history on file.  History  Substance Use Topics  . Smoking status: Never Smoker   . Smokeless tobacco: Not on file  . Alcohol Use: No    OB History    Grav Para Term Preterm Abortions TAB SAB Ect Mult Living                  Review of Systems  Constitutional: Negative for activity change and appetite change.  HENT: Negative for neck stiffness.   Eyes: Negative for pain.  Respiratory: Positive for cough and shortness of breath. Negative for chest  tightness.   Cardiovascular: Positive for chest pain. Negative for leg swelling.  Gastrointestinal: Negative for nausea, vomiting, abdominal pain and diarrhea.  Genitourinary: Negative for flank pain.  Musculoskeletal: Negative for back pain.  Skin: Negative for rash.  Neurological: Positive for headaches. Negative for weakness and numbness.  Psychiatric/Behavioral: Negative for behavioral problems.    Allergies  Review of patient's allergies indicates no known allergies.  Home Medications   Current Outpatient Rx  Name Route Sig Dispense Refill  . AMLODIPINE BESYLATE 10 MG PO TABS Oral Take 10 mg by mouth daily.    Marland Kitchen CINACALCET HCL 30 MG PO TABS Oral Take 30 mg by mouth daily.    . INSULIN GLARGINE 100 UNIT/ML [place] SOLN Subcutaneous Inject 25 Units into the skin at bedtime.    Marland Kitchen LISINOPRIL 40 MG PO TABS Oral Take 40 mg by mouth daily.    Marland Kitchen LOSARTAN POTASSIUM 50 MG PO TABS Oral Take 50 mg by mouth daily.    Marland Kitchen METOPROLOL TARTRATE 50 MG PO TABS Oral Take 50 mg by mouth 2 (two) times daily.      BP 211/128  Pulse 144  Temp(Src) 98.1 F (36.7 C) (Oral)  Resp 28  SpO2 98%  Physical Exam  Constitutional: She is oriented to person, place, and time. She appears well-developed.  HENT:  Head: Normocephalic.  Eyes: Pupils are equal,  round, and reactive to light.  Neck: Normal range of motion.  Cardiovascular:       Tachycardia  Pulmonary/Chest: She is in respiratory distress.       Mild respiratory distress, tachypnea. Minimal rales. Decreased air movement overall  Abdominal: Soft. She exhibits no distension. There is no tenderness.  Musculoskeletal:       Right above-the-knee amputation. Left below-the-knee of dictation. Left upper extremity dialysis graft.  Neurological: She is alert and oriented to person, place, and time.       Right eye chronically deviated laterally.  Skin: Skin is warm.  Psychiatric: She has a normal mood and affect.    ED Course  Procedures (including  critical care time)  Labs Reviewed  POCT I-STAT, CHEM 8 - Abnormal; Notable for the following:    Sodium 134 (*)    BUN 58 (*)    Creatinine, Ser 10.70 (*)    Glucose, Bld 234 (*)    Calcium, Ion 1.07 (*)    All other components within normal limits  POCT I-STAT TROPONIN I - Abnormal; Notable for the following:    Troponin i, poc 0.09 (*)    All other components within normal limits  CBC  DIFFERENTIAL   Dg Chest Port 1 View  10/09/2011  *RADIOLOGY REPORT*  Clinical Data: Shortness of breath  PORTABLE CHEST - 1 VIEW  Comparison: 06/23/2011  Findings: New moderate interstitial and airspace edema bilaterally, with peripheral septal lines evident.  No definite effusion.  Mild cardiomegaly.  Atheromatous aortic arch.  IMPRESSION: Mild cardiomegaly with new bilateral edema.  Original Report Authenticated By: Thora Lance III, M.D.     1. CHF (congestive heart failure)   2. Pulmonary edema   3. End stage renal disease on dialysis      Date: 10/09/2011  Rate: 135  Rhythm: sinus tachycardia  QRS Axis: normal  Intervals: normal  ST/T Wave abnormalities: nonspecific T wave changes  Conduction Disutrbances:none  Narrative Interpretation:   Old EKG Reviewed: unchanged  CRITICAL CARE Performed by: Billee Cashing   Total critical care time: 30  Critical care time was exclusive of separately billable procedures and treating other patients.  Critical care was necessary to treat or prevent imminent or life-threatening deterioration.  Critical care was time spent personally by me on the following activities: development of treatment plan with patient and/or surrogate as well as nursing, discussions with consultants, evaluation of patient's response to treatment, examination of patient, obtaining history from patient or surrogate, ordering and performing treatments and interventions, ordering and review of laboratory studies, ordering and review of radiographic studies, pulse  oximetry and re-evaluation of patient's condition.  MDM  Shortness of breath and hypoxia. Patient is dialysis patient last dialyzed on Saturday. She appears to be in hypertensive urgency/emergency. CHF on x-ray. Room air sats in the 80s. She will need urgent to emergent dialysis. I discussed case with Dr. Caryn Section, we'll dialyze the patient tonight, but this intubated patient will be dialyzed first. He requested admission to medicine with him as a consult. I discussed the patient with Dr. Conley Rolls, he will admit the patient. Patient was difficult IV access. Initially sublingual nitroglycerin given. IV nitroglycerin was started. Her troponin is minimally elevated. Likely due to renal disease and strain due to the hypertension.        Juliet Rude. Rubin Payor, MD 10/10/11 [id number]

## 2011-10-09 NOTE — ED Notes (Signed)
PT. REPORTS SOB WITH CHEST TIGHTNESS AND PRODUCTIVE COUGH THIS EVENING , HEMODIALYSIS TUE/THURS/ SAT. DENIES FEVER OR CHILLS.

## 2011-10-09 NOTE — ED Notes (Signed)
Portable xray at bedside.

## 2011-10-09 NOTE — ED Notes (Signed)
IV team paged.  

## 2011-10-10 ENCOUNTER — Emergency Department (HOSPITAL_COMMUNITY): Payer: Medicare Other

## 2011-10-10 ENCOUNTER — Encounter (HOSPITAL_COMMUNITY): Payer: Self-pay | Admitting: Internal Medicine

## 2011-10-10 DIAGNOSIS — I2510 Atherosclerotic heart disease of native coronary artery without angina pectoris: Secondary | ICD-10-CM | POA: Diagnosis present

## 2011-10-10 DIAGNOSIS — I10 Essential (primary) hypertension: Secondary | ICD-10-CM | POA: Diagnosis present

## 2011-10-10 DIAGNOSIS — I501 Left ventricular failure: Secondary | ICD-10-CM | POA: Diagnosis present

## 2011-10-10 DIAGNOSIS — E119 Type 2 diabetes mellitus without complications: Secondary | ICD-10-CM | POA: Diagnosis present

## 2011-10-10 DIAGNOSIS — D649 Anemia, unspecified: Secondary | ICD-10-CM | POA: Diagnosis present

## 2011-10-10 DIAGNOSIS — N186 End stage renal disease: Secondary | ICD-10-CM | POA: Diagnosis present

## 2011-10-10 DIAGNOSIS — I739 Peripheral vascular disease, unspecified: Secondary | ICD-10-CM | POA: Diagnosis present

## 2011-10-10 LAB — HEMOGLOBIN A1C
Hgb A1c MFr Bld: 10.1 % — ABNORMAL HIGH (ref <5.7)
Mean Plasma Glucose: 243 mg/dL — ABNORMAL HIGH (ref <117)

## 2011-10-10 LAB — GLUCOSE, CAPILLARY
Glucose-Capillary: 181 mg/dL — ABNORMAL HIGH (ref 70–99)
Glucose-Capillary: 161 mg/dL — ABNORMAL HIGH (ref 70–99)

## 2011-10-10 LAB — CARDIAC PANEL(CRET KIN+CKTOT+MB+TROPI)
Relative Index: INVALID (ref 0.0–2.5)
Relative Index: INVALID (ref 0.0–2.5)
Total CK: 111 U/L (ref 7–177)
Troponin I: 0.34 ng/mL (ref <0.30)
Troponin I: 0.41 ng/mL (ref <0.30)
Troponin I: 0.31 ng/mL (ref <0.30)

## 2011-10-10 LAB — CREATININE, SERUM
Creatinine, Ser: 5.85 mg/dL — ABNORMAL HIGH (ref 0.50–1.10)
GFR calc non Af Amer: 8 mL/min — ABNORMAL LOW (ref >90)

## 2011-10-10 LAB — CBC
MCHC: 32.4 g/dL (ref 30.0–36.0)
RDW: 14.6 % (ref 11.5–15.5)

## 2011-10-10 MED ORDER — LIDOCAINE HCL (PF) 1 % IJ SOLN: 5.0000 mL | INTRAMUSCULAR | Status: DC | PRN

## 2011-10-10 MED ORDER — SODIUM CHLORIDE 0.9 % IV SOLN: 100.0000 mL | INTRAVENOUS | Status: DC | PRN

## 2011-10-10 MED ORDER — HYDROCODONE-HOMATROPINE 5-1.5 MG/5ML PO SYRP
5.0000 mL | ORAL_SOLUTION | ORAL | Status: DC | PRN
  Administered 2011-10-10 – 2011-10-12 (×4): 5 mL via ORAL
  Filled 2011-10-10 (×4): qty 5

## 2011-10-10 MED ORDER — DOCUSATE SODIUM 283 MG RE ENEM
1.0000 | ENEMA | RECTAL | Status: DC | PRN
  Filled 2011-10-10: qty 1

## 2011-10-10 MED ORDER — NITROGLYCERIN 0.4 MG SL SUBL
0.4000 mg | SUBLINGUAL_TABLET | Freq: Once | SUBLINGUAL | Status: AC
  Administered 2011-10-10: 0.4 mg via SUBLINGUAL
  Filled 2011-10-10: qty 25

## 2011-10-10 MED ORDER — SODIUM CHLORIDE 0.9 % IV SOLN: 250.0000 mL | INTRAVENOUS | Status: DC | PRN

## 2011-10-10 MED ORDER — ONDANSETRON HCL 4 MG PO TABS: 4.0000 mg | ORAL_TABLET | Freq: Four times a day (QID) | ORAL | Status: DC | PRN

## 2011-10-10 MED ORDER — AMLODIPINE BESYLATE 10 MG PO TABS
10.0000 mg | ORAL_TABLET | Freq: Every day | ORAL | Status: DC
  Administered 2011-10-10 – 2011-10-11 (×2): 10 mg via ORAL
  Filled 2011-10-10 (×3): qty 1

## 2011-10-10 MED ORDER — CALCIUM ACETATE (PHOS BINDER) 667 MG/5ML PO SOLN
2668.0000 mg | Freq: Three times a day (TID) | ORAL | Status: DC
  Administered 2011-10-10: 133.4 mg via ORAL
  Administered 2011-10-10 – 2011-10-11 (×4): 2668 mg via ORAL
  Filled 2011-10-10 (×10): qty 20

## 2011-10-10 MED ORDER — RENA-VITE PO TABS
1.0000 | ORAL_TABLET | Freq: Every day | ORAL | Status: DC
  Administered 2011-10-10 – 2011-10-11 (×2): 1 via ORAL
  Filled 2011-10-10 (×3): qty 1

## 2011-10-10 MED ORDER — CINACALCET HCL 30 MG PO TABS
60.0000 mg | ORAL_TABLET | Freq: Every day | ORAL | Status: DC
  Administered 2011-10-10 – 2011-10-11 (×2): 60 mg via ORAL
  Filled 2011-10-10 (×4): qty 2

## 2011-10-10 MED ORDER — MORPHINE SULFATE 2 MG/ML IJ SOLN: 2.0000 mg | INTRAMUSCULAR | Status: DC | PRN

## 2011-10-10 MED ORDER — METOPROLOL TARTRATE 50 MG PO TABS
50.0000 mg | ORAL_TABLET | Freq: Two times a day (BID) | ORAL | Status: DC
  Administered 2011-10-10 – 2011-10-11 (×4): 50 mg via ORAL
  Filled 2011-10-10 (×6): qty 1

## 2011-10-10 MED ORDER — CALCIUM CARBONATE 1250 MG/5ML PO SUSP
500.0000 mg | Freq: Four times a day (QID) | ORAL | Status: DC | PRN
  Filled 2011-10-10: qty 5

## 2011-10-10 MED ORDER — ONDANSETRON HCL 4 MG/2ML IJ SOLN
4.0000 mg | Freq: Four times a day (QID) | INTRAMUSCULAR | Status: DC | PRN
  Administered 2011-10-10: 4 mg via INTRAVENOUS

## 2011-10-10 MED ORDER — ONDANSETRON HCL 4 MG/2ML IJ SOLN: 4.0000 mg | Freq: Four times a day (QID) | INTRAMUSCULAR | Status: DC | PRN

## 2011-10-10 MED ORDER — ONDANSETRON HCL 8 MG PO TABS
4.0000 mg | ORAL_TABLET | Freq: Four times a day (QID) | ORAL | Status: DC | PRN
  Filled 2011-10-10: qty 0.5

## 2011-10-10 MED ORDER — CAMPHOR-MENTHOL 0.5-0.5 % EX LOTN: 1.0000 "application " | TOPICAL_LOTION | Freq: Three times a day (TID) | CUTANEOUS | Status: DC | PRN

## 2011-10-10 MED ORDER — PENTAFLUOROPROP-TETRAFLUOROETH EX AERO: 1.0000 "application " | INHALATION_SPRAY | CUTANEOUS | Status: DC | PRN

## 2011-10-10 MED ORDER — INSULIN GLARGINE 100 UNIT/ML ~~LOC~~ SOLN
25.0000 [IU] | Freq: Every day | SUBCUTANEOUS | Status: DC
  Administered 2011-10-10: 25 [IU] via SUBCUTANEOUS
  Filled 2011-10-10: qty 3

## 2011-10-10 MED ORDER — LIDOCAINE-PRILOCAINE 2.5-2.5 % EX CREA: 1.0000 "application " | TOPICAL_CREAM | CUTANEOUS | Status: DC | PRN

## 2011-10-10 MED ORDER — SODIUM CHLORIDE 0.9 % IJ SOLN: 3.0000 mL | INTRAMUSCULAR | Status: DC | PRN

## 2011-10-10 MED ORDER — ALTEPLASE 2 MG IJ SOLR
2.0000 mg | Freq: Once | INTRAMUSCULAR | Status: AC | PRN
  Filled 2011-10-10: qty 2

## 2011-10-10 MED ORDER — HYDROXYZINE HCL 25 MG PO TABS
25.0000 mg | ORAL_TABLET | Freq: Three times a day (TID) | ORAL | Status: DC | PRN
  Filled 2011-10-10: qty 1

## 2011-10-10 MED ORDER — HEPARIN SODIUM (PORCINE) 1000 UNIT/ML DIALYSIS
1200.0000 [IU] | INTRAMUSCULAR | Status: DC | PRN
  Administered 2011-10-10 (×2): 1200 [IU] via INTRAVENOUS_CENTRAL
  Filled 2011-10-10: qty 2

## 2011-10-10 MED ORDER — LOSARTAN POTASSIUM 50 MG PO TABS
50.0000 mg | ORAL_TABLET | Freq: Every day | ORAL | Status: DC
Start: 2011-10-10 — End: 2011-10-12
  Administered 2011-10-10 – 2011-10-11 (×2): 50 mg via ORAL
  Filled 2011-10-10 (×3): qty 1

## 2011-10-10 MED ORDER — LABETALOL HCL 5 MG/ML IV SOLN
20.0000 mg | Freq: Once | INTRAVENOUS | Status: AC
  Administered 2011-10-10: 20 mg via INTRAVENOUS
  Filled 2011-10-10: qty 4

## 2011-10-10 MED ORDER — ACETAMINOPHEN 650 MG RE SUPP: 650.0000 mg | Freq: Four times a day (QID) | RECTAL | Status: DC | PRN

## 2011-10-10 MED ORDER — ALPRAZOLAM 0.5 MG PO TABS
ORAL_TABLET | ORAL | Status: AC
  Administered 2011-10-10: 0.25 mg via ORAL
  Filled 2011-10-10: qty 1

## 2011-10-10 MED ORDER — INSULIN ASPART 100 UNIT/ML ~~LOC~~ SOLN
0.0000 [IU] | Freq: Three times a day (TID) | SUBCUTANEOUS | Status: DC
  Administered 2011-10-10: 2 [IU] via SUBCUTANEOUS
  Administered 2011-10-10: 1 [IU] via SUBCUTANEOUS
  Administered 2011-10-11: 3 [IU] via SUBCUTANEOUS
  Administered 2011-10-11: 2 [IU] via SUBCUTANEOUS
  Filled 2011-10-10: qty 3

## 2011-10-10 MED ORDER — SORBITOL 70 % SOLN
30.0000 mL | Status: DC | PRN
  Filled 2011-10-10: qty 30

## 2011-10-10 MED ORDER — SODIUM CHLORIDE 0.9 % IJ SOLN
3.0000 mL | Freq: Two times a day (BID) | INTRAMUSCULAR | Status: DC
  Administered 2011-10-10 – 2011-10-11 (×4): 3 mL via INTRAVENOUS

## 2011-10-10 MED ORDER — HEPARIN SODIUM (PORCINE) 5000 UNIT/ML IJ SOLN
5000.0000 [IU] | Freq: Three times a day (TID) | INTRAMUSCULAR | Status: DC
  Administered 2011-10-10 – 2011-10-12 (×6): 5000 [IU] via SUBCUTANEOUS
  Filled 2011-10-10 (×10): qty 1

## 2011-10-10 MED ORDER — HEPARIN SODIUM (PORCINE) 1000 UNIT/ML DIALYSIS
1000.0000 [IU] | INTRAMUSCULAR | Status: DC | PRN
  Administered 2011-10-12: 1300 [IU] via INTRAVENOUS_CENTRAL
  Filled 2011-10-10: qty 1

## 2011-10-10 MED ORDER — LISINOPRIL 40 MG PO TABS
40.0000 mg | ORAL_TABLET | Freq: Every day | ORAL | Status: DC
  Administered 2011-10-10 – 2011-10-11 (×2): 40 mg via ORAL
  Filled 2011-10-10 (×2): qty 1

## 2011-10-10 MED ORDER — NEPRO/CARBSTEADY PO LIQD
237.0000 mL | ORAL | Status: DC | PRN
  Filled 2011-10-10: qty 237

## 2011-10-10 MED ORDER — NEPRO/CARBSTEADY PO LIQD
237.0000 mL | Freq: Three times a day (TID) | ORAL | Status: DC | PRN
  Filled 2011-10-10: qty 237

## 2011-10-10 MED ORDER — ONDANSETRON HCL 4 MG/2ML IJ SOLN
INTRAMUSCULAR | Status: AC
  Administered 2011-10-10: 4 mg via INTRAVENOUS
  Filled 2011-10-10: qty 2

## 2011-10-10 MED ORDER — CINACALCET HCL 30 MG PO TABS: 30.0000 mg | ORAL_TABLET | Freq: Every day | ORAL | Status: DC

## 2011-10-10 MED ORDER — HYDROCODONE-HOMATROPINE 5-1.5 MG/5ML PO SYRP
5.0000 mL | ORAL_SOLUTION | ORAL | Status: AC
  Administered 2011-10-10: 5 mL via ORAL
  Filled 2011-10-10: qty 5

## 2011-10-10 MED ORDER — ZOLPIDEM TARTRATE 5 MG PO TABS: 5.0000 mg | ORAL_TABLET | Freq: Every evening | ORAL | Status: DC | PRN

## 2011-10-10 MED ORDER — ALPRAZOLAM 0.25 MG PO TABS
0.2500 mg | ORAL_TABLET | Freq: Once | ORAL | Status: AC
  Administered 2011-10-10: 0.25 mg via ORAL

## 2011-10-10 MED ORDER — ACETAMINOPHEN 325 MG PO TABS: 650.0000 mg | ORAL_TABLET | Freq: Four times a day (QID) | ORAL | Status: DC | PRN

## 2011-10-10 NOTE — Consult Note (Addendum)
Reason for Consult:Dialysis patient with pulmonary edema Referring Physician: Dr. Tomma [name] [name] is a 51 yo B woman who is on dialysis T-Th-Sat at [facility].  She comes to ER with orthopnea and SOB.  CXR-pulm edema Usual HD 4"15", EDW 57.5 kg, 2K 2.5 Ca bath,zemplar 3 mcg TIW, no EPO or IV Fe Last Hgb 10.9, Fe/TIBC 26%, ferritin 1184 on 28 Sep 2011 Ca/PO4 9/7.2, PTH 755 on 28 Sep 2011   Past Medical History  Diagnosis Date  . Hypertension   . Anemia   . Chronic kidney disease   . CHF (congestive heart failure)   . Diabetes mellitus   . Coronary artery disease   . Peripheral vascular disease   On HD since 2004  No family history on file.Father died 83 of "brain tumor." Mother died 19 of "renal failure." 1 sister died MI age 48, one died of "cancer" age 12.  2 sis, 4 bros healthy.  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 labetalol  20 mg Intravenous Once  . nitroGLYCERIN  0.4 mg Sublingual Once   Continuous:   . nitroGLYCERIN 10 mcg/min (10/10/11 0035)   Lisinopril 40/d, losartan 50 HS, Lantus insulin 25 HS, amlodipine 10 HS, Metoprolol 50 BID, Sensipar 60/d, Phoslyra 20 cc TID with meals, nephrovite QD,  Asa 81/d  Results for orders placed during the hospital encounter of 10/09/11 (from the past 48 hour(s))  CBC     Status: Normal   Collection Time   10/09/11 11:13 PM      Component Value Range Comment   WBC 8.8  4.0 - 10.5 (K/uL)    RBC 4.48  3.87 - 5.11 (MIL/uL)    Hemoglobin 12.5  12.0 - 15.0 (g/dL)    HCT 16.1  09.6 - 04.5 (%)    MCV 86.4  78.0 - 100.0 (fL)    MCH 27.9  26.0 - 34.0 (pg)    MCHC 32.3  30.0 - 36.0 (g/dL)    RDW 40.9  81.1 - 91.4 (%)    Platelets 296  150 - 400 (K/uL)     DIFFERENTIAL     Status: Normal   Collection Time   10/09/11 11:13 PM      Component Value Range Comment   Neutrophils Relative 61  43 - 77 (%)    Neutro Abs 5.4  1.7 - 7.7 (K/uL)    Lymphocytes Relative 30  12 - 46 (%)    Lymphs Abs 2.6  0.7 - 4.0 (K/uL)    Monocytes Relative 5  3 - 12 (%)    Monocytes Absolute 0.4  0.1 - 1.0 (K/uL)    Eosinophils Relative 4  0 - 5 (%)    Eosinophils Absolute 0.3  0.0 - 0.7 (K/uL)    Basophils Relative 1  0 - 1 (%)    Basophils Absolute 0.1  0.0 - 0.1 (K/uL)   POCT I-STAT TROPONIN I     Status: Abnormal   Collection Time   10/09/11 11:25 PM      Component Value Range Comment   Troponin i, poc 0.09 (*) 0.00 - 0.08 (ng/mL)    Comment NOTIFIED PHYSICIAN      Comment 3  POCT I-STAT, CHEM 8     Status: Abnormal   Collection Time   10/09/11 11:27 PM      Component Value Range Comment   Sodium 134 (*) 135 - 145 (mEq/L)    Potassium 4.0  3.5 - 5.1 (mEq/L)    Chloride 99  96 - 112 (mEq/L)    BUN 58 (*) 6 - 23 (mg/dL)    Creatinine, Ser 16.10 (*) 0.50 - 1.10 (mg/dL)    Glucose, Bld 960 (*) 70 - 99 (mg/dL)    Calcium, Ion 4.54 (*) 1.12 - 1.32 (mmol/L)    TCO2 27  0 - 100 (mmol/L)    Hemoglobin 13.6  12.0 - 15.0 (g/dL)    HCT 09.8  11.9 - 14.7 (%)     Dg Chest Port 1 View  10/09/2011  *RADIOLOGY REPORT*  Clinical Data: Shortness of breath  PORTABLE CHEST - 1 VIEW  Comparison: 06/23/2011  Findings: New moderate interstitial and airspace edema bilaterally, with peripheral septal lines evident.  No definite effusion.  Mild cardiomegaly.  Atheromatous aortic arch.  IMPRESSION: Mild cardiomegaly with new bilateral edema.  Original Report Authenticated By: [name], M.D.    ROS-+ doe, + orthopnea no angina, no claudication, no melena, no hematochezia, no gross hematuria, no renal colic,  no nocturia, no purulent sputum, no hemoptysis, no weight gain or weight loss, no cold or heat intolerance. Blood pressure 197/102, pulse 144, temperature  97.6 F (36.4 C), temperature source Oral, resp. rate 22, SpO2 100.00%. Chest-crackles Heart-no rub Abd-nontender Extr-R AKA, L BKA, AVG patent L uper arm Neuro-R handed   Assessment/Plan: 1.  Pulm Edema--emergency HD tonight 2.  High BP-restart meds.  Dr. Conley [name] has given labetalol in ER.  BP should decrease as fluid is removed Not sure why she's on both lisinopril(ACE inhib) AND losartan (A-II blocker) 3.  Secondary PTH--continue phos binder, zemplar, sensipar 4.  DM--continue insulin 5.  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  ESRD--see above  [name] F 10/10/2011, 12:49 AM

## 2011-10-10 NOTE — Progress Notes (Signed)
I have examined the patient and reviewed the chart. Will check an ECHO to see if she has heart failure. Per history she has CHF but there is no recent ECHO. We will cont to follow to see if she needs further dialysis. BP better controlled. May need to increase Lopressor if it BP [place]ates [place] [id number]

## 2011-10-10 NOTE — Progress Notes (Signed)
  Echocardiogram 2D Echocardiogram has been performed.  [name] [facility] 10/10/2011, 3:03 PM

## 2011-10-10 NOTE — ED Notes (Signed)
Pt states that she woke from sleep PTA and was short of breath and her chest "didn't feel right".  O2 sats in triage were 89% on RA.  Dialysis pt, AV fistula in left arm.

## 2011-10-10 NOTE — H&P (Signed)
PCP:   Cecille Aver, MD, MD   Chief Complaint: Progressive shortness of breath.   HPI: [name] is an 51 y.o. female with history of end-stage renal disease on hemodialysis, hypertension, anemia, diabetes, prior congestive heart failure, peripheral vascular disease, status post bilateral leg amputations, presents to emergency room because of progressive shortness of breath. She is due to have her routine dialysis later on today, but came to the emergency room because of shortness of breath. Evaluation in the emergency room included creatinine of 10.70, potassium of 4.0, troponin of 0.09. Her chest x-ray showed by basilar edema which is new. In the emergency room, she was found to have severe hypertension with systolic blood pressure of 220 over diastolic of 110. Hospitalist was asked to admit the patient because of volume overload.  Rewiew of Systems:  The patient denies anorexia, fever, weight loss,, vision loss, decreased hearing, hoarseness, chest pain, syncope, , peripheral edema, balance deficits, hemoptysis, abdominal pain, melena, hematochezia, severe indigestion/heartburn, hematuria, incontinence, genital sores, muscle weakness, suspicious skin lesions, transient blindness, difficulty walking, depression, unusual weight change, abnormal bleeding, enlarged lymph nodes, angioedema, and breast masses   Past Medical History  Diagnosis Date  . Hypertension   . Anemia   . Chronic kidney disease   . CHF (congestive heart failure)   . Diabetes mellitus   . Coronary artery disease   . Peripheral vascular disease     Past Surgical History  Procedure Date  . Cardiac catheterization   . Abdominal hysterectomy   . Leg amputation below knee   . Above knee leg amputation   . Av fistula placement   . Vascular surgery     Medications:  HOME MEDS: Prior to Admission medications   Medication Sig Start Date End Date Taking? Authorizing Provider  amLODipine (NORVASC) 10 MG  tablet Take 10 mg by mouth daily.   Yes Historical Provider, MD  cinacalcet (SENSIPAR) 30 MG tablet Take 30 mg by mouth daily.   Yes Historical Provider, MD  insulin glargine (LANTUS) 100 UNIT/ML injection Inject 25 Units into the skin at bedtime.   Yes Historical Provider, MD  lisinopril (PRINIVIL,ZESTRIL) 40 MG tablet Take 40 mg by mouth daily.   Yes Historical Provider, MD  losartan (COZAAR) 50 MG tablet Take 50 mg by mouth daily.   Yes Historical Provider, MD  metoprolol (LOPRESSOR) 50 MG tablet Take 50 mg by mouth 2 (two) times daily.   Yes Historical Provider, MD     Allergies:  No Known Allergies  Social History:   reports that she has never smoked. She does not have any smokeless tobacco history on file. She reports that she does not drink alcohol or use illicit drugs.  Family History: History reviewed. No pertinent family history.   Physical Exam: Filed Vitals:   10/09/11 2307 10/09/11 2308 10/09/11 2319 10/10/11 0047  BP: 243/112  211/128 197/102  Pulse: 144     Temp: 98.3 F (36.8 C)  98.1 F (36.7 C) 97.6 F (36.4 C)  TempSrc: Oral  Oral Oral  Resp: 19  28 22   SpO2: 89% 95% 98% 100%   Blood pressure 197/102, pulse 144, temperature 97.6 F (36.4 C), temperature source Oral, resp. rate 22, SpO2 100.00%.  GEN:  Pleasant  person lying in the stretcher appear short of breath; cooperative with exam PSYCH:  alert and oriented x4; does not appear anxious does not appear depressed; affect is normal HEENT: Mucous membranes pink and anicteric; PERRLA; EOM intact; no cervical  lymphadenopathy nor thyromegaly or carotid bruit; no JVD; Breasts:: Not examined CHEST WALL: No tenderness CHEST: Decreased breath sound bilaterally with bilateral crackles HEART: Regular rate and rhythm; no murmurs rubs or gallops BACK: No kyphosis or scoliosis; no CVA tenderness ABDOMEN: Obese, soft non-tender; no masses, no organomegaly, normal abdominal bowel sounds; no pannus; no intertriginous  candida. Rectal Exam: Not done EXTREMITIES: Trace edema bilaterally. She has bilateral leg amputations Genitalia: not examined PULSES: 2+ and symmetric of both wrists. AV fistula found in her left upper arm. There was palpable thrill. SKIN: Normal hydration no rash or ulceration CNS: Cranial nerves 2-12 grossly intact no focal neurologic deficit   Labs & Imaging Results for orders placed during the hospital encounter of 10/09/11 (from the past 48 hour(s))  CBC     Status: Normal   Collection Time   10/09/11 11:13 PM      Component Value Range Comment   WBC 8.8  4.0 - 10.5 (K/uL)    RBC 4.48  3.87 - 5.11 (MIL/uL)    Hemoglobin 12.5  12.0 - 15.0 (g/dL)    HCT 16.1  09.6 - 04.5 (%)    MCV 86.4  78.0 - 100.0 (fL)    MCH 27.9  26.0 - 34.0 (pg)    MCHC 32.3  30.0 - 36.0 (g/dL)    RDW 40.9  81.1 - 91.4 (%)    Platelets 296  150 - 400 (K/uL)   DIFFERENTIAL     Status: Normal   Collection Time   10/09/11 11:13 PM      Component Value Range Comment   Neutrophils Relative 61  43 - 77 (%)    Neutro Abs 5.4  1.7 - 7.7 (K/uL)    Lymphocytes Relative 30  12 - 46 (%)    Lymphs Abs 2.6  0.7 - 4.0 (K/uL)    Monocytes Relative 5  3 - 12 (%)    Monocytes Absolute 0.4  0.1 - 1.0 (K/uL)    Eosinophils Relative 4  0 - 5 (%)    Eosinophils Absolute 0.3  0.0 - 0.7 (K/uL)    Basophils Relative 1  0 - 1 (%)    Basophils Absolute 0.1  0.0 - 0.1 (K/uL)   POCT I-STAT TROPONIN I     Status: Abnormal   Collection Time   10/09/11 11:25 PM      Component Value Range Comment   Troponin i, poc 0.09 (*) 0.00 - 0.08 (ng/mL)    Comment NOTIFIED PHYSICIAN      Comment 3            POCT I-STAT, CHEM 8     Status: Abnormal   Collection Time   10/09/11 11:27 PM      Component Value Range Comment   Sodium 134 (*) 135 - 145 (mEq/L)    Potassium 4.0  3.5 - 5.1 (mEq/L)    Chloride 99  96 - 112 (mEq/L)    BUN 58 (*) 6 - 23 (mg/dL)    Creatinine, Ser 78.29 (*) 0.50 - 1.10 (mg/dL)    Glucose, Bld 562 (*) 70 - 99 (mg/dL)     Calcium, Ion 1.30 (*) 1.12 - 1.32 (mmol/L)    TCO2 27  0 - 100 (mmol/L)    Hemoglobin 13.6  12.0 - 15.0 (g/dL)    HCT 86.5  78.4 - 69.6 (%)    Dg Chest Port 1 View  10/09/2011  *RADIOLOGY REPORT*  Clinical Data: Shortness of breath  PORTABLE CHEST -  1 VIEW  Comparison: 06/23/2011  Findings: New moderate interstitial and airspace edema bilaterally, with peripheral septal lines evident.  No definite effusion.  Mild cardiomegaly.  Atheromatous aortic arch.  IMPRESSION: Mild cardiomegaly with new bilateral edema.  Original Report Authenticated By: Osa Craver, M.D.      Assessment Present on Admission:  .CKD (chronic kidney disease) requiring chronic dialysis .PVD (peripheral vascular disease) .CAD in native artery   PLAN: Shortness of breath due to volume overload. I spoke with Dr. Marina Gravel who agree that she need urgent dialysis. He will dialyze her tonight. Fortunately, her electrolytes were unremarkable. Her blood pressure is quite elevated, and I will use intravenous beta blocker to get it under better range. She will continue her usual insulin dose, and will get supplemental sensitive scale. She does have troponin in the upper limit of normal, I suspect that this is from the decreased renal clearance. Will continue to cycle her troponins. She is a full code and will be admitted to triad hospitalist service. I will place her in the step down unit.   Other plans as per orders.    [name] 10/10/2011, 1:49 AM

## 2011-10-11 LAB — GLUCOSE, CAPILLARY: Glucose-Capillary: 80 mg/dL (ref 70–99)

## 2011-10-11 LAB — BASIC METABOLIC PANEL
BUN: 19 mg/dL (ref 6–23)
CO2: 29 mEq/L (ref 19–32)
Calcium: 9.5 mg/dL (ref 8.4–10.5)
Creatinine, Ser: 5.01 mg/dL — ABNORMAL HIGH (ref 0.50–1.10)
Glucose, Bld: 262 mg/dL — ABNORMAL HIGH (ref 70–99)

## 2011-10-11 LAB — CBC
MCH: 28 pg (ref 26.0–34.0)
MCHC: 31.3 g/dL (ref 30.0–36.0)
MCV: 89.6 fL (ref 78.0–100.0)
Platelets: 204 10*3/uL (ref 150–400)
RDW: 14.9 % (ref 11.5–15.5)

## 2011-10-11 MED ORDER — INSULIN GLARGINE 100 UNIT/ML ~~LOC~~ SOLN
27.0000 [IU] | Freq: Every day | SUBCUTANEOUS | Status: DC
  Administered 2011-10-11: 27 [IU] via SUBCUTANEOUS

## 2011-10-11 NOTE — Progress Notes (Signed)
Utilization Review Completed.Wyndell Cardiff T2/02/2012   

## 2011-10-11 NOTE — Progress Notes (Signed)
Subjective: Interval History:  Was dialyzed X 2 yesterday - finished up early yesterday AM; back to HD last PM; weight down 61.1 to 59.5 (dry is 57) Feels better, not SOB now, no chest pain Objective:  Vital signs in last 24 hours:  Temp:  [97.4 F (36.3 C)-98.5 F (36.9 C)] 98.5 F (36.9 C) (02/06 0752) Pulse Rate:  [61-106] 81  (02/05 2233) Resp:  [16-28] 18  (02/05 2015) BP: (72-161)/(40-87) 158/87 mmHg (02/06 0752) SpO2:  [85 %-100 %] 100 % (02/05 2015) Weight:  [59.5 kg (131 lb 2.8 oz)-60.8 kg (134 lb 0.6 oz)] 60.8 kg (134 lb 0.6 oz) (02/06 0400) Weight change: -1.6 kg (-3 lb 8.4 oz)  Intake/Output: I/O last 3 completed shifts: In: 423 [P.O.:420; I.V.:3] Out: 4209 [Other:4209]  Intake/Output this shift:    SCHEDULED MEDS (REVIEWED) . ALPRAZolam  0.25 mg Oral Once  . amLODipine  10 mg Oral Daily  . calcium acetate (Phos Binder)  2,668 mg Oral TID WC  . cinacalcet  60 mg Oral Q breakfast  . heparin  5,000 Units Subcutaneous Q8H  . insulin aspart  0-9 Units Subcutaneous TID WC  . insulin glargine  25 Units Subcutaneous QHS  . lisinopril  40 mg Oral Daily  . losartan  50 mg Oral Daily  . metoprolol  50 mg Oral BID  . multivitamin  1 tablet Oral Daily  . sodium chloride  3 mL Intravenous Q12H   EXAM: CVS-Regular S1S2  ? Aortic murmur diastolic RS- lungs are grossly clear ABD-soft NT EXT- bilateral amputee  Lab Results:  Basename 10/11/11 0517 10/10/11 0715 10/09/11 2327 10/09/11 2313  WBC 5.8 6.0 -- 8.8  HGB 11.1* 11.5* 13.6 --  HCT 35.5* 35.5* 40.0 --  PLT 204 207 -- 296   BMET  Basename 10/11/11 0517 10/10/11 0715 10/09/11 2327  NA 132* -- 134*  K 3.8 -- 4.0  CL 92* -- 99  CO2 29 -- --  GLUCOSE 262* -- 234*  BUN 19 -- 58*  CREATININE 5.01* 5.85* 10.70*  CALCIUM 9.5 -- --  PHOS -- -- --   LFT No results found for this basename: PROT,ALBUMIN,AST,ALT,ALKPHOS,BILITOT,BILIDIR,IBILI in the last 72 hours PT/INR No results found for this basename:  LABPROT:2,INR:2 in the last 72 hours Hepatitis Panel No results found for this basename: HEPBSAG,HCVAB,HEPAIGM,HEPBIGM in the last 72 hours PTH: Lab Results  Component Value Date   PTH 74.7* 07/23/2010   CALCIUM 9.5 10/11/2011   CAION 1.07* 10/09/2011   PHOS 2.5 06/21/2011    Studies/Results: Dg Chest Port 1 View  10/10/2011  *RADIOLOGY REPORT*  Clinical Data: CHF, post hemodialysis.  PORTABLE CHEST - 1 VIEW  Comparison:  10/09/2011.  Findings: Cardiomegaly.  Mild central vascular fullness.  Decreased edema pattern. Mild left base atelectasis.  Otherwise, no focal consolidation.  Aortic arch atherosclerosis.  No pneumothorax.  No acute osseous abnormality.  IMPRESSION: Cardiomegaly.  Mild central vascular fullness however decreased edema pattern in the interval.  Original Report Authenticated By: Waneta Martins, M.D.   Dg Chest Port 1 View  10/09/2011  *RADIOLOGY REPORT*  Clinical Data: Shortness of breath  PORTABLE CHEST - 1 VIEW  Comparison: 06/23/2011  Findings: New moderate interstitial and airspace edema bilaterally, with peripheral septal lines evident.  No definite effusion.  Mild cardiomegaly.  Atheromatous aortic arch.  IMPRESSION: Mild cardiomegaly with new bilateral edema.  Original Report Authenticated By: Osa Craver, M.D.   Transthoracic Echocardiography 10/10/11 ------------------------------------------------------------ LV EF: 45% - 50%  ------------------------------------------------------------  History: PMH: Pulmonary edema. Coronary artery disease. Congestive heart failure. Risk factors: CKD. Anemia. PVD. Hypertension. Diabetes mellitus.  ------------------------------------------------------------ ECHOCARDIOGRAM Study Conclusions  - Left ventricle: The cavity size was normal. There was mild concentric hypertrophy. Systolic function was mildly reduced. The estimated ejection fraction was in the range of 45% to 50%. Diffuse hypokinesis. - Aortic valve:  Calcification. Moderate focal calcification involving the left coronary cusp. - Mitral valve: Moderate regurgitation directed posteriorly. - Left atrium: The atrium was mildly dilated. - Right ventricle: Systolic function was mildly reduced. - Tricuspid valve: Moderate regurgitation. - Pulmonary arteries: Systolic pressure was mildly to moderately increased. PA peak pressure: 46mm Hg (S). Transthoracic echocardiography. M-mode, complete 2D, spectral Doppler, and color Doppler. Height: Height: 157.5cm. Height: 62in. Weight: Weight: 60.8kg. Weight: 133.7lb. Body mass index: BMI: 24.5kg/m^2. Body surface area: BSA: 1.18m^2. Blood pressure: 87/46. Patient status: Inpatient. Location: ICU/CCU  Assessment/Plan: 1. Pulm Edema- s/p HD X 2 in  past 24 hours with marked symptom improvement; 2D echo - no AI (although sounds like it on exam) and only mildly reduced LV fx; does have moderate MR as well; still not at EDW of 57.5 2. High BP-restart meds. Dr. Conley Rolls has given labetalol in ER. BP should decrease as fluid is removed; high afterload may have contributed to pulm edema  Not sure why she's on both lisinopril(ACE inhib) AND losartan (A-II blocker)  3. Secondary PTH--continue phos binder, zemplar, sensipar  4. DM--continue insulin  5. 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 ESRD--see above; nex HD on Thursday (usual TTS)      LOS: 2 Dontrelle Mazon B @TODAY @8 :38 AM

## 2011-10-11 NOTE — Progress Notes (Signed)
Patient ID: [name], female   DOB: [birth year], 51 y.o.   MRN: [id number]  Assessment/Plan:  Principal Problem:   *Pulmonary edema with congestive heart failure with reduced left ventricular function - likely secondary to fluid overload - has received 2 HD so far - next HD is for Thursday - renal is following  Active Problems:   ESRD on HD (TTS) - as per renal schedule   HTN (hypertension) - BP 123/64; at goal - we will discontinue lisinopril and will continue norvasc, cozaar and metoprolol   DM (diabetes mellitus) - increase lantus to 27 units at bedtime - continue sliding scale insulin - continue CBG monitoring   Anemia - likely secondary to ESRD - stable hemoglobin - no transfusion warranted at this time   PVD (peripheral vascular disease) - right AKA, Left BKA   EDUCATION - test results and diagnostic studies were discussed with patient at the bedside; patient expressed a concern to me as it bothers her that she continue to present with same symptoms despite being compliant with the HD and medications. She was slightly emotional and I have tried to give her an emotional support and have explained to her that the symptoms are secondary to fluid overload and with dialysis she should improve.  - questions were answered at the bedside and contact information was provided for additional questions or concerns  Subjective: No events overnight. Patient denies chest pain, shortness of breath, abdominal pain.   Objective:  Vital signs in last 24 hours:  Filed Vitals:   10/11/11 0400 10/11/11 0752 10/11/11 1200 10/11/11 1600  BP: 120/64 158/87 153/78 123/64  Pulse:      Temp: 98.3 F (36.8 C) 98.5 F (36.9 C) 98 F (36.7 C) 98 F (36.7 C)  TempSrc: Oral Oral Oral Oral  Resp:      Weight: 60.8 kg (134 lb 0.6 oz)     SpO2:        Intake/Output from previous day:   Intake/Output Summary (Last 24 hours) at 10/11/11 2050 Last data filed at 10/10/11 2200  Gross per 24 hour  Intake    120 ml  Output      0 ml  Net    120 ml    Physical Exam: General: Alert, awake, oriented x3, in no acute distress. HEENT: No bruits, no goiter. Moist mucous membranes, no scleral icterus, no conjunctival pallor. Heart: Regular rate and rhythm, S1/S2 +, diastolic murmur appreciated (3/6) prominent over aortic area, non radiating Lungs: Clear to auscultation bilaterally. No wheezing, no rhonchi, no rales.  Abdomen: Soft, nontender, nondistended, positive bowel sounds. Extremities: right AKA; left BKA Neuro: Grossly nonfocal.  Lab Results:  Basic Metabolic Panel:    Component Value Date/Time   NA 132* 10/11/2011 0517   K 3.8 10/11/2011 0517   CL 92* 10/11/2011 0517   CO2 29 10/11/2011 0517   BUN 19 10/11/2011 0517   CREATININE 5.01* 10/11/2011 0517   GLUCOSE 262* 10/11/2011 0517   CALCIUM 9.5 10/11/2011 0517   CALCIUM 7.5* 07/23/2010 1824   CBC:    Component Value Date/Time   WBC 5.8 10/11/2011 0517   HGB 11.1* 10/11/2011 0517   HCT 35.5* 10/11/2011 0517   PLT 204 10/11/2011 0517   MCV 89.6 10/11/2011 0517   NEUTROABS 5.4 10/09/2011 2313   LYMPHSABS 2.6 10/09/2011 2313   MONOABS 0.4 10/09/2011 2313   EOSABS 0.3 10/09/2011 2313   BASOSABS 0.1 10/09/2011 2313      Lab 10/11/11 0517 10/10/11  0715 10/09/11 2327 10/09/11 2313  WBC 5.8 6.0 -- 8.8  HGB 11.1* 11.5* 13.6 12.5  HCT 35.5* 35.5* 40.0 38.7  PLT 204 207 -- 296  MCV 89.6 86.2 -- 86.4  MCH 28.0 27.9 -- 27.9  MCHC 31.3 32.4 -- 32.3  RDW 14.9 14.6 -- 14.5  LYMPHSABS -- -- -- 2.6  MONOABS -- -- -- 0.4  EOSABS -- -- -- 0.3  BASOSABS -- -- -- 0.1  BANDABS -- -- -- --    Lab 10/11/11 0517 10/10/11 0715 10/09/11 2327  NA 132* -- 134*  K 3.8 -- 4.0  CL 92* -- 99  CO2 29 -- --  GLUCOSE 262* -- 234*  BUN 19 -- 58*  CREATININE 5.01* 5.85* 10.70*  CALCIUM 9.5 -- --  MG -- -- --   No results found for this basename: INR:5,PROTIME:5 in the last 168 hours Cardiac markers:  Lab 10/10/11 1831 10/10/11 1501 10/10/11  0700  CKMB 2.6 3.0 3.4  TROPONINI 0.31* 0.41* 0.34*  MYOGLOBIN -- -- --   No components found with this basename: POCBNP:3 Recent Results (from the past 240 hour(s))  MRSA PCR SCREENING     Status: Normal   Collection Time   10/10/11  5:35 AM      Component Value Range Status Comment   MRSA by PCR NEGATIVE  NEGATIVE  Final     Studies/Results: Dg Chest Port 1 View  10/10/2011  *RADIOLOGY REPORT*  Clinical Data: CHF, post hemodialysis.  PORTABLE CHEST - 1 VIEW  Comparison:  10/09/2011.  Findings: Cardiomegaly.  Mild central vascular fullness.  Decreased edema pattern. Mild left base atelectasis.  Otherwise, no focal consolidation.  Aortic arch atherosclerosis.  No pneumothorax.  No acute osseous abnormality.  IMPRESSION: Cardiomegaly.  Mild central vascular fullness however decreased edema pattern in the interval.  Original Report Authenticated By: Waneta Martins, M.D.   Dg Chest Port 1 View  10/09/2011  *RADIOLOGY REPORT*  Clinical Data: Shortness of breath  PORTABLE CHEST - 1 VIEW  Comparison: 06/23/2011  Findings: New moderate interstitial and airspace edema bilaterally, with peripheral septal lines evident.  No definite effusion.  Mild cardiomegaly.  Atheromatous aortic arch.  IMPRESSION: Mild cardiomegaly with new bilateral edema.  Original Report Authenticated By: Osa Craver, M.D.    Medications: Scheduled Meds:   . amLODipine  10 mg Oral Daily  . calcium acetate (Phos Binder)  2,668 mg Oral TID WC  . cinacalcet  60 mg Oral Q breakfast  . heparin  5,000 Units Subcutaneous Q8H  . insulin aspart  0-9 Units Subcutaneous TID WC  . insulin glargine  27 Units Subcutaneous QHS  . losartan  50 mg Oral Daily  . metoprolol  50 mg Oral BID  . multivitamin  1 tablet Oral Daily  . sodium chloride  3 mL Intravenous Q12H  . DISCONTD: insulin glargine  25 Units Subcutaneous QHS  . DISCONTD: lisinopril  40 mg Oral Daily   Continuous Infusions:  PRN Meds:.sodium chloride, sodium  chloride, acetaminophen, acetaminophen, alteplase, calcium carbonate (dosed in mg elemental calcium), camphor-menthol, docusate sodium, feeding supplement (NEPRO CARB STEADY), feeding supplement (NEPRO CARB STEADY), heparin, heparin, HYDROcodone-homatropine, hydrOXYzine, lidocaine, lidocaine-prilocaine, morphine, ondansetron (ZOFRAN) IV, ondansetron, pentafluoroprop-tetrafluoroeth, sodium chloride, sorbitol, zolpidem DISCONTD: sodium chloride, DISCONTD: ondansetron (ZOFRAN) IV, DISCONTD: ondansetron    LOS: 2 days   Christell Steinmiller 10/11/2011, 8:50 PM  [facility] Pager: [phone]

## 2011-10-12 ENCOUNTER — Inpatient Hospital Stay (HOSPITAL_COMMUNITY): Payer: Medicare Other

## 2011-10-12 LAB — CBC
HCT: 35.3 % — ABNORMAL LOW (ref 36.0–46.0)
MCH: 27.5 pg (ref 26.0–34.0)
MCHC: 31.2 g/dL (ref 30.0–36.0)
RDW: 14.6 % (ref 11.5–15.5)

## 2011-10-12 LAB — BASIC METABOLIC PANEL
BUN: 43 mg/dL — ABNORMAL HIGH (ref 6–23)
Calcium: 9 mg/dL (ref 8.4–10.5)
Creatinine, Ser: 8.55 mg/dL — ABNORMAL HIGH (ref 0.50–1.10)
GFR calc Af Amer: 6 mL/min — ABNORMAL LOW (ref >90)
GFR calc non Af Amer: 5 mL/min — ABNORMAL LOW (ref >90)
Glucose, Bld: 203 mg/dL — ABNORMAL HIGH (ref 70–99)
Potassium: 3.9 mEq/L (ref 3.5–5.1)

## 2011-10-12 MED ORDER — CALCIUM ACETATE (PHOS BINDER) 667 MG/5ML PO SOLN: 2668.0000 mg | Freq: Three times a day (TID) | ORAL | Status: DC

## 2011-10-12 MED ORDER — DOCUSATE SODIUM 283 MG RE ENEM: 1.0000 | ENEMA | RECTAL | Status: AC | PRN

## 2011-10-12 MED ORDER — HYDROCODONE-HOMATROPINE 5-1.5 MG/5ML PO SYRP: 5.0000 mL | ORAL_SOLUTION | ORAL | Status: AC | PRN

## 2011-10-12 MED ORDER — RENA-VITE PO TABS: 1.0000 | ORAL_TABLET | Freq: Every day | ORAL | Status: DC

## 2011-10-12 MED ORDER — CALCIUM CARBONATE 1250 MG/5ML PO SUSP: 500.0000 mg | Freq: Four times a day (QID) | ORAL | Status: DC | PRN

## 2011-10-12 NOTE — Progress Notes (Signed)
Pt given discharge instructions, signed and given forms. All VSS. Pt had HD today and is stable.

## 2011-10-12 NOTE — Discharge Instructions (Signed)
Pulmonary Edema Pulmonary edema (PE) is a condition in which fluid collects in the lungs. This makes it hard to breathe. PE may be a result of the heart not pumping very well, or a result of injury.  CAUSES   Coronary artery disease (blockages in the arteries of the heart) deprives the heart muscle of oxygen, and weakens the muscle. A heart attack is a form of coronary artery disease.   High blood pressure causes the heart muscle to work harder than usual. Over time, the heart muscle may get stiff, and it starts to work less efficiently. It may also fatigue and weaken.   Viral infection of the heart (myocarditis) may weaken the heart muscle.   Metabolic conditions such as thyroid disease, excessive alcohol use, certain vitamin deficiencies, or diabetes, may also weaken the heart muscle.   Leaky or stiff heart valves may impair normal heart function.   Lung disease may strain the heart muscle.   Excessive demands on the heart such as too much salt or fluid intake. Failure to take prescribed medicines.   Lung injury from toxins such as heat or poisonous gas.   Infection in the lungs or other parts of the body.   Fluid overload caused by kidney failure or medications.  SYMPTOMS   Shortness of breath at rest or with exertion.   Grunting, wheezing or gurgling while breathing.   Feeling like you are cannot get enough air.   Breaths are shallow and fast.   A lot of coughing with frothy or bloody mucus.   Skin may become cool, damp and turn a pale or bluish color.  DIAGNOSIS  Initial diagnosis may be based on your history, symptoms, and a physical examination. Additional tests for PE may include:  EKG.   Chest X-ray.   Blood tests.   Stress test.   Ultrasound evaluation of the heart (echocardiogram).   Evaluation by a heart doctor (cardiologist).   Test of the heart arteries to look for blockages (angiogram).   Check of blood oxygen.  TREATMENT  Treatment of PE will  depend on the underlying cause and will focus first on relieving the symptoms.  Extra oxygen to make breathing easier and assist with removing mucus. This may include breathing treatments or a tube into the lungs and a breathing machine.   Medicine to help the body get rid of extra water, usually through an IV.   IV drugs or pills to help the heart pump better.   If poor heart function is the cause, it may include:   Procedures to open blocked arteries, repair damaged heart valves, or remove some of the damaged heart muscle.   A pacemaker to help the heart pump with less effort.  HOME CARE INSTRUCTIONS   Activity Level -- Your caregiver will help you determine what type of exercise program may be helpful. It is important to maintain strength and increase it if possible. Pace your activities to avoid shortness of breath or chest pain. Rest for at least 1 hour before and after meals. Cardiac rehabilitation programs are available in some locations.   Diet -- Eat a heart healthy diet, low in salt, saturated fat, and cholesterol. Ask for help with choices.   Weight Monitoring --Record your hospital or clinic weight. When you get home, compare it to your scale and record your weight. Then, weigh yourself first thing in the morning daily, and record the weights. Tell your caregiver right away if you have gained 3 lb/1.4 kg   in 1 day, 5 lb/2.3 kg in a week, or whatever amount you were told to report. Your medications may need to be adjusted.   Blood pressure monitoring should be done as often as directed. You can get a home blood pressure cuff at your drugstore. Record these values and bring them with you for your clinic visits. Notify your caregiver if you become dizzy or lightheaded upon standing up.   If you are currently a smoker, it is time to quit. Nicotine makes your heart work harder and is one of the leading causes of heart (cardiac) deaths. Do not use nicotine gum or patches before talking to  your doctor.   Make an appointment with your caregiver as directed for follow-up.  SEEK IMMEDIATE MEDICAL CARE IF:   You have severe chest pain. Especially if the pain is crushing or pressure-like and spreads to the arms, back, neck, or jaw, or if you have sweating, are feeling sick to your stomach (nausea), or you are experiencing shortness of breath. THIS IS AN EMERGENCY. DO NOT wait to see if the pain will go away. Get medical help at once. Call for local emergency medical help. DO NOT drive yourself to the hospital.   Your weight increases by 3 lb/1.4 kg in 1 day or 5 lb/2.3 kg in a week.   You notice increasing shortness of breath that is unusual for you. This may happen during rest, sleep or with activity.   You develop chest pain (angina) or pain that is unusual for you.   You develop sweating or nausea that is unusual for you.   You notice more swelling in your hands, feet, ankles or abdomen.   You notice lasting (persistent) dizziness, blurred vision, headache, or unsteadiness.   You begin to cough up bloody mucus (sputum).   You are unable to sleep because it is hard to breathe.   You begin to feel a "jumping" or "fluttering" sensation (palpitations) in the chest that is unusual for you.  IMPORTANT:  Make a list of every medicine, vitamin, or herbal supplement you are taking. Keep the list with you at all times. Show it to your caregiver at every visit and before starting a new medicine. Keep the list up to date.   Ask your caregiver or pharmacist to help you write a plan or schedule so that you know things about each medicine such as:   Why you are taking it.   The possible side effects.   The best time of day to take it.   Foods to take with it or avoid.   When to stop taking it.   Ask your caregiver for a copy of your latest heart tracing (EKG or ECG). Keep a copy with you at all times.  Document Released: 11/11/2002 Document Revised: 05/03/2011 Document  Reviewed: 10/18/2007 [employer] Patient Information 2012 [employer]. 

## 2011-10-12 NOTE — Procedures (Signed)
I attended the patient's dialysis session, reviewed BP's, access information (left AAVG), fluid removal goal.  No changes made. Christelle Igoe B

## 2011-10-12 NOTE — Clinical Documentation Improvement (Signed)
CHF DOCUMENTATION CLARIFICATION QUERY  THIS DOCUMENT IS NOT A PERMANENT PART OF THE MEDICAL RECORD  TO RESPOND TO THE THIS QUERY, FOLLOW THE INSTRUCTIONS BELOW:  1. If needed, update documentation for the patient's encounter via the notes activity.  2. Access this query again and click edit on the In [employer].  3. After updating, or not, click F2 to complete all highlighted (required) fields concerning your review. Select "additional documentation in the medical record" OR "no additional documentation provided".  4. Click Sign note button.  5. The deficiency will fall out of your In Basket *Please let us know if you are not able to complete this workflow by phone or e-mail (listed below).  Please update your documentation within the medical record to reflect your response to this query.                                                                                  10/22/2011  Dear Dr. Mervyn Skeeters. [facility],  In a better effort to capture your patient's severity of illness, reflect appropriate length of stay and utilization of resources, a review of the patient medical record has revealed the following indicators the diagnosis of Heart Failure.    Based on your clinical judgment, please clarify and document in a progress note and/or discharge summary the clinical condition associated with the following supporting information:  In responding to this query please exercise your independent judgment.  The fact that a query is asked, does not imply that any particular answer is desired or expected.  Per documentation "chf" in notes.  Noted Echo done on 10/10/11.  Please clarify the type of CHF and the acuity if known.  Thank you    Possible Clinical Conditions?  Chronic Systolic Congestive Heart Failure Chronic Diastolic Congestive Heart Failure Chronic Systolic & Diastolic Congestive Heart Failure Acute Systolic Congestive Heart Failure Acute Diastolic Congestive Heart  Failure Acute Systolic & Diastolic Congestive Heart Failure Acute on Chronic Systolic Congestive Heart Failure Acute on Chronic Diastolic Congestive Heart Failure Acute on Chronic Systolic & Diastolic  Congestive Heart Failure Other Condition________________________________________ Cannot Clinically Determine    Risk Factors: ESRD, Pulmonary edema, CAD  Signs & Symptoms: presented with SOB, volume overload  Diagnostics: 2 D echo   Treatment: emergent HD  Reviewed: Additional information was provided/ added to D/C summary (Chronic systolic CHF) Thank you very much for your patience, greatly appreciated! Manson Passey [facility] Pager [phone] Phone # [phone]  Thank You,  Lavonda Jumbo  Clinical Documentation Specialist RN, BSN, CDS:  Pager [phone], HIM office [phone]  Health Information Management [place]

## 2011-10-12 NOTE — Discharge Summary (Addendum)
Patient ID: [name] MRN: [id number] DOB/AGE: 1961/02/16 51 y.o.  Admit date: 10/09/2011 Discharge date: 10/12/2011  Primary Care Physician:  Cecille Aver, MD, MD  Assessment/Plan:   Principal Problem:   *Pulmonary edema with congestive heart failure with reduced left ventricular function  - likely secondary to fluid overload  - has received 3 HD to date - patient feels clinically better today with no complaints of shortness of breath  Active Problems:   CHRONIC SYSTOLIC CHF - EF based on 2 D ECHO 45 - 50% - continue home medication regimen with metoprolol and losartan  ESRD on HD (TTS)  - as per renal schedule  - continue schedule as per previous management prior to admission  HTN (hypertension)  - BP at goal  - we will discontinue lisinopril and will continue norvasc, cozaar and metoprolol   DM (diabetes mellitus)  - increased lantus to 27 units at bedtime  - continue sliding scale insulin  - continue CBG monitoring   Anemia  - likely secondary to ESRD  - stable hemoglobin  - no transfusion needed while in hospital  PVD (peripheral vascular disease)  - right AKA, Left BKA   EDUCATION  - test results and diagnostic studies were discussed with patient at the bedside; patient expressed a concern to me as it bothers her that she continue to present with same symptoms despite being compliant with the HD and medications. She was slightly emotional and I have tried to give her an emotional support and have explained to her that the symptoms are secondary to fluid overload and with dialysis she should improve.  - questions were answered at the bedside and contact information was provided for additional questions or concerns   Discharge Diagnoses:    Present on Admission:  .Pulmonary edema with congestive heart failure with reduced left ventricular function .CKD (chronic kidney disease) requiring chronic dialysis .HTN (hypertension) .DM (diabetes  mellitus) .Anemia .PVD (peripheral vascular disease) .CAD in native artery  Principal Problem:  *Pulmonary edema with congestive heart failure with reduced left ventricular function Active Problems:  CKD (chronic kidney disease) requiring chronic dialysis  HTN (hypertension)  DM (diabetes mellitus)  Anemia  PVD (peripheral vascular disease)  CAD in native artery   Medication List  As of 10/12/2011  3:38 PM   STOP taking these medications         lisinopril 40 MG tablet         TAKE these medications         amLODipine 10 MG tablet   Commonly known as: NORVASC   Take 10 mg by mouth daily.      calcium acetate (Phos Binder) 667 MG/5ML Soln   Commonly known as: PHOSLYRA   Take 20 mLs (2,668 mg total) by mouth 3 (three) times daily with meals.      calcium carbonate (dosed in mg elemental calcium) 1250 MG/5ML   Take 5 mLs (500 mg of elemental calcium total) by mouth every 6 (six) hours as needed.      cinacalcet 30 MG tablet   Commonly known as: SENSIPAR   Take 30 mg by mouth daily.      docusate sodium 283 MG enema   Commonly known as: ENEMEEZ   Place 1 enema (283 mg total) rectally as needed.      HYDROcodone-homatropine 5-1.5 MG/5ML syrup   Commonly known as: HYCODAN   Take 5 mLs by mouth every 4 (four) hours as needed for cough.  insulin glargine 100 UNIT/ML injection   Commonly known as: LANTUS   Inject 25 Units into the skin at bedtime.      losartan 50 MG tablet   Commonly known as: COZAAR   Take 50 mg by mouth daily.      metoprolol 50 MG tablet   Commonly known as: LOPRESSOR   Take 50 mg by mouth 2 (two) times daily.      multivitamin Tabs tablet   Take 1 tablet by mouth daily.            Disposition and Follow-up:  - follow up with PCP in 1 week  Consults:   1. nephrology  Significant Diagnostic Studies:  Dg Chest Port 1 View 10/10/2011   IMPRESSION: Cardiomegaly.  Mild central vascular fullness however decreased edema pattern in the  interval.    Dg Chest Port 1 View 10/09/2011  IMPRESSION: Mild cardiomegaly with new bilateral edema.     Brief H and P: 51 y.o. female with history of end-stage renal disease on hemodialysis, hypertension, anemia, diabetes, prior congestive heart failure, peripheral vascular disease, status post bilateral leg amputations, presented to emergency room because of progressively worsening shortness of breath for 1-2 days prior to admission. Shortness of breath was at rest with no aggravating factors and alleviating factors.  Patient reported no associated cough, chest pain or palpitations. No complaints of fever, chills, sweating. No complaints of abdominal pain or nausea or vomiting.  Physical Exam on Discharge:  Filed Vitals:   10/12/11 1100 10/12/11 1130 10/12/11 1200 10/12/11 1214  BP: 106/54 100/48 97/76   Pulse: 82 77 84   Temp:      TempSrc:      Resp:      Weight:    59.5 kg (131 lb 2.8 oz)  SpO2:         Intake/Output Summary (Last 24 hours) at 10/12/11 1538 Last data filed at 10/12/11 1214  Gross per 24 hour  Intake      0 ml  Output   1399 ml  Net  -1399 ml    General: Alert, awake, oriented x3, in no acute distress. HEENT: No bruits, no goiter. Heart: Regular rate and rhythm, without murmurs, rubs, gallops. Lungs: Clear to auscultation bilaterally. Abdomen: Soft, nontender, nondistended, positive bowel sounds. Extremities: right BKA, left AKA Neuro: Grossly intact, nonfocal.  CBC:    Component Value Date/Time   WBC 6.0 10/12/2011 0936   HGB 11.0* 10/12/2011 0936   HCT 35.3* 10/12/2011 0936   PLT 219 10/12/2011 0936   MCV 88.3 10/12/2011 0936   NEUTROABS 5.4 10/09/2011 2313   LYMPHSABS 2.6 10/09/2011 2313   MONOABS 0.4 10/09/2011 2313   EOSABS 0.3 10/09/2011 2313   BASOSABS 0.1 10/09/2011 2313    Basic Metabolic Panel:    Component Value Date/Time   NA 133* 10/12/2011 0936   K 3.9 10/12/2011 0936   CL 91* 10/12/2011 0936   CO2 26 10/12/2011 0936   BUN 43* 10/12/2011 0936    CREATININE 8.55* 10/12/2011 0936   GLUCOSE 203* 10/12/2011 0936   CALCIUM 9.0 10/12/2011 0936   CALCIUM 7.5* 07/23/2010 1824    Time spent on Discharge: Greater than 30 minutes  Signed: Vaniah Chambers 10/12/2011, 3:38 PM

## 2011-10-12 NOTE — Progress Notes (Signed)
Subjective: Interval History: on dialysis at the present time Frustrated because she can't understand why she keeps having the issues with pulm edema and SOB   Objective: Scheduled Meds (reviewed) . amLODipine  10 mg Oral Daily  . calcium acetate (Phos Binder)  2,668 mg Oral TID WC  . cinacalcet  60 mg Oral Q breakfast  . heparin  5,000 Units Subcutaneous Q8H  . insulin aspart  0-9 Units Subcutaneous TID WC  . insulin glargine  27 Units Subcutaneous QHS  . losartan  50 mg Oral Daily  . metoprolol  50 mg Oral BID  . multivitamin  1 tablet Oral Daily  . sodium chloride  3 mL Intravenous Q12H  . DISCONTD: insulin glargine  25 Units Subcutaneous QHS  . DISCONTD: lisinopril  40 mg Oral Daily   Vital signs in last 24 hours:  Temp:  [97.7 F (36.5 C)-98.4 F (36.9 C)] 97.7 F (36.5 C) (02/07 0840) Pulse Rate:  [72-98] 76  (02/07 0930) Resp:  [18] 18  (02/07 0930) BP: (102-153)/(60-79) 104/65 mmHg (02/07 0930) SpO2:  [100 %] 100 % (02/07 0840) Weight:  [61.8 kg (136 lb 3.9 oz)] 61.8 kg (136 lb 3.9 oz) (02/07 0840) Weight change: 2.3 kg (5 lb 1.1 oz)  Intake/Output: I/O last 3 completed shifts: In: 120 [P.O.:120] Out: 1792 [Other:1792]   Physical Exam: CVS-Regular S1S2 ? Aortic murmur diastolic (vs transmitted from access in arm)  RS- lungs are grossly clear  ABD-soft NT  EXT- bilateral amputee Left arm access with needles in place (on dialysis)  Lab Results:  Basename 10/12/11 0936 10/11/11 0517 10/10/11 0715  WBC 6.0 5.8 6.0  HGB 11.0* 11.1* 11.5*  HCT 35.3* 35.5* 35.5*  PLT 219 204 207   BMET  Basename 10/11/11 0517 10/10/11 0715 10/09/11 2327  NA 132* -- 134*  K 3.8 -- 4.0  CL 92* -- 99  CO2 29 -- --  GLUCOSE 262* -- 234*  BUN 19 -- 58*  CREATININE 5.01* 5.85* 10.70*  CALCIUM 9.5 -- --  PHOS -- -- --   Assessment/Plan:  1. Pulm Edema- s/p HD X 2 in past 24 hours with marked symptom improvement; 2D echo - no AI (although sounds like it on exam) and only  mildly reduced LV fx; does have moderate MR as well; still not at EDW of 57.5 and is getting dialysis on her regular schedule at this time Have just learned from her outpt unit that she has been on a unique dialysis prescription recently - that is  On Tuesdays - 60 minutes of sequential to get off 3 liters, then remainder usual HD; on Thursdays and Saturdays - 30 minutes of sequential to get off 1.5 liters then remainder usual TMT.  Have asked her unit to FAX those orders here for review (we have been doing straight dialysis for her TMT's here)  2. High BP - meds -  high afterload on admission (211/128 in the ED)  may have contributed to pulm edema; now off lisinopril; on cozaar, amlodipine and metoprolol   3. Secondary PTH--continue phos binder, zemplar, sensipar  4. DM--continue insulin  5. 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 ESRD- TTS [facility]; on usual schedule; see #1 for current HD Rx 7. Dispo - unless further w/u planned, could prob be d/c;d from a renal standpoint  LOS: 3 Shandricka Monroy B @TODAY @10 :05 AM

## 2011-10-16 ENCOUNTER — Encounter: Payer: Self-pay | Admitting: Internal Medicine

## 2011-12-12 ENCOUNTER — Encounter (HOSPITAL_COMMUNITY): Payer: Self-pay

## 2011-12-12 ENCOUNTER — Other Ambulatory Visit: Payer: Self-pay

## 2011-12-12 ENCOUNTER — Emergency Department (HOSPITAL_COMMUNITY)
Admission: EM | Admit: 2011-12-12 | Discharge: 2011-12-12 | Disposition: A | Discharge status: Home / Self Care | Payer: Medicare Other | Attending: Emergency Medicine | Admitting: Emergency Medicine

## 2011-12-12 DIAGNOSIS — E119 Type 2 diabetes mellitus without complications: Secondary | ICD-10-CM | POA: Insufficient documentation

## 2011-12-12 DIAGNOSIS — I5022 Chronic systolic (congestive) heart failure: Secondary | ICD-10-CM | POA: Insufficient documentation

## 2011-12-12 DIAGNOSIS — Z992 Dependence on renal dialysis: Secondary | ICD-10-CM | POA: Insufficient documentation

## 2011-12-12 DIAGNOSIS — N186 End stage renal disease: Secondary | ICD-10-CM | POA: Insufficient documentation

## 2011-12-12 DIAGNOSIS — I509 Heart failure, unspecified: Secondary | ICD-10-CM | POA: Insufficient documentation

## 2011-12-12 DIAGNOSIS — I739 Peripheral vascular disease, unspecified: Secondary | ICD-10-CM | POA: Insufficient documentation

## 2011-12-12 DIAGNOSIS — R109 Unspecified abdominal pain: Secondary | ICD-10-CM | POA: Insufficient documentation

## 2011-12-12 DIAGNOSIS — Z794 Long term (current) use of insulin: Secondary | ICD-10-CM | POA: Insufficient documentation

## 2011-12-12 DIAGNOSIS — I2510 Atherosclerotic heart disease of native coronary artery without angina pectoris: Secondary | ICD-10-CM | POA: Insufficient documentation

## 2011-12-12 DIAGNOSIS — I120 Hypertensive chronic kidney disease with stage 5 chronic kidney disease or end stage renal disease: Secondary | ICD-10-CM | POA: Insufficient documentation

## 2011-12-12 LAB — POCT I-STAT, CHEM 8
BUN: 70 mg/dL — ABNORMAL HIGH (ref 6–23)
Creatinine, Ser: 11.1 mg/dL — ABNORMAL HIGH (ref 0.50–1.10)
Hemoglobin: 11.9 g/dL — ABNORMAL LOW (ref 12.0–15.0)
Potassium: 4.3 mEq/L (ref 3.5–5.1)
Sodium: 130 mEq/L — ABNORMAL LOW (ref 135–145)

## 2011-12-12 NOTE — ED Notes (Signed)
EMS reports pt from dialysis, full treatment Sat, Full treatment yesterday, 1 hour treatment today, severe abdominal cramping, HTN, 230/120, CBG 310, cramping had reduced to 4/10, was 10/10

## 2011-12-12 NOTE — ED Provider Notes (Signed)
History     CSN: [id number]  Arrival date & time 12/12/11  7829   First MD Initiated Contact with Patient 12/12/11 [phone]      Chief Complaint  Patient presents with  . Abdominal Pain    (Consider location/radiation/quality/duration/timing/severity/associated sxs/prior treatment) HPI CC abd cramping onset during HD today.  Pt states that her nephrologist started using sequential dialysis during the first 2 hours of her session on Tuesdays about one month ago because she was getting abd cramping at the end of her sessions.  She states that today, after the 2 hour sequential during which they pulled off 2L, they started the regular dialysis and this is when the abd cramping started.  It was a 10/10 at first.  En route the pain dissipated to a 4 and is now completely resolved.    Past Medical History  Diagnosis Date  . Hypertension   . Anemia   . Chronic kidney disease   . Systolic CHF, acute on chronic   . Diabetes mellitus   . Coronary artery disease   . Peripheral vascular disease     Past Surgical History  Procedure Date  . Cardiac catheterization   . Abdominal hysterectomy   . Leg amputation below knee   . Above knee leg amputation   . Av fistula placement   . Vascular surgery     History reviewed. No pertinent family history.  History  Substance Use Topics  . Smoking status: Never Smoker   . Smokeless tobacco: Not on file  . Alcohol Use: No    OB History    Grav Para Term Preterm Abortions TAB SAB Ect Mult Living                  Review of Systems  Constitutional: Negative for fever and chills.  Gastrointestinal: Positive for abdominal pain (resolved). Negative for nausea and vomiting.  All other systems reviewed and are negative.    Allergies  Review of patient's allergies indicates no known allergies.  Home Medications   Current Outpatient Rx  Name Route Sig Dispense Refill  . AMLODIPINE BESYLATE 10 MG PO TABS Oral Take 10 mg by mouth daily.    .  ASPIRIN EC 81 MG PO TBEC Oral Take 81 mg by mouth daily.    Marland Kitchen CALCIUM ACETATE (PHOS BINDER) 667 MG/5ML PO SOLN Oral Take 2,001 mg by mouth See admin instructions. 15ml with snacks    . CALCIUM ACETATE (PHOS BINDER) 667 MG/5ML PO SOLN Oral Take 3,335 mg by mouth 3 (three) times daily with meals.    Marland Kitchen CINACALCET HCL 60 MG PO TABS Oral Take 60 mg by mouth daily.    Marland Kitchen GABAPENTIN 100 MG PO CAPS Oral Take 100 mg by mouth at bedtime.    . INSULIN ASPART 100 UNIT/ML [place] SOLN Subcutaneous Inject 1-9 Units into the skin 3 (three) times daily before meals. Sliding scale    . INSULIN GLARGINE 100 UNIT/ML [place] SOLN Subcutaneous Inject 27 Units into the skin at bedtime.     Marland Kitchen LOSARTAN POTASSIUM 50 MG PO TABS Oral Take 50 mg by mouth at bedtime.     Marland Kitchen METOPROLOL TARTRATE 50 MG PO TABS Oral Take 50 mg by mouth 2 (two) times daily.    Marland Kitchen RENA-VITE PO TABS Oral Take 1 tablet by mouth daily. 30 tablet 0  . PANTOPRAZOLE SODIUM 40 MG PO TBEC Oral Take 40 mg by mouth at bedtime as needed. For acid reflux  BP 180/116  Pulse 115  Temp 97.8 F (36.6 C)  Resp 23  SpO2 100%  Physical Exam  Nursing note and vitals reviewed. Constitutional: She appears well-developed and well-nourished.  HENT:  Head: Normocephalic and atraumatic.  Eyes: Right eye exhibits no discharge. Left eye exhibits no discharge.  Neck: Normal range of motion. Neck supple.  Cardiovascular: Normal rate, regular rhythm and normal heart sounds.   Pulmonary/Chest: Effort normal and breath sounds normal.  Abdominal: Soft. There is no tenderness.  Musculoskeletal: She exhibits no edema and no tenderness.  Neurological: She is alert. GCS eye subscore is 4. GCS verbal subscore is 5. GCS motor subscore is 6.  Skin: Skin is warm and dry.  Psychiatric: She has a normal mood and affect. Her behavior is normal.    ED Course  Procedures (including critical care time)  Labs Reviewed  POCT I-STAT, CHEM 8 - Abnormal; Notable for the following:     Sodium 130 (*)    BUN 70 (*)    Creatinine, Ser 11.10 (*)    Glucose, Bld 270 (*)    Calcium, Ion 1.10 (*)    Hemoglobin 11.9 (*)    HCT 35.0 (*)    All other components within normal limits   No results found.   1. Abdominal cramping       Date: 12/12/2011  Rate: 84  Rhythm: normal sinus rhythm  QRS Axis: normal  Intervals: normal  ST/T Wave abnormalities: nonspecific T wave changes  Conduction Disutrbances:none  Narrative Interpretation:   Old EKG Reviewed: unchanged   MDM  Pt is in nad, afvss, nontoxic appearing, exam and hx as above.  abd is benign, pain completely resolved pta.  Will check electrolytes.   Chem-8 without any significant changes from pt's baseline, potassium wnl, will d/c      Elijio Miles, MD 12/12/11 1610  Elijio Miles, MD 12/12/11 [phone]

## 2011-12-12 NOTE — Discharge Instructions (Signed)
Abdominal Pain Abdominal pain can be caused by many things. Your caregiver decides the seriousness of your pain by an examination and possibly blood tests and X-rays. Many cases can be observed and treated at home. Most abdominal pain is not caused by a disease and will probably improve without treatment. However, in many cases, more time must pass before a clear cause of the pain can be found. Before that point, it may not be known if you need more testing, or if hospitalization or surgery is needed. HOME CARE INSTRUCTIONS   Do not take laxatives unless directed by your caregiver.   Take pain medicine only as directed by your caregiver.   Only take over-the-counter or prescription medicines for pain, discomfort, or fever as directed by your caregiver.   Try a clear liquid diet (broth, tea, or water) for as long as directed by your caregiver. Slowly move to a bland diet as tolerated.  SEEK IMMEDIATE MEDICAL CARE IF:   The pain does not go away.   You have a fever.   You keep throwing up (vomiting).   The pain is felt only in portions of the abdomen. Pain in the right side could possibly be appendicitis. In an adult, pain in the left lower portion of the abdomen could be colitis or diverticulitis.   You pass bloody or black tarry stools.  MAKE SURE YOU:   Understand these instructions.   Will watch your condition.   Will get help right away if you are not doing well or get worse.  Document Released: 05/31/2005 Document Revised: 08/10/2011 Document Reviewed: 04/08/2008 [facility] Patient Information 2012 [place], [place].  Return for any new or worsening symptoms or any other concerns.

## 2011-12-14 NOTE — ED Provider Notes (Signed)
I saw and evaluated the patient, reviewed the resident's note and I agree with the findings and plan. Pt w abd cramping during hd today. No abd pain currently. abd soft nt.   Suzi Roots, MD 12/14/11 [phone]

## 2012-01-01 ENCOUNTER — Other Ambulatory Visit: Payer: Self-pay

## 2012-01-01 ENCOUNTER — Encounter (HOSPITAL_COMMUNITY): Payer: Self-pay | Admitting: Emergency Medicine

## 2012-01-01 ENCOUNTER — Inpatient Hospital Stay (HOSPITAL_COMMUNITY)
Admission: EM | Admit: 2012-01-01 | Discharge: 2012-01-03 | DRG: 682 | Disposition: A | Discharge status: Home / Self Care | Payer: Medicare Other | Attending: Internal Medicine | Admitting: Internal Medicine

## 2012-01-01 DIAGNOSIS — N2581 Secondary hyperparathyroidism of renal origin: Secondary | ICD-10-CM | POA: Diagnosis present

## 2012-01-01 DIAGNOSIS — N186 End stage renal disease: Secondary | ICD-10-CM | POA: Diagnosis present

## 2012-01-01 DIAGNOSIS — E119 Type 2 diabetes mellitus without complications: Secondary | ICD-10-CM | POA: Diagnosis present

## 2012-01-01 DIAGNOSIS — R0789 Other chest pain: Secondary | ICD-10-CM | POA: Diagnosis present

## 2012-01-01 DIAGNOSIS — Z9119 Patient's noncompliance with other medical treatment and regimen: Secondary | ICD-10-CM

## 2012-01-01 DIAGNOSIS — I509 Heart failure, unspecified: Secondary | ICD-10-CM | POA: Diagnosis present

## 2012-01-01 DIAGNOSIS — I739 Peripheral vascular disease, unspecified: Secondary | ICD-10-CM | POA: Diagnosis present

## 2012-01-01 DIAGNOSIS — J9601 Acute respiratory failure with hypoxia: Secondary | ICD-10-CM | POA: Diagnosis present

## 2012-01-01 DIAGNOSIS — I120 Hypertensive chronic kidney disease with stage 5 chronic kidney disease or end stage renal disease: Principal | ICD-10-CM | POA: Diagnosis present

## 2012-01-01 DIAGNOSIS — S78119A Complete traumatic amputation at level between unspecified hip and knee, initial encounter: Secondary | ICD-10-CM

## 2012-01-01 DIAGNOSIS — Z992 Dependence on renal dialysis: Secondary | ICD-10-CM

## 2012-01-01 DIAGNOSIS — J810 Acute pulmonary edema: Secondary | ICD-10-CM | POA: Diagnosis present

## 2012-01-01 DIAGNOSIS — I10 Essential (primary) hypertension: Secondary | ICD-10-CM | POA: Diagnosis present

## 2012-01-01 DIAGNOSIS — D649 Anemia, unspecified: Secondary | ICD-10-CM | POA: Diagnosis present

## 2012-01-01 DIAGNOSIS — S88119A Complete traumatic amputation at level between knee and ankle, unspecified lower leg, initial encounter: Secondary | ICD-10-CM

## 2012-01-01 DIAGNOSIS — I2510 Atherosclerotic heart disease of native coronary artery without angina pectoris: Secondary | ICD-10-CM | POA: Diagnosis present

## 2012-01-01 DIAGNOSIS — I5022 Chronic systolic (congestive) heart failure: Secondary | ICD-10-CM | POA: Diagnosis present

## 2012-01-01 DIAGNOSIS — I501 Left ventricular failure: Secondary | ICD-10-CM | POA: Diagnosis present

## 2012-01-01 DIAGNOSIS — Z91199 Patient's noncompliance with other medical treatment and regimen due to unspecified reason: Secondary | ICD-10-CM

## 2012-01-01 HISTORY — DX: Shortness of breath: R06.02

## 2012-01-01 HISTORY — DX: Angina pectoris, unspecified: I20.9

## 2012-01-01 HISTORY — DX: Reserved for inherently not codable concepts without codable children: IMO0001

## 2012-01-01 HISTORY — DX: Type 2 diabetes mellitus without complications: E11.9

## 2012-01-01 HISTORY — DX: Encounter for other specified aftercare: Z51.89

## 2012-01-01 NOTE — ED Notes (Signed)
SOB starting tonight; reports got really hot. Not sweaty. Diaylsis pt.

## 2012-01-02 ENCOUNTER — Encounter (HOSPITAL_COMMUNITY): Payer: Self-pay | Admitting: Internal Medicine

## 2012-01-02 ENCOUNTER — Inpatient Hospital Stay (HOSPITAL_COMMUNITY): Payer: Medicare Other

## 2012-01-02 ENCOUNTER — Emergency Department (HOSPITAL_COMMUNITY): Payer: Medicare Other

## 2012-01-02 DIAGNOSIS — J9601 Acute respiratory failure with hypoxia: Secondary | ICD-10-CM | POA: Diagnosis present

## 2012-01-02 DIAGNOSIS — R0602 Shortness of breath: Secondary | ICD-10-CM

## 2012-01-02 DIAGNOSIS — Z992 Dependence on renal dialysis: Secondary | ICD-10-CM | POA: Diagnosis present

## 2012-01-02 DIAGNOSIS — N186 End stage renal disease: Secondary | ICD-10-CM | POA: Diagnosis present

## 2012-01-02 DIAGNOSIS — J810 Acute pulmonary edema: Secondary | ICD-10-CM | POA: Diagnosis present

## 2012-01-02 HISTORY — DX: Shortness of breath: R06.02

## 2012-01-02 LAB — D-DIMER, QUANTITATIVE: D-Dimer, Quant: 1.48 ug/mL-FEU — ABNORMAL HIGH (ref 0.00–0.48)

## 2012-01-02 LAB — POCT I-STAT TROPONIN I: Troponin i, poc: 0.14 ng/mL (ref 0.00–0.08)

## 2012-01-02 LAB — CARDIAC PANEL(CRET KIN+CKTOT+MB+TROPI)
CK, MB: 4.2 ng/mL — ABNORMAL HIGH (ref 0.3–4.0)
CK, MB: 5.1 ng/mL — ABNORMAL HIGH (ref 0.3–4.0)
Relative Index: INVALID (ref 0.0–2.5)
Relative Index: 4.3 — ABNORMAL HIGH (ref 0.0–2.5)
Total CK: 96 U/L (ref 7–177)
Total CK: 103 U/L (ref 7–177)
Troponin I: <0.3 ng/mL (ref <0.30)
Troponin I: 0.78 ng/mL (ref <0.30)
Troponin I: 0.4 ng/mL (ref <0.30)

## 2012-01-02 LAB — CBC
HCT: 33.5 % — ABNORMAL LOW (ref 36.0–46.0)
MCH: 28 pg (ref 26.0–34.0)
MCHC: 32.8 g/dL (ref 30.0–36.0)
MCV: 84.5 fL (ref 78.0–100.0)
Platelets: 249 10*3/uL (ref 150–400)
RBC: 3.54 MIL/uL — ABNORMAL LOW (ref 3.87–5.11)
RDW: 15.2 % (ref 11.5–15.5)
WBC: 7.8 10*3/uL (ref 4.0–10.5)

## 2012-01-02 LAB — POCT I-STAT, CHEM 8
Calcium, Ion: 1.08 mmol/L — ABNORMAL LOW (ref 1.12–1.32)
HCT: 35 % — ABNORMAL LOW (ref 36.0–46.0)
Hemoglobin: 11.9 g/dL — ABNORMAL LOW (ref 12.0–15.0)
Sodium: 133 mEq/L — ABNORMAL LOW (ref 135–145)
TCO2: 26 mmol/L (ref 0–100)

## 2012-01-02 LAB — COMPREHENSIVE METABOLIC PANEL
Albumin: 3.4 g/dL — ABNORMAL LOW (ref 3.5–5.2)
BUN: 65 mg/dL — ABNORMAL HIGH (ref 6–23)
Chloride: 91 mEq/L — ABNORMAL LOW (ref 96–112)
Creatinine, Ser: 12.42 mg/dL — ABNORMAL HIGH (ref 0.50–1.10)
Total Bilirubin: 0.2 mg/dL — ABNORMAL LOW (ref 0.3–1.2)
Total Protein: 7.2 g/dL (ref 6.0–8.3)

## 2012-01-02 LAB — GLUCOSE, CAPILLARY
Glucose-Capillary: 136 mg/dL — ABNORMAL HIGH (ref 70–99)
Glucose-Capillary: 181 mg/dL — ABNORMAL HIGH (ref 70–99)

## 2012-01-02 LAB — PRO B NATRIURETIC PEPTIDE: Pro B Natriuretic peptide (BNP): 16007 pg/mL — ABNORMAL HIGH (ref 0–125)

## 2012-01-02 MED ORDER — METOPROLOL TARTRATE 50 MG PO TABS
50.0000 mg | ORAL_TABLET | Freq: Two times a day (BID) | ORAL | Status: DC
  Administered 2012-01-02 – 2012-01-03 (×2): 50 mg via ORAL
  Filled 2012-01-02 (×5): qty 1

## 2012-01-02 MED ORDER — ASPIRIN EC 325 MG PO TBEC
325.0000 mg | DELAYED_RELEASE_TABLET | Freq: Every day | ORAL | Status: DC
  Filled 2012-01-02: qty 1

## 2012-01-02 MED ORDER — CALCIUM ACETATE (PHOS BINDER) 667 MG/5ML PO SOLN
2001.0000 mg | ORAL | Status: DC | PRN
  Filled 2012-01-02 (×5): qty 15

## 2012-01-02 MED ORDER — RENA-VITE PO TABS
1.0000 | ORAL_TABLET | Freq: Every day | ORAL | Status: DC
  Filled 2012-01-02: qty 1

## 2012-01-02 MED ORDER — DARBEPOETIN ALFA-POLYSORBATE 60 MCG/0.3ML IJ SOLN
INTRAMUSCULAR | Status: AC
  Filled 2012-01-02: qty 0.3

## 2012-01-02 MED ORDER — GABAPENTIN 100 MG PO CAPS
100.0000 mg | ORAL_CAPSULE | Freq: Every day | ORAL | Status: DC
  Administered 2012-01-02: 100 mg via ORAL
  Filled 2012-01-02 (×2): qty 1

## 2012-01-02 MED ORDER — CINACALCET HCL 30 MG PO TABS
60.0000 mg | ORAL_TABLET | Freq: Every day | ORAL | Status: DC
  Administered 2012-01-02 – 2012-01-03 (×2): 60 mg via ORAL
  Filled 2012-01-02 (×4): qty 2

## 2012-01-02 MED ORDER — ACETAMINOPHEN 650 MG RE SUPP: 650.0000 mg | Freq: Four times a day (QID) | RECTAL | Status: DC | PRN

## 2012-01-02 MED ORDER — INSULIN ASPART 100 UNIT/ML ~~LOC~~ SOLN
0.0000 [IU] | Freq: Three times a day (TID) | SUBCUTANEOUS | Status: DC
  Administered 2012-01-02: 1 [IU] via SUBCUTANEOUS
  Administered 2012-01-02: 2 [IU] via SUBCUTANEOUS
  Administered 2012-01-03: 3 [IU] via SUBCUTANEOUS
  Administered 2012-01-03: 2 [IU] via SUBCUTANEOUS

## 2012-01-02 MED ORDER — CALCIUM ACETATE (PHOS BINDER) 667 MG/5ML PO SOLN
3335.0000 mg | Freq: Three times a day (TID) | ORAL | Status: DC
Start: 2012-01-02 — End: 2012-01-03
  Administered 2012-01-02 – 2012-01-03 (×2): 3335 mg via ORAL
  Filled 2012-01-02 (×7): qty 25

## 2012-01-02 MED ORDER — HEPARIN SODIUM (PORCINE) 5000 UNIT/ML IJ SOLN
5000.0000 [IU] | Freq: Three times a day (TID) | INTRAMUSCULAR | Status: DC
  Administered 2012-01-02 – 2012-01-03 (×4): 5000 [IU] via SUBCUTANEOUS
  Filled 2012-01-02 (×7): qty 1

## 2012-01-02 MED ORDER — INSULIN GLARGINE 100 UNIT/ML ~~LOC~~ SOLN
27.0000 [IU] | Freq: Every day | SUBCUTANEOUS | Status: DC
  Administered 2012-01-02: 27 [IU] via SUBCUTANEOUS

## 2012-01-02 MED ORDER — ACETAMINOPHEN 325 MG PO TABS
650.0000 mg | ORAL_TABLET | Freq: Four times a day (QID) | ORAL | Status: DC | PRN
  Administered 2012-01-02: 650 mg via ORAL
  Filled 2012-01-02: qty 2

## 2012-01-02 MED ORDER — PANTOPRAZOLE SODIUM 40 MG PO TBEC
40.0000 mg | DELAYED_RELEASE_TABLET | Freq: Every day | ORAL | Status: DC
  Administered 2012-01-02 – 2012-01-03 (×2): 40 mg via ORAL
  Filled 2012-01-02 (×2): qty 1

## 2012-01-02 MED ORDER — LOSARTAN POTASSIUM 50 MG PO TABS
100.0000 mg | ORAL_TABLET | Freq: Every day | ORAL | Status: DC
  Administered 2012-01-02: 100 mg via ORAL
  Filled 2012-01-02 (×2): qty 2

## 2012-01-02 MED ORDER — SODIUM CHLORIDE 0.9 % IJ SOLN
3.0000 mL | Freq: Two times a day (BID) | INTRAMUSCULAR | Status: DC
  Administered 2012-01-02: 3 mL via INTRAVENOUS

## 2012-01-02 MED ORDER — CLONIDINE HCL 0.1 MG PO TABS
0.1000 mg | ORAL_TABLET | ORAL | Status: DC | PRN
  Filled 2012-01-02: qty 1

## 2012-01-02 MED ORDER — NITROGLYCERIN IN D5W 200-5 MCG/ML-% IV SOLN
2.0000 ug/min | Freq: Once | INTRAVENOUS | Status: DC
  Filled 2012-01-02: qty 250

## 2012-01-02 MED ORDER — ASPIRIN EC 81 MG PO TBEC
81.0000 mg | DELAYED_RELEASE_TABLET | Freq: Every day | ORAL | Status: DC
  Administered 2012-01-02 – 2012-01-03 (×2): 81 mg via ORAL
  Filled 2012-01-02 (×2): qty 1

## 2012-01-02 MED ORDER — ONDANSETRON HCL 4 MG/2ML IJ SOLN
4.0000 mg | Freq: Four times a day (QID) | INTRAMUSCULAR | Status: DC | PRN
  Administered 2012-01-02: 4 mg via INTRAVENOUS
  Filled 2012-01-02 (×2): qty 2

## 2012-01-02 MED ORDER — AMLODIPINE BESYLATE 10 MG PO TABS
10.0000 mg | ORAL_TABLET | Freq: Every day | ORAL | Status: DC
  Administered 2012-01-02 – 2012-01-03 (×2): 10 mg via ORAL
  Filled 2012-01-02 (×2): qty 1

## 2012-01-02 MED ORDER — SODIUM CHLORIDE 0.9 % IJ SOLN
3.0000 mL | Freq: Two times a day (BID) | INTRAMUSCULAR | Status: DC
  Administered 2012-01-02 – 2012-01-03 (×2): 3 mL via INTRAVENOUS

## 2012-01-02 MED ORDER — RENA-VITE PO TABS
1.0000 | ORAL_TABLET | Freq: Every day | ORAL | Status: DC
  Administered 2012-01-02: 1 via ORAL
  Filled 2012-01-02 (×2): qty 1

## 2012-01-02 MED ORDER — NITROGLYCERIN IN D5W 200-5 MCG/ML-% IV SOLN
5.0000 ug/min | INTRAVENOUS | Status: DC
  Administered 2012-01-02: 5 ug/min via INTRAVENOUS

## 2012-01-02 MED ORDER — ASPIRIN 81 MG PO CHEW
324.0000 mg | CHEWABLE_TABLET | Freq: Once | ORAL | Status: AC
  Administered 2012-01-02: 324 mg via ORAL
  Filled 2012-01-02: qty 4

## 2012-01-02 MED ORDER — LOSARTAN POTASSIUM 50 MG PO TABS
50.0000 mg | ORAL_TABLET | Freq: Every day | ORAL | Status: DC
  Filled 2012-01-02: qty 1

## 2012-01-02 MED ORDER — DARBEPOETIN ALFA-POLYSORBATE 60 MCG/0.3ML IJ SOLN
60.0000 ug | INTRAMUSCULAR | Status: DC
  Administered 2012-01-02: 60 ug via INTRAVENOUS
  Filled 2012-01-02: qty 0.3

## 2012-01-02 MED ORDER — ONDANSETRON HCL 4 MG PO TABS: 4.0000 mg | ORAL_TABLET | Freq: Four times a day (QID) | ORAL | Status: DC | PRN

## 2012-01-02 NOTE — Procedures (Signed)
I was present at this session.  I have reviewed the session itself and made appropriate changes.  Jeanae Whitmill K. 4/30/20139:47 AM

## 2012-01-02 NOTE — H&P (Signed)
[name] is an 51 y.o. female.   PCP/Nephrologist - [facility]. Chief Complaint: Shortness of breath. HPI: 51 year-old female with known history of ESRD on hemodialysis on Tuesdays Thursdays and Saturday, LV dysfunction last EF measured was 45-50%, diabetes mellitus2, hypertension presented to the ER with sudden onset of shortness of breath which started off last night at around 10 PM. Patient states she did not miss her scheduled dialysis. Initially patient had some chest pressure which has resolved. In the ER patient was found to be hypertensive and chest x-ray was showing features of acute pulmonary edema. ER physician Dr. Dierdre Highman had discussed with Dr. Briant Cedar nephrologist On-call. Dr. Briant Cedar is arranging for dialysis and at this time patient will be admitted under hospitalist service. Patient denies any nausea vomiting, abdominal pain, headache, cough fever chills.  Past Medical History  Diagnosis Date  . Hypertension   . Anemia   . Chronic kidney disease   . Systolic CHF, acute on chronic   . Diabetes mellitus   . Coronary artery disease   . Peripheral vascular disease     Past Surgical History  Procedure Date  . Cardiac catheterization   . Abdominal hysterectomy   . Leg amputation below knee   . Above knee leg amputation   . Av fistula placement   . Vascular surgery     History reviewed. No pertinent family history. Social History:  reports that she has never smoked. She does not have any smokeless tobacco history on file. She reports that she does not drink alcohol or use illicit drugs.  Allergies: No Known Allergies   (Not in a hospital admission)  Results for orders placed during the hospital encounter of 01/01/12 (from the past 48 hour(s))  D-DIMER, QUANTITATIVE     Status: Abnormal   Collection Time   01/02/12  2:20 AM      Component Value Range Comment   D-Dimer, Quant 1.48 (*) 0.00 - 0.48 (ug/mL-FEU)   PRO B NATRIURETIC PEPTIDE      Status: Abnormal   Collection Time   01/02/12  2:21 AM      Component Value Range Comment   Pro B Natriuretic peptide (BNP) 16007.0 (*) 0 - 125 (pg/mL)   CARDIAC PANEL(CRET KIN+CKTOT+MB+TROPI)     Status: Normal   Collection Time   01/02/12  2:21 AM      Component Value Range Comment   Total CK 96  7 - 177 (U/L)    CK, MB 3.6  0.3 - 4.0 (ng/mL)    Troponin I <0.30  <0.30 (ng/mL)    Relative Index RELATIVE INDEX IS INVALID  0.0 - 2.5    CBC     Status: Abnormal   Collection Time   01/02/12  2:21 AM      Component Value Range Comment   WBC 9.7  4.0 - 10.5 (K/uL)    RBC 3.93  3.87 - 5.11 (MIL/uL)    Hemoglobin 11.0 (*) 12.0 - 15.0 (g/dL)    HCT 16.1 (*) 09.6 - 46.0 (%)    MCV 85.2  78.0 - 100.0 (fL)    MCH 28.0  26.0 - 34.0 (pg)    MCHC 32.8  30.0 - 36.0 (g/dL)    RDW 04.5  40.9 - 81.1 (%)    Platelets 292  150 - 400 (K/uL)   POCT I-STAT TROPONIN I     Status: Abnormal   Collection Time   01/02/12  2:33 AM  Component Value Range Comment   Troponin i, poc 0.14 (*) 0.00 - 0.08 (ng/mL)    Comment NOTIFIED PHYSICIAN      Comment 3            POCT I-STAT, CHEM 8     Status: Abnormal   Collection Time   01/02/12  2:35 AM      Component Value Range Comment   Sodium 133 (*) 135 - 145 (mEq/L)    Potassium 4.9  3.5 - 5.1 (mEq/L)    Chloride 102  96 - 112 (mEq/L)    BUN 81 (*) 6 - 23 (mg/dL)    Creatinine, Ser 69.62 (*) 0.50 - 1.10 (mg/dL)    Glucose, Bld 952 (*) 70 - 99 (mg/dL)    Calcium, Ion 8.41 (*) 1.12 - 1.32 (mmol/L)    TCO2 26  0 - 100 (mmol/L)    Hemoglobin 11.9 (*) 12.0 - 15.0 (g/dL)    HCT 32.4 (*) 40.1 - 46.0 (%)    Dg Chest Portable 1 View  01/02/2012  *RADIOLOGY REPORT*  Clinical Data: Shortness of breath  PORTABLE CHEST - 1 VIEW  Comparison: 10/10/2011; 10/09/2011; 06/22/2011  Findings:  Unchanged enlarged cardiac silhouette and mediastinal contours with atherosclerotic calcification of the aortic arch.  The pulmonary vasculature is indistinct with cephalization of  flow.  Interval development of perihilar heterogeneous opacities.  No definite pleural effusion or pneumothorax.  Unchanged bones.  Possible dermal calcifications overlying the inferior outer quadrant of the left breast, similar to remote examination performed 10/20 12.  IMPRESSION: Overall findings most suggestive of pulmonary edema.  Original Report Authenticated By: Waynard Reeds, M.D.    Review of Systems  Constitutional: Negative.   HENT: Negative.   Eyes: Negative.   Respiratory: Positive for shortness of breath.   Cardiovascular: Positive for chest pain.  Gastrointestinal: Negative.   Genitourinary: Negative.   Musculoskeletal: Negative.   Skin: Negative.   Neurological: Negative.   Endo/Heme/Allergies: Negative.   Psychiatric/Behavioral: Negative.     Blood pressure 196/101, pulse 99, temperature 98.1 F (36.7 C), temperature source Oral, resp. rate 19, SpO2 99.00%. Physical Exam  Constitutional: She is oriented to person, place, and time. She appears well-developed and well-nourished. No distress.  HENT:  Head: Normocephalic and atraumatic.  Right Ear: External ear normal.  Left Ear: External ear normal.  Nose: Nose normal.  Mouth/Throat: Oropharynx is clear and moist. No oropharyngeal exudate.  Eyes: Conjunctivae are normal. Pupils are equal, round, and reactive to light. Right eye exhibits no discharge. Left eye exhibits no discharge. No scleral icterus.  Neck: Normal range of motion. Neck supple.  Cardiovascular: Normal rate and regular rhythm.   Respiratory: Effort normal and breath sounds normal. No respiratory distress. She has no wheezes. She has no rales.  GI: Soft. Bowel sounds are normal. She exhibits no distension. There is no tenderness.  Musculoskeletal: Normal range of motion.       Right AKA and Left BKA.  Neurological: She is alert and oriented to person, place, and time.       Moves all extremities.  Skin: Skin is warm and dry. She is not diaphoretic.  No erythema.  Psychiatric: Her behavior is normal.     Assessment/Plan #1. Acute pulmonary edema in a dialysis patient - most likely secondary to fluid overload. Dr. Briant Cedar is arranging for dialysis. I think patient's symptoms will improve after dialysis. Patient recently some nitroglycerin infusion which we will continue until dialysis. #2. Accelerated hypertension -  continue home medications. Patient is on nitroglycerin infusion. I think with dialysis patient's blood pressure will improve and nitroglycerin can be discontinued. #3. Chest discomfort - initially patient is subject discomfort but presently is chest pain-free. This may be from the high blood pressure. We will cycle cardiac markers. #4. History of LV dysfunction last EF measured was 45-50% - continue home medications. #5. Diabetes mellitus2  - continue home medications with sliding scale coverage. #6. Peripheral vascular disease status post right AKA and left BKA.  CODE STATUS - full code.  Ison Wichmann N. 01/02/2012, 5:17 AM

## 2012-01-02 NOTE — ED Provider Notes (Signed)
History     CSN: [id number]  Arrival date & time 01/01/12  2324   First MD Initiated Contact with Patient 01/02/12 0129      Chief Complaint  Patient presents with  . Shortness of Breath    (Consider location/radiation/quality/duration/timing/severity/associated sxs/prior treatment) HPI History provided by patient. Increasing shortness of breath tonight with history of end-stage renal disease and heart failure. Patient is diabetic with bilateral below the knee amputations and wheelchair-bound. She had her routine dialysis 2 days ago on Saturday and is scheduled for dialysis this morning. No chest pain. No fevers. Symptoms constant and progressively worsening. Moderate to severe. No known alleviating factors. Worse with any kind of exertion Past Medical History  Diagnosis Date  . Hypertension   . Anemia   . Chronic kidney disease   . Systolic CHF, acute on chronic   . Diabetes mellitus   . Coronary artery disease   . Peripheral vascular disease     Past Surgical History  Procedure Date  . Cardiac catheterization   . Abdominal hysterectomy   . Leg amputation below knee   . Above knee leg amputation   . Av fistula placement   . Vascular surgery     No family history on file.  History  Substance Use Topics  . Smoking status: Never Smoker   . Smokeless tobacco: Not on file  . Alcohol Use: No    OB History    Grav Para Term Preterm Abortions TAB SAB Ect Mult Living                  Review of Systems  Constitutional: Negative for fever and chills.  HENT: Negative for neck pain and neck stiffness.   Eyes: Negative for pain.  Respiratory: Positive for shortness of breath. Negative for cough.   Cardiovascular: Negative for chest pain.  Gastrointestinal: Negative for abdominal pain.  Genitourinary: Negative for dysuria.  Musculoskeletal: Negative for back pain.  Skin: Negative for rash.  Neurological: Negative for headaches.  All other systems reviewed and are  negative.    Allergies  Review of patient's allergies indicates no known allergies.  Home Medications   Current Outpatient Rx  Name Route Sig Dispense Refill  . AMLODIPINE BESYLATE 10 MG PO TABS Oral Take 10 mg by mouth daily.    . ASPIRIN EC 81 MG PO TBEC Oral Take 81 mg by mouth daily.    Marland Kitchen CALCIUM ACETATE (PHOS BINDER) 667 MG/5ML PO SOLN Oral Take 2,001 mg by mouth See admin instructions. 15ml with snacks    . CALCIUM ACETATE (PHOS BINDER) 667 MG/5ML PO SOLN Oral Take 3,335 mg by mouth 3 (three) times daily with meals.    Marland Kitchen CINACALCET HCL 60 MG PO TABS Oral Take 60 mg by mouth daily.    Marland Kitchen GABAPENTIN 100 MG PO CAPS Oral Take 100 mg by mouth at bedtime.    . INSULIN ASPART 100 UNIT/ML [place] SOLN Subcutaneous Inject 1-9 Units into the skin 3 (three) times daily before meals. Sliding scale    . INSULIN GLARGINE 100 UNIT/ML [place] SOLN Subcutaneous Inject 27 Units into the skin at bedtime.     Marland Kitchen LOSARTAN POTASSIUM 50 MG PO TABS Oral Take 50 mg by mouth at bedtime.     Marland Kitchen METOPROLOL TARTRATE 50 MG PO TABS Oral Take 50 mg by mouth 2 (two) times daily.    Marland Kitchen RENA-VITE PO TABS Oral Take 1 tablet by mouth daily. 30 tablet 0  . PANTOPRAZOLE SODIUM 40  MG PO TBEC Oral Take 40 mg by mouth at bedtime as needed. For acid reflux      BP 200/101  Pulse 111  Temp(Src) 98.1 F (36.7 C) (Oral)  Resp 20  SpO2 95%  Physical Exam  Constitutional: She is oriented to person, place, and time. She appears well-developed and well-nourished.  HENT:  Head: Normocephalic and atraumatic.  Eyes: Conjunctivae and EOM are normal. Pupils are equal, round, and reactive to light.  Neck: Trachea normal. Neck supple. No thyromegaly present.  Cardiovascular: Normal rate, regular rhythm, S1 normal, S2 normal and normal pulses.     No systolic murmur is present   No diastolic murmur is present  Pulses:      Radial pulses are 2+ on the right side, and 2+ on the left side.  Pulmonary/Chest: She has no rhonchi.        Moderate tachypnea with bilateral rails and mild respiratory distress  Abdominal: Soft. Normal appearance and bowel sounds are normal. There is no tenderness. There is no rebound, no guarding, no CVA tenderness and negative Murphy's sign.  Musculoskeletal:       Left upper extremity dialysis access site with palpable thrill and distal neurovascular intact  Neurological: She is alert and oriented to person, place, and time. She has normal strength. No cranial nerve deficit or sensory deficit. GCS eye subscore is 4. GCS verbal subscore is 5. GCS motor subscore is 6.  Skin: Skin is warm and dry. No rash noted. She is not diaphoretic.  Psychiatric: Her speech is normal.       Cooperative and appropriate    ED Course  Procedures (including critical care time)  Results for orders placed during the hospital encounter of 01/01/12  D-DIMER, QUANTITATIVE      Component Value Range   D-Dimer, Quant 1.48 (*) 0.00 - 0.48 (ug/mL-FEU)  PRO B NATRIURETIC PEPTIDE      Component Value Range   Pro B Natriuretic peptide (BNP) 16007.0 (*) 0 - 125 (pg/mL)  CARDIAC PANEL(CRET KIN+CKTOT+MB+TROPI)      Component Value Range   Total CK 96  7 - 177 (U/L)   CK, MB 3.6  0.3 - 4.0 (ng/mL)   Troponin I <0.30  <0.30 (ng/mL)   Relative Index RELATIVE INDEX IS INVALID  0.0 - 2.5   CBC      Component Value Range   WBC 9.7  4.0 - 10.5 (K/uL)   RBC 3.93  3.87 - 5.11 (MIL/uL)   Hemoglobin 11.0 (*) 12.0 - 15.0 (g/dL)   HCT 96.0 (*) 45.4 - 46.0 (%)   MCV 85.2  78.0 - 100.0 (fL)   MCH 28.0  26.0 - 34.0 (pg)   MCHC 32.8  30.0 - 36.0 (g/dL)   RDW 09.8  11.9 - 14.7 (%)   Platelets 292  150 - 400 (K/uL)  POCT I-STAT, CHEM 8      Component Value Range   Sodium 133 (*) 135 - 145 (mEq/L)   Potassium 4.9  3.5 - 5.1 (mEq/L)   Chloride 102  96 - 112 (mEq/L)   BUN 81 (*) 6 - 23 (mg/dL)   Creatinine, Ser 82.95 (*) 0.50 - 1.10 (mg/dL)   Glucose, Bld 621 (*) 70 - 99 (mg/dL)   Calcium, Ion 3.08 (*) 1.12 - 1.32 (mmol/L)   TCO2  26  0 - 100 (mmol/L)   Hemoglobin 11.9 (*) 12.0 - 15.0 (g/dL)   HCT 65.7 (*) 84.6 - 46.0 (%)  POCT I-STAT TROPONIN I  Component Value Range   Troponin i, poc 0.14 (*) 0.00 - 0.08 (ng/mL)   Comment NOTIFIED PHYSICIAN     Comment 3            Dg Chest Portable 1 View  01/02/2012  *RADIOLOGY REPORT*  Clinical Data: Shortness of breath  PORTABLE CHEST - 1 VIEW  Comparison: 10/10/2011; 10/09/2011; 06/22/2011  Findings:  Unchanged enlarged cardiac silhouette and mediastinal contours with atherosclerotic calcification of the aortic arch.  The pulmonary vasculature is indistinct with cephalization of flow.  Interval development of perihilar heterogeneous opacities.  No definite pleural effusion or pneumothorax.  Unchanged bones.  Possible dermal calcifications overlying the inferior outer quadrant of the left breast, similar to remote examination performed 10/20 12.  IMPRESSION: Overall findings most suggestive of pulmonary edema.  Original Report Authenticated By: Waynard Reeds, M.D.     Date: 01/02/2012  Rate: 139  Rhythm: sinus tachycardia  QRS Axis: normal  Intervals: PR prolonged  ST/T Wave abnormalities: nonspecific ST/T changes  Conduction Disutrbances:none  Narrative Interpretation: LVH with exacerbation of ST and T-wave changes versus previous EKG  Old EKG Reviewed: changes noted  EKG reviewed as above, presentation does not suggest STEMI  CRITICAL CARE Performed by: Advit Trethewey   Total critical care time: 45  Critical care time was exclusive of separately billable procedures and treating other patients.  Critical care was necessary to treat or prevent imminent or life-threatening deterioration.  Critical care was time spent personally by me on the following activities: development of treatment plan with patient and/or surrogate as well as nursing, discussions with consultants, evaluation of patient's response to treatment, examination of patient, obtaining history from  patient or surrogate, ordering and performing treatments and interventions, ordering and review of laboratory studies, ordering and review of radiographic studies, pulse oximetry and re-evaluation of patient's condition. IV nitroglycerin drip initiated for hypertension and acute pulmonary edema. Labs, x-ray and EKG obtained and reviewed as above. Medicine and nephrology consults obtained. Improving condition remains symptomatic.   4:49 AM d/w DR Briant Cedar, on call for nephrology, plan medical admission and will arrange for urgent dialysis.  4:57 AM d/w [facility] on call, Dr Toniann Fail - will admit to step down ICU.   MDM   Acute pulmonary edema in end-stage renal disease patient with history of heart failure. IV nitroglycerin drip initiated for hypertension. Plan medical admission and urgent dialysis as above.        Sunnie Nielsen, MD 01/02/12 [phone]

## 2012-01-02 NOTE — ED Notes (Signed)
Nitro gtt increased to 20 mcg-

## 2012-01-02 NOTE — Progress Notes (Signed)
Utilization Review Completed.Arliss Frisina T4/30/2013   

## 2012-01-02 NOTE — Consult Note (Signed)
[name] is an 51 y.o. female referred by Dr Toniann Fail   Chief Complaint: SOB HPI: 51 yo BF with ESRD on HD TTS presents to ER last with acute onset of SOB.  Multiple episodes of this in the past.  Heart cath 6/11 50% lad and 50% rca lesions.  Questionable compliance with her BP meds. Last HD SAt.  CXR in ER shows pulm edema  Past Medical History  Diagnosis Date  . Hypertension   . Anemia   . Chronic kidney disease   . Systolic CHF, acute on chronic   . Diabetes mellitus   . Coronary artery disease   . Peripheral vascular disease     Past Surgical History  Procedure Date  . Cardiac catheterization   . Abdominal hysterectomy   . Leg amputation below knee   . Above knee leg amputation   . Av fistula placement   . Vascular surgery     History reviewed. No pertinent family history. Social History:  reports that she has never smoked. She does not have any smokeless tobacco history on file. She reports that she does not drink alcohol or use illicit drugs.  Allergies: No Known Allergies  Medications Prior to Admission  Medication Sig Dispense Refill  . amLODipine (NORVASC) 10 MG tablet Take 10 mg by mouth daily.      Marland Kitchen aspirin EC 81 MG tablet Take 81 mg by mouth daily.      . calcium acetate, Phos Binder, (PHOSLYRA) 667 MG/5ML SOLN Take 2,001 mg by mouth See admin instructions. 15ml with snacks      . calcium acetate, Phos Binder, (PHOSLYRA) 667 MG/5ML SOLN Take 3,335 mg by mouth 3 (three) times daily with meals.      . cinacalcet (SENSIPAR) 60 MG tablet Take 60 mg by mouth daily.      Marland Kitchen gabapentin (NEURONTIN) 100 MG capsule Take 100 mg by mouth at bedtime.      . insulin aspart (NOVOLOG) 100 UNIT/ML injection Inject 1-9 Units into the skin 3 (three) times daily before meals. Sliding scale      . insulin glargine (LANTUS) 100 UNIT/ML injection Inject 27 Units into the skin at bedtime.       Marland Kitchen losartan (COZAAR) 50 MG tablet Take 50 mg by mouth at bedtime.       . metoprolol  (LOPRESSOR) 50 MG tablet Take 50 mg by mouth 2 (two) times daily.      . multivitamin (RENA-VIT) TABS tablet Take 1 tablet by mouth daily.  30 tablet  0  . pantoprazole (PROTONIX) 40 MG tablet Take 40 mg by mouth at bedtime as needed. For acid reflux         Lab Results: UA: ND  Basename 01/02/12 0235 01/02/12 0221  WBC -- 9.7  HGB 11.9* 11.0*  HCT 35.0* 33.5*  PLT -- 292   BMET  Basename 01/02/12 0235  NA 133*  K 4.9  CL 102  CO2 --  GLUCOSE 238*  BUN 81*  CREATININE 12.30*  CALCIUM --  PHOS --   LFT No results found for this basename: PROT,ALBUMIN,AST,ALT,ALKPHOS,BILITOT,BILIDIR,IBILI in the last 72 hours Dg Chest Portable 1 View  01/02/2012  *RADIOLOGY REPORT*  Clinical Data: Shortness of breath  PORTABLE CHEST - 1 VIEW  Comparison: 10/10/2011; 10/09/2011; 06/22/2011  Findings:  Unchanged enlarged cardiac silhouette and mediastinal contours with atherosclerotic calcification of the aortic arch.  The pulmonary vasculature is indistinct with cephalization of flow.  Interval development of perihilar heterogeneous  opacities.  No definite pleural effusion or pneumothorax.  Unchanged bones.  Possible dermal calcifications overlying the inferior outer quadrant of the left breast, similar to remote examination performed 10/20 12.  IMPRESSION: Overall findings most suggestive of pulmonary edema.  Original Report Authenticated By: Waynard Reeds, M.D.    ROS: no change vision Mild chest pressure + SOB No abd pain Cough productive whitish sputum No new arthritic co No new neuropathic co No dysuria  PHYSICAL EXAM: Blood pressure 219/122, pulse 99, temperature 97.1 F (36.2 C), temperature source Oral, resp. rate 25, weight 63.8 kg (140 lb 10.5 oz), SpO2 98.00%. HEENT: PERRLA, EOMI NECK:no JVD LUNGS:Bil crackles CARDIAC:Reg, tachy with S4 gallop ABD:+BS NTND EXT:no edema.  Rt AKA, Lt BKA  Lt UA AVG + bruit NEURO:Ox3 CNI  No asterixis  Assessment: 1. Pulm edema  Suspect  this is most likely sec to poorly controlled HTN but if enzymes turn + then may need repeat cath 2. HTN 3. DM 4. Anemia 5. Sec HPTH PLAN: 1. HD today 2. Discussed the need for her to follow BP at home and I think she should use clonidine on a PRN basis to help with BP control 3. Resume epo 4. She also needs to split up her meds as she is taking them all at one time and they are making her sick 5. Recheck CXR in AM   Autumm Hattery T 01/02/2012, 7:39 AM

## 2012-01-02 NOTE — Progress Notes (Signed)
[facility]   Subjective: Patient examined and the hemodialysis unit. No further chest pain but now having nausea and vomiting without diarrhea or abdominal pain. States had similar symptoms last night prior to arrival. Shortness of breath has resolved after hemodialysis.  Objective: Vital signs in last 24 hours: Temp:  [97.1 F (36.2 C)-98.1 F (36.7 C)] 97.3 F (36.3 C) (04/30 1115) Pulse Rate:  [85-136] 85  (04/30 1115) Resp:  [19-28] 25  (04/30 1115) BP: (117-232)/(39-122) 149/90 mmHg (04/30 1115) SpO2:  [94 %-100 %] 100 % (04/30 1115) FiO2 (%):  [2 %] 2 % (04/30 1115) Weight:  [60.3 kg (132 lb 15 oz)-63.8 kg (140 lb 10.5 oz)] 60.3 kg (132 lb 15 oz) (04/30 1115) Weight change:     Intake/Output from previous day:   Intake/Output this shift: Total I/O In: -  Out: 3185 [Other:3185]  General appearance: alert, cooperative, appears older than stated age and mild distress Resp: Mostly clear to auscultation bilaterally, slightly coarse midfield, 2 L nasal cannula oxygen saturating 100% Cardio: regular rate and rhythm, S1, S2 normal, no murmur, click, rub or gallop GI: soft, non-tender; bowel sounds normal; no masses,  no organomegaly Extremities: extremities normal, atraumatic, no cyanosis or edema Neurologic: Grossly normal  Lab Results:  Basename 01/02/12 1000 01/02/12 0235 01/02/12 0221  WBC 7.8 -- 9.7  HGB 10.0* 11.9* --  HCT 29.9* 35.0* --  PLT 249 -- 292   BMET  Basename 01/02/12 1000 01/02/12 0235  NA 136 133*  K 4.8 4.9  CL 91* 102  CO2 19 --  GLUCOSE 161* 238*  BUN 65* 81*  CREATININE 12.42* 12.30*  CALCIUM 9.9 --    Studies/Results: Dg Chest Portable 1 View  01/02/2012  *RADIOLOGY REPORT*  Clinical Data: Shortness of breath  PORTABLE CHEST - 1 VIEW  Comparison: 10/10/2011; 10/09/2011; 06/22/2011  Findings:  Unchanged enlarged cardiac silhouette and mediastinal contours with atherosclerotic calcification of the aortic arch.  The pulmonary  vasculature is indistinct with cephalization of flow.  Interval development of perihilar heterogeneous opacities.  No definite pleural effusion or pneumothorax.  Unchanged bones.  Possible dermal calcifications overlying the inferior outer quadrant of the left breast, similar to remote examination performed 10/20 12.  IMPRESSION: Overall findings most suggestive of pulmonary edema.  Original Report Authenticated By: Waynard Reeds, M.D.    Medications: I have reviewed the patient's current medications.  Assessment/Plan:  Principal Problem:  *Acute respiratory failure with hypoxia *Resolving after hemodialysis treatment *Treat underlying cause is of uncontrolled blood pressure and volume overload  Active Problems:  Pulmonary edema with congestive heart failure with reduced left ventricular function *Suspect onset after uncontrolled hypertension *Blood pressure better controlled after dialysis   HTN (hypertension)-accelerated/uncontrolled at admission *Continue amlodipine, losartan, and metoprolol *Nephrology recommends having the patient follow her blood pressure readings at home and adding when necessary clonidine at home are elevated blood pressure   CKD (chronic kidney disease) requiring chronic dialysis *Appreciate nephrology assistant *3185 cc removed with ultrafiltration today   DM (diabetes mellitus) *Continue Lantus and sliding scale insulin *CBGs are improving   Anemia *Hemoglobin stable and no signs of any active bleeding   PVD (peripheral vascular disease)   CAD in native artery *Troponin mildly elevated and EKG negative for acute ischemia *Suspect elevated cardiac secondary to chronic kidney disease and recent heart failure and volume overload  Disposition *Transfer to 6700 telemetry     LOS: 1 day   Junious Silk, ANP pager [phone]  [facility]  1 [email or web address] Password: [email or web address]  01/02/2012, 12:49 PM  I have examined the patient and reviewed  the chart. I agree with the above note.   Calvert Cantor, MD [phone]

## 2012-01-02 NOTE — ED Notes (Signed)
Nitro gtt increased to 15mcg

## 2012-01-02 NOTE — Progress Notes (Signed)
Hemodialysis-See flowsheet.  Pt experiencing severe cramps post HD, VS stable. 200cc bolous given post rinseback. Pt states cramping has resolved. Dr. Briant Cedar aware of issue.

## 2012-01-03 ENCOUNTER — Inpatient Hospital Stay (HOSPITAL_COMMUNITY): Payer: Medicare Other

## 2012-01-03 DIAGNOSIS — N186 End stage renal disease: Secondary | ICD-10-CM

## 2012-01-03 DIAGNOSIS — I10 Essential (primary) hypertension: Secondary | ICD-10-CM

## 2012-01-03 DIAGNOSIS — E1165 Type 2 diabetes mellitus with hyperglycemia: Secondary | ICD-10-CM

## 2012-01-03 DIAGNOSIS — J810 Acute pulmonary edema: Secondary | ICD-10-CM

## 2012-01-03 LAB — GLUCOSE, CAPILLARY
Glucose-Capillary: 235 mg/dL — ABNORMAL HIGH (ref 70–99)
Glucose-Capillary: 179 mg/dL — ABNORMAL HIGH (ref 70–99)

## 2012-01-03 MED ORDER — ONDANSETRON HCL 4 MG PO TABS: 4.0000 mg | ORAL_TABLET | Freq: Four times a day (QID) | ORAL | Status: DC | PRN

## 2012-01-03 MED ORDER — HEPARIN SODIUM (PORCINE) 1000 UNIT/ML DIALYSIS: 20.0000 [IU]/kg | INTRAMUSCULAR | Status: DC | PRN

## 2012-01-03 NOTE — Clinical Documentation Improvement (Signed)
CHF DOCUMENTATION CLARIFICATION QUERY  THIS DOCUMENT IS NOT A PERMANENT PART OF THE MEDICAL RECORD  TO RESPOND TO THE THIS QUERY, FOLLOW THE INSTRUCTIONS BELOW:  1. If needed, update documentation for the patient's encounter via the notes activity.  2. Access this query again and click edit on the In [employer].  3. After updating, or not, click F2 to complete all highlighted (required) fields concerning your review. Select "additional documentation in the medical record" OR "no additional documentation provided".  4. Click Sign note button.  5. The deficiency will fall out of your In Basket *Please let us know if you are not able to complete this workflow by phone or e-mail (listed below).  Please update your documentation within the medical record to reflect your response to this query.                                                                                    01/03/12  Dear [facility],  In a better effort to capture your patient's severity of illness, reflect appropriate length of stay and utilization of resources, a review of the patient medical record has revealed the following indicators the diagnosis of Heart Failure.    Based on your clinical judgment, please clarify and document in a progress note and/or discharge summary the clinical condition associated with the following supporting information:  In responding to this query please exercise your independent judgment.  The fact that a query is asked, does not imply that any particular answer is desired or expected.  Possible Clinical Conditions? Acute Systolic Congestive Heart Failure Acute Diastolic Congestive Heart Failure Acute Systolic & Diastolic Congestive Heart Failure Acute on Chronic Systolic Congestive Heart Failure Acute on Chronic Diastolic Congestive Heart Failure Acute on Chronic Systolic & Diastolic  Congestive Heart Failure Chronic Systolic Congestive Heart Failure Chronic Diastolic  Congestive Heart Failure Chronic Systolic & Diastolic Congestive Heart Failure Other Condition________________________________________ Cannot Clinically Determine  Supporting Information:  Risk Factors:(As per H&P) "History of Systolic CHF, acute on chronic"   Signs & Symptoms:(As per NOTES) " Pulmonary edema with congestive heart failure with reduced left ventricular function"  Diagnostics:01-02-12 =Pro B Natriuretic peptide=16007.0 (H)   Treatment:(As per notes) DIALYSIS  NOT ANSWERED  Thank You,  Joanette Gula Delk RN,BSN  Clinical Documentation Specialist: [phone] Pager Health Information Management 

## 2012-01-03 NOTE — Progress Notes (Addendum)
DISCHARGE SUMMARY  Oretta Berkland Voorhis  MR#: [id number]  DOB:1960/10/17  Date of Admission: 01/01/2012 Date of Discharge: 01/03/2012  Attending Physician:Nitish Roes  Patient's WUJ:[id number],OZHYQM A, MD, MD  Consults:Treatment Team:  Dyke Maes, MD-  Discharge Diagnoses: Present on Admission:  .Acute pulmonary edema .ESRD (end stage renal disease) on dialysis .HTN (hypertension)-accelerated/uncontrolled at admission .DM (diabetes mellitus) .Anemia .CAD in native artery .CKD (chronic kidney disease) requiring chronic dialysis .Pulmonary edema with congestive heart failure with reduced left ventricular function .Acute respiratory failure with hypoxia .PVD (peripheral vascular disease)   Initial presentation: 51 year-old female with known history of ESRD on hemodialysis on Tuesdays Thursdays and Saturday, LV dysfunction last EF measured was 45-50%, diabetes mellitus2, hypertension presented to the ER with sudden onset of shortness of breath which started off last night at around 10 PM. Patient states she did not miss her scheduled dialysis. Initially patient had some chest pressure which has resolved. In the ER patient was found to be hypertensive and chest x-ray was showing features of acute pulmonary edema. ER physician Dr. Dierdre Highman had discussed with Dr. Briant Cedar nephrologist On-call. Dr. Briant Cedar is arranging for dialysis and at this time patient will be admitted under hospitalist service   Hospital Course: *Acute respiratory failure with hypoxia  *Resolved after hemodialysis treatment  * underlying cause is of uncontrolled blood pressure and volume overload   Pulmonary edema with congestive heart failure with reduced left ventricular function  *Suspect onset after uncontrolled hypertension  *Blood pressure better controlled after dialysis  HTN (hypertension)-accelerated/uncontrolled at admission  *Continue amlodipine, losartan, and metoprolol  *Nephrology  recommends having the patient follow her blood pressure readings at home and adding when necessary clonidine at home are elevated blood pressure . Also educated about being compliant to blood pressure medications.  CKD (chronic kidney disease) requiring chronic dialysis  *Appreciate nephrology assistant  *3185 cc removed with ultrafiltration at the time of dialysis and further management as per renal and outpatient dialysis. DM (diabetes mellitus)  *Continue Lantus and sliding scale insulin  *CBGs are improving  Anemia  *Hemoglobin stable and no signs of any active bleeding  PVD (peripheral vascular disease)  CAD in native artery  *Troponin mildly elevated and EKG negative for acute ischemia  *Suspect elevated cardiac secondary to chronic kidney disease and recent heart failure and volume overload       Medication List  As of 01/03/2012  2:46 PM   TAKE these medications         amLODipine 10 MG tablet   Commonly known as: NORVASC   Take 10 mg by mouth daily.      aspirin EC 81 MG tablet   Take 81 mg by mouth daily.      calcium acetate (Phos Binder) 667 MG/5ML Soln   Commonly known as: PHOSLYRA   Take 2,001 mg by mouth See admin instructions. 15ml with snacks      calcium acetate (Phos Binder) 667 MG/5ML Soln   Commonly known as: PHOSLYRA   Take 3,335 mg by mouth 3 (three) times daily with meals.      cinacalcet 60 MG tablet   Commonly known as: SENSIPAR   Take 60 mg by mouth daily.      gabapentin 100 MG capsule   Commonly known as: NEURONTIN   Take 100 mg by mouth at bedtime.      insulin aspart 100 UNIT/ML injection   Commonly known as: novoLOG   Inject 1-9 Units into the skin 3 (three) times daily  before meals. Sliding scale      insulin glargine 100 UNIT/ML injection   Commonly known as: LANTUS   Inject 27 Units into the skin at bedtime.      losartan 50 MG tablet   Commonly known as: COZAAR   Take 50 mg by mouth at bedtime.      metoprolol 50 MG tablet    Commonly known as: LOPRESSOR   Take 50 mg by mouth 2 (two) times daily.      multivitamin Tabs tablet   Take 1 tablet by mouth daily.      ondansetron 4 MG tablet   Commonly known as: ZOFRAN   Take 1 tablet (4 mg total) by mouth every 6 (six) hours as needed for nausea.      pantoprazole 40 MG tablet   Commonly known as: PROTONIX   Take 40 mg by mouth at bedtime as needed. For acid reflux             Day of Discharge BP 99/62  Pulse 20  Temp(Src) 97.4 F (36.3 C) (Oral)  Resp 20  Ht 5\' 2"  (1.575 m)  Wt 60.4 kg (133 lb 2.5 oz)  BMI 24.35 kg/m2  SpO2 96%  Physical Exam: General appearance: alert, cooperative, appears older than stated age and mild distress  Resp: Mostly clear to auscultation bilaterally, slightly coarse midfield, 2 L nasal cannula oxygen saturating 100%  Cardio: regular rate and rhythm, S1, S2 normal, no murmur, click, rub or gallop  GI: soft, non-tender; bowel sounds normal; no masses, no organomegaly  Extremities: extremities normal, atraumatic, no cyanosis or edema  Neurologic: Grossly normal   Results for orders placed during the hospital encounter of 01/01/12 (from the past 24 hour(s))  GLUCOSE, CAPILLARY     Status: Abnormal   Collection Time   01/02/12  3:10 PM      Component Value Range   Glucose-Capillary 219 (*) 70 - 99 (mg/dL)  GLUCOSE, CAPILLARY     Status: Abnormal   Collection Time   01/02/12  4:30 PM      Component Value Range   Glucose-Capillary 136 (*) 70 - 99 (mg/dL)   Comment 1 Notify RN    GLUCOSE, CAPILLARY     Status: Abnormal   Collection Time   01/02/12  8:39 PM      Component Value Range   Glucose-Capillary 181 (*) 70 - 99 (mg/dL)  CARDIAC PANEL(CRET KIN+CKTOT+MB+TROPI)     Status: Abnormal   Collection Time   01/02/12 10:07 PM      Component Value Range   Total CK 114  7 - 177 (U/L)   CK, MB 2.9  0.3 - 4.0 (ng/mL)   Troponin I 0.40 (*) <0.30 (ng/mL)   Relative Index 2.5  0.0 - 2.5   GLUCOSE, CAPILLARY     Status:  Abnormal   Collection Time   01/03/12  8:09 AM      Component Value Range   Glucose-Capillary 235 (*) 70 - 99 (mg/dL)   Comment 1 Notify RN     Comment 2 Documented in Chart    GLUCOSE, CAPILLARY     Status: Abnormal   Collection Time   01/03/12 12:27 PM      Component Value Range   Glucose-Capillary 179 (*) 70 - 99 (mg/dL)    Disposition: Home   Follow-up Appts: Discharge Orders    Future Orders Please Complete By Expires   Diet - low sodium heart healthy  Discharge instructions      Comments:   Follow up with your cardiologist in one week.   Activity as tolerated - No restrictions           Tests Needing Follow-up:  BP AT HOME Time spent in discharge (includes decision making & examination of pt): 62 minutes  Signed: Kacin Dancy 01/03/2012, 2:46 PM

## 2012-01-03 NOTE — Progress Notes (Signed)
AVS reviewed with pt. Pt able to verbalize understanding of AVS with no questions. Pt remains stable. No complaints of pain or signs of distress. IV and tele DC'd. Pt escorted to exit of facility via wheelchair. [name], [name]

## 2012-01-03 NOTE — Progress Notes (Signed)
[facility] Progress Note  Subjective:  Feels much better; thinks she could go home; reading newspaper  Objective Filed Vitals:   01/02/12 1516 01/02/12 1800 01/02/12 2044 01/03/12 0450  BP: 142/82 113/66 104/64 103/61  Pulse: 95 91 80 85  Temp: 98.7 F (37.1 C) 98 F (36.7 C) 98.1 F (36.7 C) 98.5 F (36.9 C)  TempSrc: Oral Oral Oral Oral  Resp: 20 20 20 20   Height:   5\' 2"  (1.575 m)   Weight: 60.4 kg (133 lb 2.5 oz)  60.4 kg (133 lb 2.5 oz)   SpO2: 97% 91% 94% 96%   Physical Exam General: NAD Heart: RRR Lungs: no wheezes or rales Abdomen: soft NT Extremities: right AKA and left BKA - no significant edema Dialysis Access: left upper AVGG  Assessment/Plan: 1. Pulmonary edema resolved with HD  2. ESRD - TTS HD Thursday if not D/c; Does not stay on full treatments as a rule 3. Anemia - cotinue ESA 4. Secondary hyperparathyroidism - uncontrolled with elevated P of 8 - 11; last iPTH 1320; Zemplar held here due to high product as outpt. 5.  Hypertension/volume - Poorly controlled; Dr. Briant Cedar recommends prn clonidine coverage for elevated BP at home.  However, not that she has been here 24 hours, it is obvious that if she takes her BP meds,it is controlled; Her average pre HD BPs are approx 230/120 with post BP of 160/95!! 6. Medical noncompliance - longstanding issue as evidenced by iPTH, P and outpt hypertension 7.PVD - high risk for CVA given poorly controlled HTN 8. CAD -high risk for further events  Additional Objective Labs: Basic Metabolic Panel:  Lab 01/02/12 9604 01/02/12 0235  NA 136 133*  K 4.8 4.9  CL 91* 102  CO2 19 --  GLUCOSE 161* 238*  BUN 65* 81*  CREATININE 12.42* 12.30*  CALCIUM 9.9 --  ALB -- --  PHOS -- --   Liver Function Tests:  Lab 01/02/12 1000  AST 13  ALT 7  ALKPHOS 92  BILITOT 0.2*  PROT 7.2  ALBUMIN 3.4*   CBC:  Lab 01/02/12 1000 01/02/12 0235 01/02/12 0221  WBC 7.8 -- 9.7  NEUTROABS -- -- --  HGB 10.0*  11.9* 11.0*  HCT 29.9* 35.0* 33.5*  MCV 84.5 -- 85.2  PLT 249 -- 292   BCardiac Enzymes:  Lab 01/02/12 2207 01/02/12 1202 01/02/12 1000 01/02/12 0221  CKTOTAL 114 118 103 96  CKMB 2.9 5.1* 4.2* 3.6  CKMBINDEX -- -- -- --  TROPONINI 0.40* 0.78* 0.51* <0.30   CBG:  Lab 01/03/12 0809 01/02/12 2039 01/02/12 1630 01/02/12 1510 01/02/12 1319  GLUCAP 235* 181* 136* 219* 172*   Iron Studies: No results found for this basename: IRON,TIBC,TRANSFERRIN,FERRITIN in the last 72 hours Studies/Results: Dg Chest Port 1 View  01/03/2012  *RADIOLOGY REPORT*  Clinical Data: Follow-up pulmonary edema  PORTABLE CHEST - 1 VIEW  Comparison: Yesterday  Findings: Mild cardiomegaly.  Low volumes.    Mild atelectasis left base.  Otherwise clear. Edema resolved.  IMPRESSION: Mild cardiomegaly.  Minimal atelectasis at the left base. Edema resolved.  Original Report Authenticated By: Donavan Burnet, M.D.   Dg Chest Portable 1 View  01/02/2012  *RADIOLOGY REPORT*  Clinical Data: Shortness of breath  PORTABLE CHEST - 1 VIEW  Comparison: 10/10/2011; 10/09/2011; 06/22/2011  Findings:  Unchanged enlarged cardiac silhouette and mediastinal contours with atherosclerotic calcification of the aortic arch.  The pulmonary vasculature is indistinct with cephalization of flow.  Interval development of  perihilar heterogeneous opacities.  No definite pleural effusion or pneumothorax.  Unchanged bones.  Possible dermal calcifications overlying the inferior outer quadrant of the left breast, similar to remote examination performed 10/20 12.  IMPRESSION: Overall findings most suggestive of pulmonary edema.  Original Report Authenticated By: Waynard Reeds, M.D.   Medications:    . DISCONTD: nitroGLYCERIN 5 mcg/min (01/02/12 0246)      . amLODipine  10 mg Oral Daily  . aspirin EC  81 mg Oral Daily  . calcium acetate (Phos Binder)  3,335 mg Oral TID WC  . cinacalcet  60 mg Oral Q breakfast  . darbepoetin (ARANESP) injection -  DIALYSIS  60 mcg Intravenous Q Tue-HD  . gabapentin  100 mg Oral QHS  . heparin  5,000 Units Subcutaneous Q8H  . insulin aspart  0-9 Units Subcutaneous TID WC  . insulin glargine  27 Units Subcutaneous QHS  . losartan  100 mg Oral QHS  . metoprolol  50 mg Oral BID  . multivitamin  1 tablet Oral QHS  . nitroGLYCERIN  2-200 mcg/min Intravenous Once  . pantoprazole  40 mg Oral Q1200  . sodium chloride  3 mL Intravenous Q12H  . DISCONTD: sodium chloride  3 mL Intravenous Q12H    I  have reviewed scheduled and prn medications.  Sheffield Slider, PA-C [facility] Beeper [phone]  01/03/2012,9:31 AM  LOS: 2 days

## 2012-01-03 NOTE — Progress Notes (Signed)
I have seen and examined this patient and agree with the assessment/plan as outlined above by [facility]. Showing clinical improvement after HD- compliance re-iterated. May be able to DC today.  Juston Goheen K.,MD 01/03/2012 10:06 AM

## 2012-01-08 ENCOUNTER — Encounter (HOSPITAL_COMMUNITY): Payer: Self-pay | Admitting: *Deleted

## 2012-01-08 ENCOUNTER — Emergency Department (HOSPITAL_COMMUNITY): Payer: Medicare Other

## 2012-01-08 ENCOUNTER — Emergency Department (HOSPITAL_COMMUNITY)
Admission: EM | Admit: 2012-01-08 | Discharge: 2012-01-09 | Disposition: A | Discharge status: Home / Self Care | Payer: Medicare Other | Attending: Nephrology | Admitting: Nephrology

## 2012-01-08 DIAGNOSIS — D649 Anemia, unspecified: Secondary | ICD-10-CM | POA: Insufficient documentation

## 2012-01-08 DIAGNOSIS — Z79899 Other long term (current) drug therapy: Secondary | ICD-10-CM | POA: Insufficient documentation

## 2012-01-08 DIAGNOSIS — N186 End stage renal disease: Secondary | ICD-10-CM | POA: Insufficient documentation

## 2012-01-08 DIAGNOSIS — I509 Heart failure, unspecified: Secondary | ICD-10-CM | POA: Insufficient documentation

## 2012-01-08 DIAGNOSIS — I161 Hypertensive emergency: Secondary | ICD-10-CM

## 2012-01-08 DIAGNOSIS — R0989 Other specified symptoms and signs involving the circulatory and respiratory systems: Secondary | ICD-10-CM | POA: Insufficient documentation

## 2012-01-08 DIAGNOSIS — I5022 Chronic systolic (congestive) heart failure: Secondary | ICD-10-CM | POA: Insufficient documentation

## 2012-01-08 DIAGNOSIS — I120 Hypertensive chronic kidney disease with stage 5 chronic kidney disease or end stage renal disease: Secondary | ICD-10-CM | POA: Insufficient documentation

## 2012-01-08 DIAGNOSIS — Z992 Dependence on renal dialysis: Secondary | ICD-10-CM

## 2012-01-08 DIAGNOSIS — J811 Chronic pulmonary edema: Secondary | ICD-10-CM | POA: Insufficient documentation

## 2012-01-08 DIAGNOSIS — R0609 Other forms of dyspnea: Secondary | ICD-10-CM | POA: Insufficient documentation

## 2012-01-08 DIAGNOSIS — Z9889 Other specified postprocedural states: Secondary | ICD-10-CM | POA: Insufficient documentation

## 2012-01-08 DIAGNOSIS — R0602 Shortness of breath: Secondary | ICD-10-CM | POA: Insufficient documentation

## 2012-01-08 DIAGNOSIS — Z794 Long term (current) use of insulin: Secondary | ICD-10-CM | POA: Insufficient documentation

## 2012-01-08 DIAGNOSIS — E119 Type 2 diabetes mellitus without complications: Secondary | ICD-10-CM | POA: Insufficient documentation

## 2012-01-08 DIAGNOSIS — Z7982 Long term (current) use of aspirin: Secondary | ICD-10-CM | POA: Insufficient documentation

## 2012-01-08 DIAGNOSIS — I739 Peripheral vascular disease, unspecified: Secondary | ICD-10-CM | POA: Insufficient documentation

## 2012-01-08 DIAGNOSIS — I2510 Atherosclerotic heart disease of native coronary artery without angina pectoris: Secondary | ICD-10-CM | POA: Insufficient documentation

## 2012-01-08 LAB — CBC
Hemoglobin: 9.6 g/dL — ABNORMAL LOW (ref 12.0–15.0)
MCH: 28.3 pg (ref 26.0–34.0)
MCHC: 33 g/dL (ref 30.0–36.0)
Platelets: 211 10*3/uL (ref 150–400)
RDW: 15.9 % — ABNORMAL HIGH (ref 11.5–15.5)

## 2012-01-08 LAB — DIFFERENTIAL
Basophils Absolute: 0 10*3/uL (ref 0.0–0.1)
Basophils Relative: 0 % (ref 0–1)
Eosinophils Absolute: 0.2 10*3/uL (ref 0.0–0.7)
Neutro Abs: 5.9 10*3/uL (ref 1.7–7.7)
Neutrophils Relative %: 77 % (ref 43–77)

## 2012-01-08 LAB — BASIC METABOLIC PANEL
Calcium: 8.4 mg/dL (ref 8.4–10.5)
GFR calc Af Amer: 4 mL/min — ABNORMAL LOW (ref >90)
GFR calc non Af Amer: 4 mL/min — ABNORMAL LOW (ref >90)
Potassium: 5 mEq/L (ref 3.5–5.1)
Sodium: 150 mEq/L — ABNORMAL HIGH (ref 135–145)

## 2012-01-08 LAB — GLUCOSE, CAPILLARY: Glucose-Capillary: 389 mg/dL — ABNORMAL HIGH (ref 70–99)

## 2012-01-08 MED ORDER — INSULIN ASPART 100 UNIT/ML ~~LOC~~ SOLN: 1.0000 [IU] | Freq: Three times a day (TID) | SUBCUTANEOUS | Status: DC

## 2012-01-08 MED ORDER — LOSARTAN POTASSIUM 50 MG PO TABS: 50.0000 mg | ORAL_TABLET | Freq: Every day | ORAL | Status: DC

## 2012-01-08 MED ORDER — NITROGLYCERIN 0.4 MG SL SUBL
0.4000 mg | SUBLINGUAL_TABLET | Freq: Once | SUBLINGUAL | Status: AC
  Administered 2012-01-08: 0.4 mg via SUBLINGUAL

## 2012-01-08 MED ORDER — INSULIN GLARGINE 100 UNIT/ML ~~LOC~~ SOLN: 27.0000 [IU] | Freq: Every day | SUBCUTANEOUS | Status: DC

## 2012-01-08 MED ORDER — CALCIUM ACETATE (PHOS BINDER) 667 MG/5ML PO SOLN: 2001.0000 mg | ORAL | Status: DC

## 2012-01-08 MED ORDER — METOPROLOL TARTRATE 25 MG PO TABS: 50.0000 mg | ORAL_TABLET | Freq: Two times a day (BID) | ORAL | Status: DC

## 2012-01-08 MED ORDER — ONDANSETRON HCL 8 MG PO TABS: 4.0000 mg | ORAL_TABLET | Freq: Four times a day (QID) | ORAL | Status: DC | PRN

## 2012-01-08 MED ORDER — PANTOPRAZOLE SODIUM 40 MG PO TBEC: 40.0000 mg | DELAYED_RELEASE_TABLET | Freq: Every day | ORAL | Status: DC

## 2012-01-08 MED ORDER — AMLODIPINE BESYLATE 10 MG PO TABS: 10.0000 mg | ORAL_TABLET | Freq: Every day | ORAL | Status: DC

## 2012-01-08 MED ORDER — CALCIUM ACETATE (PHOS BINDER) 667 MG/5ML PO SOLN: 3335.0000 mg | Freq: Three times a day (TID) | ORAL | Status: DC

## 2012-01-08 MED ORDER — HEPARIN SODIUM (PORCINE) 1000 UNIT/ML DIALYSIS
100.0000 [IU]/kg | INTRAMUSCULAR | Status: DC | PRN
  Administered 2012-01-08: 6000 [IU] via INTRAVENOUS_CENTRAL

## 2012-01-08 MED ORDER — LISINOPRIL 40 MG PO TABS: 40.0000 mg | ORAL_TABLET | Freq: Every day | ORAL | Status: DC

## 2012-01-08 MED ORDER — GABAPENTIN 100 MG PO CAPS: 100.0000 mg | ORAL_CAPSULE | Freq: Every day | ORAL | Status: DC

## 2012-01-08 MED ORDER — RENA-VITE PO TABS: 1.0000 | ORAL_TABLET | Freq: Every day | ORAL | Status: DC

## 2012-01-08 MED ORDER — CINACALCET HCL 30 MG PO TABS: 60.0000 mg | ORAL_TABLET | Freq: Every day | ORAL | Status: DC

## 2012-01-08 MED ORDER — ASPIRIN EC 81 MG PO TBEC: 81.0000 mg | DELAYED_RELEASE_TABLET | Freq: Every day | ORAL | Status: DC

## 2012-01-08 NOTE — ED Provider Notes (Addendum)
History     CSN: [id number]  Arrival date & time 01/08/12  1919   First MD Initiated Contact with Patient 01/08/12 1925      Chief Complaint  Patient presents with  . Respiratory Distress    (Consider location/radiation/quality/duration/timing/severity/associated sxs/prior treatment) HPI History limited by patient acuity and bipap mask. History provided by pt and EMS. Pt with history of ESRD on HD TThSa reports sudden onset of dyspnea this evening after a shower. She has had dry cough but no chest pain. She has history of same with episodes of pulmonary edema. EMS noted her BP to be high and SpO2 in the 70s on their arrival. She was started on CPAP with improvement in symptoms and SpO2 and brought to the ED for eval.   Past Medical History  Diagnosis Date  . Hypertension   . Anemia   . Systolic CHF, acute on chronic   . Coronary artery disease   . Peripheral vascular disease   . Dialysis patient 01/02/12    [name]; [place]; Thurs; Sat.  . CHF (congestive heart failure)   . Angina   . Shortness of breath 01/02/12    "@rest ; lying down; w/exertion"  . Type II diabetes mellitus   . Blood transfusion   . End stage kidney disease     Past Surgical History  Procedure Date  . Cardiac catheterization   . 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 Av fistula placement ~ 2003    left upper arm  . Abdominal hysterectomy 1990's  . Vascular surgery   . Arteriovenous graft placement 2011    left upper arm    History reviewed. No pertinent family history.  History  Substance Use Topics  . Smoking status: Never Smoker   . Smokeless tobacco: Never Used  . Alcohol Use: No    OB History    Grav Para Term Preterm Abortions TAB SAB Ect Mult Living                  Review of Systems Unable to assess due to acuity and bipap  Allergies  Review of patient's allergies indicates no known allergies.  Home Medications    Current Outpatient Rx  Name Route Sig Dispense Refill  . AMLODIPINE BESYLATE 10 MG PO TABS Oral Take 10 mg by mouth at bedtime.     . ASPIRIN EC 81 MG PO TBEC Oral Take 81 mg by mouth daily.    Marland Kitchen CALCIUM ACETATE (PHOS BINDER) 667 MG/5ML PO SOLN Oral Take 2,001 mg by mouth See admin instructions. 15ml with snacks    . CALCIUM ACETATE (PHOS BINDER) 667 MG/5ML PO SOLN Oral Take 3,335 mg by mouth 3 (three) times daily with meals.    Marland Kitchen CINACALCET HCL 60 MG PO TABS Oral Take 60 mg by mouth daily.    Marland Kitchen GABAPENTIN 100 MG PO CAPS Oral Take 100 mg by mouth at bedtime.    . INSULIN ASPART 100 UNIT/ML [place] SOLN Subcutaneous Inject 1-9 Units into the skin 3 (three) times daily before meals. Sliding scale    . INSULIN GLARGINE 100 UNIT/ML [place] SOLN Subcutaneous Inject 27 Units into the skin at bedtime.     Marland Kitchen LISINOPRIL 40 MG PO TABS Oral Take 40 mg by mouth at bedtime.    Marland Kitchen LOSARTAN POTASSIUM 50 MG PO TABS Oral Take 50 mg by mouth at bedtime.     Marland Kitchen  METOPROLOL TARTRATE 50 MG PO TABS Oral Take 50 mg by mouth 2 (two) times daily.    Marland Kitchen RENA-VITE PO TABS Oral Take 1 tablet by mouth daily. 30 tablet 0  . ONDANSETRON HCL 4 MG PO TABS Oral Take 4 mg by mouth every 6 (six) hours as needed. For nausea.    Marland Kitchen PANTOPRAZOLE SODIUM 40 MG PO TBEC Oral Take 40 mg by mouth at bedtime as needed. For acid reflux      BP 190/99  Pulse 99  Temp(Src) 96.9 F (36.1 C) (Axillary)  Resp 20  SpO2 100%  Physical Exam  Nursing note and vitals reviewed. Constitutional: She is oriented to person, place, and time. She appears well-developed. She appears distressed.  HENT:  Head: Normocephalic and atraumatic.  Eyes: EOM are normal. Pupils are equal, round, and reactive to light.  Neck: Normal range of motion. Neck supple.  Cardiovascular: Normal heart sounds and intact distal pulses.   Pulmonary/Chest: She is in respiratory distress. She has rales.  Abdominal: Soft. Bowel sounds are normal. She exhibits no distension. There is  no tenderness.  Musculoskeletal: She exhibits no edema and no tenderness.       S/p bilateral LE amputation; LUE fistula has normal thrill  Neurological: She is alert and oriented to person, place, and time. She has normal strength. No cranial nerve deficit or sensory deficit.  Skin: Skin is warm and dry. No rash noted.  Psychiatric: She has a normal mood and affect.    ED Course  Procedures (including critical care time)  Labs Reviewed  GLUCOSE, CAPILLARY - Abnormal; Notable for the following:    Glucose-Capillary 389 (*)    All other components within normal limits  CBC  DIFFERENTIAL   Dg Chest Port 1 View  01/08/2012  *RADIOLOGY REPORT*  Clinical Data: Short of breath.  Difficulty breathing.  PORTABLE CHEST - 1 VIEW  Comparison: 01/03/2012.  Findings: Cardiomegaly remains present, more pronounced than on prior exam most similar to the prior study of 10/09/2011. Interstitial and alveolar pulmonary edema is present with Charyl Dancer B lines in the periphery.  Aortic arch atherosclerosis.  IMPRESSION: Moderate CHF.  Original Report Authenticated By: Andreas Newport, M.D.     No diagnosis found.    MDM   Date: 01/08/2012  Rate: 109  Rhythm: sinus tachycardia  QRS Axis: normal  Intervals: normal  ST/T Wave abnormalities: nonspecific ST changes  Conduction Disutrbances:none  Narrative Interpretation: LVH with repol abnormalities  Old EKG Reviewed: unchanged  9:33 PM Pt continues to rest comfortably on BiPAP. She has been seen by Dr. Darrick Penna who is planning dialysis tonight and discharge when she is done. POC Chemistry and Trop have not crossed over, Trop is slightly elevated but this is unlikely to represent cardiac ischemia as a cause of her symptoms.     CRITICAL CARE Performed by: Pollyann Savoy   Total critical care time:  Critical care time was exclusive of separately billable procedures and treating other patients.  Critical care was necessary to treat or  prevent imminent or life-threatening deterioration.  Critical care was time spent personally by me on the following activities: development of treatment plan with patient and/or surrogate as well as nursing, discussions with consultants, evaluation of patient's response to treatment, examination of patient, obtaining history from patient or surrogate, ordering and performing treatments and interventions, ordering and review of laboratory studies, ordering and review of radiographic studies, pulse oximetry and re-evaluation of patient's condition.     Kanen Mottola B.  Bernette Mayers, MD 01/08/12 2135  Bonnita Levan. Bernette Mayers, MD 01/08/12 2136

## 2012-01-08 NOTE — ED Notes (Signed)
Chem 8 and Troponin results given to Dr. Bernette Mayers by B. Bing Plume, EMT

## 2012-01-08 NOTE — Discharge Summary (Addendum)
DISCHARGE SUMMARY  [name]  MR#: [id number]  DOB:[birth year]  Date of Admission: 01/01/2012 Date of Discharge: 01/03/2012  Attending Physician:Dezarae Mcclaran  Patient's MWN:[id number], MD, MD  Consults:Treatment Team:  Dyke Maes, MD-  Discharge Diagnoses: Present on Admission:  .Acute pulmonary edema .ESRD (end stage renal disease) on dialysis .HTN (hypertension)-accelerated/uncontrolled at admission .DM (diabetes mellitus) .Anemia .CAD in native artery .CKD (chronic kidney disease) requiring chronic dialysis .Pulmonary edema with congestive heart failure with reduced left ventricular function .Acute respiratory failure with hypoxia .PVD (peripheral vascular disease)   Initial presentation: 51 year-old female with known history of ESRD on hemodialysis on Tuesdays Thursdays and Saturday, LV dysfunction last EF measured was 45-50%, diabetes mellitus2, hypertension presented to the ER with sudden onset of shortness of breath which started off last night at around 10 PM. Patient states she did not miss her scheduled dialysis. Initially patient had some chest pressure which has resolved. In the ER patient was found to be hypertensive and chest x-ray was showing features of acute pulmonary edema. ER physician Dr. Dierdre Highman had discussed with Dr. Briant Cedar nephrologist On-call. Dr. Briant Cedar is arranging for dialysis and at this time patient will be admitted under hospitalist service   Hospital Course: *Acute respiratory failure with hypoxia  *Resolved after hemodialysis treatment  * underlying cause is of uncontrolled blood pressure and volume overload   Pulmonary edema with congestive heart failure with reduced left ventricular function / Chronic Systolic Heart Failure: *Suspect onset after uncontrolled hypertension  *Blood pressure better controlled after dialysis  HTN (hypertension)-accelerated/uncontrolled at admission  *Continue amlodipine, losartan, and  metoprolol  *Nephrology recommends having the patient follow her blood pressure readings at home and adding when necessary clonidine at home are elevated blood pressure . Also educated about being compliant to blood pressure medications.  CKD (chronic kidney disease) requiring chronic dialysis  *Appreciate nephrology assistant  *3185 cc removed with ultrafiltration at the time of dialysis and further management as per renal and outpatient dialysis. DM (diabetes mellitus)  *Continue Lantus and sliding scale insulin  *CBGs are improving  Anemia  *Hemoglobin stable and no signs of any active bleeding  PVD (peripheral vascular disease)  CAD in native artery  *Troponin mildly elevated and EKG negative for acute ischemia  *Suspect elevated cardiac secondary to chronic kidney disease and recent heart failure and volume overload       Medication List  As of 01/03/2012  2:46 PM   TAKE these medications         amLODipine 10 MG tablet   Commonly known as: NORVASC   Take 10 mg by mouth daily.      aspirin EC 81 MG tablet   Take 81 mg by mouth daily.      calcium acetate (Phos Binder) 667 MG/5ML Soln   Commonly known as: PHOSLYRA   Take 2,001 mg by mouth See admin instructions. 15ml with snacks      calcium acetate (Phos Binder) 667 MG/5ML Soln   Commonly known as: PHOSLYRA   Take 3,335 mg by mouth 3 (three) times daily with meals.      cinacalcet 60 MG tablet   Commonly known as: SENSIPAR   Take 60 mg by mouth daily.      gabapentin 100 MG capsule   Commonly known as: NEURONTIN   Take 100 mg by mouth at bedtime.      insulin aspart 100 UNIT/ML injection   Commonly known as: novoLOG   Inject 1-9 Units into the skin  3 (three) times daily before meals. Sliding scale      insulin glargine 100 UNIT/ML injection   Commonly known as: LANTUS   Inject 27 Units into the skin at bedtime.      losartan 50 MG tablet   Commonly known as: COZAAR   Take 50 mg by mouth at bedtime.       metoprolol 50 MG tablet   Commonly known as: LOPRESSOR   Take 50 mg by mouth 2 (two) times daily.      multivitamin Tabs tablet   Take 1 tablet by mouth daily.      ondansetron 4 MG tablet   Commonly known as: ZOFRAN   Take 1 tablet (4 mg total) by mouth every 6 (six) hours as needed for nausea.      pantoprazole 40 MG tablet   Commonly known as: PROTONIX   Take 40 mg by mouth at bedtime as needed. For acid reflux             Day of Discharge BP 99/62  Pulse 20  Temp(Src) 97.4 F (36.3 C) (Oral)  Resp 20  Ht 5\' 2"  (1.575 m)  Wt 60.4 kg (133 lb 2.5 oz)  BMI 24.35 kg/m2  SpO2 96%  Physical Exam: General appearance: alert, cooperative, appears older than stated age and mild distress  Resp: Mostly clear to auscultation bilaterally, slightly coarse midfield, 2 L nasal cannula oxygen saturating 100%  Cardio: regular rate and rhythm, S1, S2 normal, no murmur, click, rub or gallop  GI: soft, non-tender; bowel sounds normal; no masses, no organomegaly  Extremities: extremities normal, atraumatic, no cyanosis or edema  Neurologic: Grossly normal   Results for orders placed during the hospital encounter of 01/01/12 (from the past 24 hour(s))  GLUCOSE, CAPILLARY     Status: Abnormal   Collection Time   01/02/12  3:10 PM      Component Value Range   Glucose-Capillary 219 (*) 70 - 99 (mg/dL)  GLUCOSE, CAPILLARY     Status: Abnormal   Collection Time   01/02/12  4:30 PM      Component Value Range   Glucose-Capillary 136 (*) 70 - 99 (mg/dL)   Comment 1 Notify RN    GLUCOSE, CAPILLARY     Status: Abnormal   Collection Time   01/02/12  8:39 PM      Component Value Range   Glucose-Capillary 181 (*) 70 - 99 (mg/dL)  CARDIAC PANEL(CRET KIN+CKTOT+MB+TROPI)     Status: Abnormal   Collection Time   01/02/12 10:07 PM      Component Value Range   Total CK 114  7 - 177 (U/L)   CK, MB 2.9  0.3 - 4.0 (ng/mL)   Troponin I 0.40 (*) <0.30 (ng/mL)   Relative Index 2.5  0.0 - 2.5     GLUCOSE, CAPILLARY     Status: Abnormal   Collection Time   01/03/12  8:09 AM      Component Value Range   Glucose-Capillary 235 (*) 70 - 99 (mg/dL)   Comment 1 Notify RN     Comment 2 Documented in Chart    GLUCOSE, CAPILLARY     Status: Abnormal   Collection Time   01/03/12 12:27 PM      Component Value Range   Glucose-Capillary 179 (*) 70 - 99 (mg/dL)    Disposition: Home   Follow-up Appts: Discharge Orders    Future Orders Please Complete By Expires   Diet - low sodium heart  healthy      Discharge instructions      Comments:   Follow up with your cardiologist in one week.   Activity as tolerated - No restrictions           Tests Needing Follow-up:  BP AT HOME Time spent in discharge (includes decision making & examination of pt): 62 minutes  Signed: Asuna Peth 01/03/2012, 2:46 PM

## 2012-01-08 NOTE — ED Notes (Signed)
Patient arrived via EMS with c/o resp distress, +edema bilaterally

## 2012-01-08 NOTE — H&P (Signed)
  [name] is an 51 y.o. female.  HPI: 51 yr old female with hx of HTN, type 2 DM, PVD, anemia, CAD, HPTH admitted 1 wk ago with CHF, now with fairly rapid onset SOB this pm.  Admitted xs fluid intake.   Now on BIPA, and ^ BP.  No CP, cough, fevers or chills  Dialyzes at [facility] on TTS . Primary Nephrologist [place]..  Past Medical History  Diagnosis Date  . Hypertension   . Anemia   . Systolic CHF, acute on chronic   . Coronary artery disease   . Peripheral vascular disease   . Dialysis patient 01/02/12    [name]; [place]; Thurs; Sat.  . CHF (congestive heart failure)   . Angina   . Shortness of breath 01/02/12    "@rest ; lying down; w/exertion"  . Type II diabetes mellitus   . Blood transfusion   . End stage kidney disease     Allergies: No Known Allergies  Medications: {medication reviewed/display:[id number] Results for orders placed during the hospital encounter of 01/08/12 (from the past 48 hour(s))  GLUCOSE, CAPILLARY     Status: Abnormal   Collection Time   01/08/12  7:37 PM      Component Value Range Comment   Glucose-Capillary 389 (*) 70 - 99 (mg/dL)     Dg Chest Port 1 View  01/08/2012  *RADIOLOGY REPORT*  Clinical Data: Short of breath.  Difficulty breathing.  PORTABLE CHEST - 1 VIEW  Comparison: 01/03/2012.  Findings: Cardiomegaly remains present, more pronounced than on prior exam most similar to the prior study of 10/09/2011. Interstitial and alveolar pulmonary edema is present with Charyl Dancer B lines in the periphery.  Aortic arch atherosclerosis.  IMPRESSION: Moderate CHF.  Original Report Authenticated By: Andreas Newport, M.D.    Blood pressure 190/99, pulse 99, temperature 96.9 F (36.1 C), temperature source Axillary, resp. rate 20, SpO2 100.00%. General appearance: alert, moderate distress and SOB Head: Normocephalic, without obvious abnormality Eyes: conjunctivae/corneas clear. PERRL, EOM's intact. Fundi benign. Throat: lips, mucosa, and tongue  normal; teeth and gums normal Back: symmetric, no curvature. ROM normal. No CVA tenderness. Resp: diminished breath sounds bilaterally and rales bibasilar Cardio: regular rate and rhythm, S3 present and systolic murmur: holosystolic 2/6, blowing at apex GI: liver down 5 cm and tender Extremities: edema 1 plus, bilat ADA and AVG LUA B&T  Assessment/Plan: 1 Pulm edema from vol xs and stiff ventricle.  xs vol discussed 2 ESRD: as above Will dialyze and see if improves 3 HTN lower vol & meds 4 DM Monitor and instruct at D/C 5 PVD  P HD, wean BIPAP, D/C  Mellissa Conley L 01/08/2012, 8:37 PM

## 2012-01-08 NOTE — ED Notes (Signed)
1 SL NTG given per MD

## 2012-01-08 NOTE — Discharge Instructions (Signed)
Follow up at dialysis

## 2012-01-08 NOTE — ED Notes (Signed)
Left upper arm graft.  Left arm swollen but family states that it is always swollen.  Usually has dialysis on Tues/Thurs/Sat.  Was taking a shower this evening and became very SOB.  Left neck slightly swollen.  Family at bedside  IV team called to get access

## 2012-01-08 NOTE — ED Notes (Signed)
Dr Deterding called regarding bed assignment and was told that the patient was going to be discharged after dialysis

## 2012-01-08 NOTE — Discharge Summary (Signed)
  Physician Discharge Summary  Patient ID: [name] MRN: [id number] DOB/AGE: Jan 08, 1961 51 y.o.  Admit date: 01/08/2012 Discharge date: 01/08/2012  Admission Diagnoses:  Discharge Diagnoses:  Active Problems:  * No active hospital problems. *    Discharged Condition: stable  Hospital Course: Admitted for pulm edema and xs vol.  Underwent HD, and stabilized so D/C  Treatments: dialysis: Hemodialysis Blood pressure 181/91, pulse 89, temperature 96.9 F (36.1 C), temperature source Axillary, resp. rate 25, SpO2 100.00%.  Disposition: 01-Home or Self Care   Medication List  As of 01/08/2012  8:57 PM   ASK your doctor about these medications         amLODipine 10 MG tablet   Commonly known as: NORVASC   Take 10 mg by mouth at bedtime.      aspirin EC 81 MG tablet   Take 81 mg by mouth daily.      calcium acetate (Phos Binder) 667 MG/5ML Soln   Commonly known as: PHOSLYRA   Take 2,001 mg by mouth See admin instructions. 15ml with snacks      calcium acetate (Phos Binder) 667 MG/5ML Soln   Commonly known as: PHOSLYRA   Take 3,335 mg by mouth 3 (three) times daily with meals.      cinacalcet 60 MG tablet   Commonly known as: SENSIPAR   Take 60 mg by mouth daily.      gabapentin 100 MG capsule   Commonly known as: NEURONTIN   Take 100 mg by mouth at bedtime.      insulin aspart 100 UNIT/ML injection   Commonly known as: novoLOG   Inject 1-9 Units into the skin 3 (three) times daily before meals. Sliding scale      insulin glargine 100 UNIT/ML injection   Commonly known as: LANTUS   Inject 27 Units into the skin at bedtime.      lisinopril 40 MG tablet   Commonly known as: PRINIVIL,ZESTRIL   Take 40 mg by mouth at bedtime.      losartan 50 MG tablet   Commonly known as: COZAAR   Take 50 mg by mouth at bedtime.      metoprolol 50 MG tablet   Commonly known as: LOPRESSOR   Take 50 mg by mouth 2 (two) times daily.      multivitamin Tabs tablet   Take 1  tablet by mouth daily.      ondansetron 4 MG tablet   Commonly known as: ZOFRAN   Take 4 mg by mouth every 6 (six) hours as needed. For nausea.      pantoprazole 40 MG tablet   Commonly known as: PROTONIX   Take 40 mg by mouth at bedtime as needed. For acid reflux             Signed: Lawarence Meek L 01/08/2012, 8:57 PM

## 2012-01-08 NOTE — ED Notes (Signed)
Dr.Sheldon to eval ecg.

## 2012-01-08 NOTE — Procedures (Signed)
I was present at this session.  I have reviewed the session itself and made appropriate changes.  Eval during tx to see if can D/c  Latacha Texeira L 5/6/201310:05 PM

## 2012-01-09 ENCOUNTER — Emergency Department (HOSPITAL_COMMUNITY): Payer: Medicare Other

## 2012-01-09 LAB — POCT I-STAT, CHEM 8
BUN: 50 mg/dL — ABNORMAL HIGH (ref 6–23)
Calcium, Ion: <0.25 mmol/L — CL (ref 1.12–1.32)
HCT: 22 % — ABNORMAL LOW (ref 36.0–46.0)
Hemoglobin: 7.5 g/dL — ABNORMAL LOW (ref 12.0–15.0)
Sodium: 136 mEq/L (ref 135–145)
TCO2: 14 mmol/L (ref 0–100)

## 2012-01-09 LAB — POCT I-STAT TROPONIN I: Troponin i, poc: 0.13 ng/mL (ref 0.00–0.08)

## 2012-01-09 MED ORDER — ACETAMINOPHEN 325 MG PO TABS
ORAL_TABLET | ORAL | Status: AC
  Administered 2012-01-09: 650 mg
  Filled 2012-01-09: qty 2

## 2012-01-09 NOTE — Progress Notes (Signed)
Discharged from hemodialysis to home post HD tx. Weaned to RA during tx with fluid removal. 02 sats 100% upon d/c.  Pt educated on fluid and diet restrictions and became tearful saying that she did not drink or eat anything much and she doesn't know what got her in this condition. Pt was stable at time of d/c, sent with family per wheelchair.

## 2012-02-20 ENCOUNTER — Encounter: Payer: Self-pay | Admitting: Internal Medicine

## 2012-02-21 ENCOUNTER — Institutional Professional Consult (permissible substitution): Payer: Medicare Other | Admitting: Internal Medicine

## 2012-05-01 ENCOUNTER — Ambulatory Visit (INDEPENDENT_AMBULATORY_CARE_PROVIDER_SITE_OTHER): Payer: Medicare Other | Admitting: Internal Medicine

## 2012-05-01 ENCOUNTER — Encounter: Payer: Self-pay | Admitting: Internal Medicine

## 2012-05-01 VITALS — BP 158/90 | HR 87 | Temp 97.0°F

## 2012-05-01 DIAGNOSIS — R059 Cough, unspecified: Secondary | ICD-10-CM

## 2012-05-01 DIAGNOSIS — R05 Cough: Secondary | ICD-10-CM

## 2012-05-01 DIAGNOSIS — I10 Essential (primary) hypertension: Secondary | ICD-10-CM

## 2012-05-01 MED ORDER — FAMOTIDINE 20 MG PO TABS: ORAL_TABLET | ORAL | Status: DC

## 2012-05-01 MED ORDER — OLMESARTAN MEDOXOMIL 40 MG PO TABS: 40.0000 mg | ORAL_TABLET | Freq: Every day | ORAL | Status: DC

## 2012-05-01 NOTE — Patient Instructions (Addendum)
You will need off prinivil for at least a month before returning clinic.  It is the leading suspect as a cause of your cough.  Stop cozaar and start benicar 40 mg one daily in its place until you see Dr Lacy Duverney  Dexilant Take 30-60 min before first meal of the day and pepcid 20 mg one at bedtime as long as coughing at all.  GERD (REFLUX)  is an extremely common cause of respiratory symptoms, many times with no significant heartburn at all.    It can be treated with medication, but also with lifestyle changes including avoidance of late meals, excessive alcohol, smoking cessation, and avoid fatty foods, chocolate, peppermint, colas, red wine, and acidic juices such as orange juice.  NO MINT OR MENTHOL PRODUCTS SO NO COUGH DROPS  USE SUGARLESS CANDY INSTEAD (jolley ranchers or Stover's)  NO OIL BASED VITAMINS - use powdered substitutes.

## 2012-05-01 NOTE — Progress Notes (Signed)
  Subjective:    Patient ID: [name], female    DOB: May 11, 1961 MRN: [id number]  HPI  69 yobf never smoker or h/o allergies or asthma with onset of respiratory failure in setting of vol overload/ kidney failure and referred 05/01/2012 to pulmonary clinic by Dr Lacy Duverney with refractory cough.  05/01/2012 1st pulmonary ov on acei cc new cough x 5 months occurs daily s any pattern except always seems better if candy in mouth, also wakes her up, dry, choking.  No active sinus problems or hb on dexilant at hs. Never produces any mucus. Not sob unless choking. Mild dysphagia but not h/o aspiration or coughing up food or worse with meals.   Alsoo denies any obvious fluctuation of symptoms with weather or environmental changes or other aggravating or alleviating factors except as outlined above      Review of Systems  Constitutional: Negative for fever, chills and unexpected weight change.  HENT: Negative for ear pain, nosebleeds, congestion, sore throat, rhinorrhea, sneezing, trouble swallowing, dental problem, voice change, postnasal drip and sinus pressure.   Eyes: Negative for visual disturbance.  Respiratory: Positive for cough and choking. Negative for shortness of breath.   Cardiovascular: Negative for chest pain and leg swelling.  Gastrointestinal: Negative for vomiting, abdominal pain and diarrhea.  Genitourinary: Negative for difficulty urinating.  Musculoskeletal: Negative for arthralgias.  Skin: Negative for rash.  Neurological: Negative for tremors, syncope and headaches.  Hematological: Does not bruise/bleed easily.       Objective:   Physical Exam  W/c bound hoarse bf with honking upper airway cough HEENT: nl dentition, turbinates, and orophanx. Nl external ear canals without cough reflex   NECK :  without JVD/Nodes/TM/ nl carotid upstrokes bilaterally   LUNGS: no acc muscle use, clear to A and P bilaterally without cough on insp or exp maneuvers   CV:  RRR  no  s3 or murmur or increase in P2, no edema   ABD:  soft and nontender with nl excursion in the supine position. No bruits or organomegaly, bowel sounds nl  MS:  warm without deformities, calf tenderness, cyanosis or clubbing s/p bilateral aka  SKIN: warm and dry without lesions    NEURO:  alert, approp, no deficits      01/11/12 cxr Moderate CHF.     Assessment & Plan:

## 2012-05-03 DIAGNOSIS — R05 Cough: Secondary | ICD-10-CM | POA: Insufficient documentation

## 2012-05-03 NOTE — Assessment & Plan Note (Signed)
The most common causes of chronic cough in immunocompetent adults include the following: upper airway cough syndrome (UACS), previously referred to as postnasal drip syndrome (PNDS), which is caused by variety of rhinosinus conditions; (2) asthma; (3) GERD; (4) chronic bronchitis from cigarette smoking or other inhaled environmental irritants; (5) nonasthmatic eosinophilic bronchitis; and (6) bronchiectasis.   These conditions, singly or in combination, have accounted for up to 94% of the causes of chronic cough in prospective studies.   Other conditions have constituted no >6% of the causes in prospective studies These have included bronchogenic carcinoma, chronic interstitial pneumonia, sarcoidosis, left ventricular failure, ACEI-induced cough, and aspiration from a condition associated with pharyngeal dysfunction.   Chronic cough is often simultaneously caused by more than one condition. A single cause has been found from 38 to 82% of the time, multiple causes from 18 to 62%. Multiply caused cough has been the result of three diseases up to 42% of the time.   This is most likely  Classic Upper airway cough syndrome, so named because it's frequently impossible to sort out how much is  CR/sinusitis with freq throat clearing (which can be related to primary GERD)   vs  causing  secondary (" extra esophageal")  GERD from wide swings in gastric pressure that occur with throat clearing, often  promoting self use of mint and menthol lozenges that reduce the lower esophageal sphincter tone and exacerbate the problem further in a cyclical fashion.   These are the same pts (now being labeled as having "irritable larynx syndrome" by some cough centers) who not infrequently have a history of having failed to tolerate ace inhibitors,  dry powder inhalers or biphosphonates or report having atypical reflux symptoms that don't respond to standard doses of PPI , and are easily confused as having aecopd or asthma flares  by even experienced allergists/ pulmonologists.   For now best option is therefore to rx as gerd and stop acei for at least a month and then regroup if not better

## 2012-05-03 NOTE — Assessment & Plan Note (Signed)
ACE inhibitors are problematic in  pts with airway complaints because  even experienced pulmonologists can't always distinguish ace effects from copd/asthma.  By themselves they don't actually cause a problem, much like oxygen can't by itself start a fire, but they certainly serve as a powerful catalyst or enhancer for any "fire"  or inflammatory process in the upper airway, be it caused by an ET  tube or more commonly reflux (especially in the obese or pts with known GERD or who are on biphoshonates).    In the era of ARB near equivalency until we have a better handle on the reversibility of the airway problem, it just makes sense to avoid ACEI  entirely in the short run and then decide later, having established a level of airway control using a reasonable limited regimen, whether to add back ace but even then being very careful to observe the pt for worsening airway control and number of meds used/ needed to control symptoms.    If not controlled on benicar 40 would strongly rec change norvasc to minoxidil with intravasc vol control per HD per nephrology

## 2012-08-04 DIAGNOSIS — J189 Pneumonia, unspecified organism: Secondary | ICD-10-CM

## 2012-08-04 HISTORY — DX: Pneumonia, unspecified organism: J18.9

## 2012-08-12 ENCOUNTER — Encounter (HOSPITAL_COMMUNITY): Payer: Self-pay | Admitting: Emergency Medicine

## 2012-08-12 ENCOUNTER — Inpatient Hospital Stay (HOSPITAL_COMMUNITY)
Admission: EM | Admit: 2012-08-12 | Discharge: 2012-08-14 | DRG: 377 | Disposition: A | Discharge status: Home / Self Care | Payer: Medicare Other | Attending: Family Medicine | Admitting: Family Medicine

## 2012-08-12 ENCOUNTER — Emergency Department (HOSPITAL_COMMUNITY): Payer: Medicare Other

## 2012-08-12 ENCOUNTER — Encounter (HOSPITAL_COMMUNITY)
Admission: EM | Disposition: A | Discharge status: Home / Self Care | Payer: Self-pay | Source: Home / Self Care | Attending: Internal Medicine

## 2012-08-12 DIAGNOSIS — R05 Cough: Secondary | ICD-10-CM

## 2012-08-12 DIAGNOSIS — D631 Anemia in chronic kidney disease: Secondary | ICD-10-CM | POA: Diagnosis present

## 2012-08-12 DIAGNOSIS — D62 Acute posthemorrhagic anemia: Secondary | ICD-10-CM | POA: Diagnosis present

## 2012-08-12 DIAGNOSIS — N2581 Secondary hyperparathyroidism of renal origin: Secondary | ICD-10-CM | POA: Diagnosis present

## 2012-08-12 DIAGNOSIS — I739 Peripheral vascular disease, unspecified: Secondary | ICD-10-CM

## 2012-08-12 DIAGNOSIS — I501 Left ventricular failure: Secondary | ICD-10-CM

## 2012-08-12 DIAGNOSIS — J9600 Acute respiratory failure, unspecified whether with hypoxia or hypercapnia: Secondary | ICD-10-CM

## 2012-08-12 DIAGNOSIS — E119 Type 2 diabetes mellitus without complications: Secondary | ICD-10-CM | POA: Diagnosis present

## 2012-08-12 DIAGNOSIS — N039 Chronic nephritic syndrome with unspecified morphologic changes: Secondary | ICD-10-CM | POA: Diagnosis present

## 2012-08-12 DIAGNOSIS — S88119A Complete traumatic amputation at level between knee and ankle, unspecified lower leg, initial encounter: Secondary | ICD-10-CM

## 2012-08-12 DIAGNOSIS — I509 Heart failure, unspecified: Secondary | ICD-10-CM | POA: Diagnosis present

## 2012-08-12 DIAGNOSIS — E1169 Type 2 diabetes mellitus with other specified complication: Secondary | ICD-10-CM | POA: Diagnosis present

## 2012-08-12 DIAGNOSIS — Z992 Dependence on renal dialysis: Secondary | ICD-10-CM

## 2012-08-12 DIAGNOSIS — J9601 Acute respiratory failure with hypoxia: Secondary | ICD-10-CM

## 2012-08-12 DIAGNOSIS — R059 Cough, unspecified: Secondary | ICD-10-CM

## 2012-08-12 DIAGNOSIS — Z794 Long term (current) use of insulin: Secondary | ICD-10-CM

## 2012-08-12 DIAGNOSIS — I2510 Atherosclerotic heart disease of native coronary artery without angina pectoris: Secondary | ICD-10-CM

## 2012-08-12 DIAGNOSIS — I10 Essential (primary) hypertension: Secondary | ICD-10-CM | POA: Diagnosis present

## 2012-08-12 DIAGNOSIS — D649 Anemia, unspecified: Secondary | ICD-10-CM

## 2012-08-12 DIAGNOSIS — Z79899 Other long term (current) drug therapy: Secondary | ICD-10-CM

## 2012-08-12 DIAGNOSIS — I5022 Chronic systolic (congestive) heart failure: Secondary | ICD-10-CM | POA: Diagnosis present

## 2012-08-12 DIAGNOSIS — R6889 Other general symptoms and signs: Secondary | ICD-10-CM

## 2012-08-12 DIAGNOSIS — G547 Phantom limb syndrome without pain: Secondary | ICD-10-CM | POA: Diagnosis present

## 2012-08-12 DIAGNOSIS — K922 Gastrointestinal hemorrhage, unspecified: Secondary | ICD-10-CM

## 2012-08-12 DIAGNOSIS — N186 End stage renal disease: Secondary | ICD-10-CM | POA: Diagnosis present

## 2012-08-12 DIAGNOSIS — K5731 Diverticulosis of large intestine without perforation or abscess with bleeding: Principal | ICD-10-CM | POA: Diagnosis present

## 2012-08-12 DIAGNOSIS — K625 Hemorrhage of anus and rectum: Secondary | ICD-10-CM

## 2012-08-12 DIAGNOSIS — I120 Hypertensive chronic kidney disease with stage 5 chronic kidney disease or end stage renal disease: Secondary | ICD-10-CM | POA: Diagnosis present

## 2012-08-12 HISTORY — DX: Gastrointestinal hemorrhage, unspecified: K92.2

## 2012-08-12 HISTORY — DX: Pure hypercholesterolemia, unspecified: E78.00

## 2012-08-12 HISTORY — DX: Dependence on renal dialysis: Z99.2

## 2012-08-12 HISTORY — PX: ESOPHAGOGASTRODUODENOSCOPY: SHX5428

## 2012-08-12 HISTORY — DX: End stage renal disease: N18.6

## 2012-08-12 HISTORY — DX: Acute embolism and thrombosis of unspecified deep veins of unspecified lower extremity: I82.409

## 2012-08-12 LAB — CBC WITH DIFFERENTIAL/PLATELET
Basophils Absolute: 0 10*3/uL (ref 0.0–0.1)
Eosinophils Absolute: 0.2 10*3/uL (ref 0.0–0.7)
Eosinophils Relative: 2 % (ref 0–5)
Lymphocytes Relative: 22 % (ref 12–46)
MCV: 84.6 fL (ref 78.0–100.0)
Neutrophils Relative %: 69 % (ref 43–77)
Platelets: 229 10*3/uL (ref 150–400)
RDW: 14.7 % (ref 11.5–15.5)
WBC: 6.9 10*3/uL (ref 4.0–10.5)

## 2012-08-12 LAB — COMPREHENSIVE METABOLIC PANEL
ALT: 7 U/L (ref 0–35)
AST: 12 U/L (ref 0–37)
CO2: 24 mEq/L (ref 19–32)
Calcium: 10 mg/dL (ref 8.4–10.5)
Potassium: 4 mEq/L (ref 3.5–5.1)
Sodium: 134 mEq/L — ABNORMAL LOW (ref 135–145)
Total Protein: 6.7 g/dL (ref 6.0–8.3)

## 2012-08-12 LAB — GLUCOSE, CAPILLARY
Glucose-Capillary: 152 mg/dL — ABNORMAL HIGH (ref 70–99)
Glucose-Capillary: 94 mg/dL (ref 70–99)

## 2012-08-12 LAB — PHOSPHORUS: Phosphorus: 7.6 mg/dL — ABNORMAL HIGH (ref 2.3–4.6)

## 2012-08-12 LAB — HEMOGLOBIN A1C
Hgb A1c MFr Bld: 9.1 % — ABNORMAL HIGH (ref <5.7)
Mean Plasma Glucose: 214 mg/dL — ABNORMAL HIGH (ref <117)

## 2012-08-12 SURGERY — EGD (ESOPHAGOGASTRODUODENOSCOPY)
Anesthesia: Moderate Sedation

## 2012-08-12 MED ORDER — ASPIRIN EC 81 MG PO TBEC
81.0000 mg | DELAYED_RELEASE_TABLET | Freq: Every day | ORAL | Status: DC
  Administered 2012-08-14: 81 mg via ORAL
  Filled 2012-08-12 (×4): qty 1

## 2012-08-12 MED ORDER — CALCIUM ACETATE 667 MG PO CAPS
1334.0000 mg | ORAL_CAPSULE | Freq: Three times a day (TID) | ORAL | Status: DC
  Filled 2012-08-12 (×9): qty 2

## 2012-08-12 MED ORDER — FENTANYL CITRATE 0.05 MG/ML IJ SOLN
INTRAMUSCULAR | Status: AC
  Filled 2012-08-12: qty 4

## 2012-08-12 MED ORDER — PARICALCITOL 5 MCG/ML IV SOLN
18.0000 ug | INTRAVENOUS | Status: DC
  Administered 2012-08-13: 18 ug via INTRAVENOUS
  Filled 2012-08-12: qty 3.6

## 2012-08-12 MED ORDER — SODIUM CHLORIDE 0.9 % IV SOLN: INTRAVENOUS | Status: DC

## 2012-08-12 MED ORDER — PEG 3350-KCL-NA BICARB-NACL 420 G PO SOLR
4000.0000 mL | Freq: Once | ORAL | Status: AC
  Administered 2012-08-12: 4000 mL via ORAL
  Filled 2012-08-12: qty 4000

## 2012-08-12 MED ORDER — MIDAZOLAM HCL 5 MG/ML IJ SOLN
INTRAMUSCULAR | Status: AC
  Filled 2012-08-12: qty 2

## 2012-08-12 MED ORDER — ALTEPLASE 2 MG IJ SOLR
2.0000 mg | Freq: Once | INTRAMUSCULAR | Status: AC | PRN
  Filled 2012-08-12: qty 2

## 2012-08-12 MED ORDER — INSULIN GLARGINE 100 UNIT/ML ~~LOC~~ SOLN
36.0000 [IU] | Freq: Every day | SUBCUTANEOUS | Status: DC
  Administered 2012-08-12 – 2012-08-13 (×2): 36 [IU] via SUBCUTANEOUS

## 2012-08-12 MED ORDER — ONDANSETRON HCL 4 MG/2ML IJ SOLN: 4.0000 mg | Freq: Four times a day (QID) | INTRAMUSCULAR | Status: DC | PRN

## 2012-08-12 MED ORDER — FAMOTIDINE 20 MG PO TABS
20.0000 mg | ORAL_TABLET | Freq: Every day | ORAL | Status: DC
  Administered 2012-08-13: 20 mg via ORAL
  Filled 2012-08-12 (×4): qty 1

## 2012-08-12 MED ORDER — LIDOCAINE-PRILOCAINE 2.5-2.5 % EX CREA: 1.0000 "application " | TOPICAL_CREAM | CUTANEOUS | Status: DC | PRN

## 2012-08-12 MED ORDER — CINACALCET HCL 30 MG PO TABS
120.0000 mg | ORAL_TABLET | Freq: Every day | ORAL | Status: DC
  Administered 2012-08-14: 120 mg via ORAL
  Filled 2012-08-12 (×3): qty 4

## 2012-08-12 MED ORDER — PENTAFLUOROPROP-TETRAFLUOROETH EX AERO: 1.0000 "application " | INHALATION_SPRAY | CUTANEOUS | Status: DC | PRN

## 2012-08-12 MED ORDER — BUTAMBEN-TETRACAINE-BENZOCAINE 2-2-14 % EX AERO
INHALATION_SPRAY | CUTANEOUS | Status: DC | PRN
  Administered 2012-08-12: 2 via TOPICAL

## 2012-08-12 MED ORDER — CINACALCET HCL 30 MG PO TABS
60.0000 mg | ORAL_TABLET | Freq: Every day | ORAL | Status: DC
  Filled 2012-08-12 (×2): qty 2

## 2012-08-12 MED ORDER — DIPHENHYDRAMINE HCL 50 MG/ML IJ SOLN
INTRAMUSCULAR | Status: AC
  Filled 2012-08-12: qty 1

## 2012-08-12 MED ORDER — SODIUM CHLORIDE 0.9 % IV SOLN: 100.0000 mL | INTRAVENOUS | Status: DC | PRN

## 2012-08-12 MED ORDER — ONDANSETRON HCL 4 MG PO TABS: 4.0000 mg | ORAL_TABLET | Freq: Four times a day (QID) | ORAL | Status: DC | PRN

## 2012-08-12 MED ORDER — FENTANYL CITRATE 0.05 MG/ML IJ SOLN
INTRAMUSCULAR | Status: DC | PRN
  Administered 2012-08-12: 25 ug via INTRAVENOUS

## 2012-08-12 MED ORDER — NEPRO/CARBSTEADY PO LIQD: 237.0000 mL | ORAL | Status: DC | PRN

## 2012-08-12 MED ORDER — AMLODIPINE BESYLATE 10 MG PO TABS
10.0000 mg | ORAL_TABLET | Freq: Every day | ORAL | Status: DC
  Administered 2012-08-13: 10 mg via ORAL
  Filled 2012-08-12 (×4): qty 1

## 2012-08-12 MED ORDER — HEPARIN SODIUM (PORCINE) 1000 UNIT/ML DIALYSIS
1000.0000 [IU] | INTRAMUSCULAR | Status: DC | PRN
  Filled 2012-08-12: qty 1

## 2012-08-12 MED ORDER — LOSARTAN POTASSIUM 50 MG PO TABS
50.0000 mg | ORAL_TABLET | Freq: Every day | ORAL | Status: DC
  Administered 2012-08-13: 50 mg via ORAL
  Filled 2012-08-12 (×4): qty 1

## 2012-08-12 MED ORDER — MIDAZOLAM HCL 10 MG/2ML IJ SOLN
INTRAMUSCULAR | Status: DC | PRN
  Administered 2012-08-12: 2 mg via INTRAVENOUS

## 2012-08-12 MED ORDER — SODIUM CHLORIDE 0.9 % IV SOLN
INTRAVENOUS | Status: DC
  Administered 2012-08-12: 12:00:00 via INTRAVENOUS

## 2012-08-12 MED ORDER — IRBESARTAN 75 MG PO TABS: 75.0000 mg | ORAL_TABLET | Freq: Every day | ORAL | Status: DC

## 2012-08-12 MED ORDER — HYDROCODONE-ACETAMINOPHEN 5-325 MG PO TABS: 1.0000 | ORAL_TABLET | ORAL | Status: DC | PRN

## 2012-08-12 MED ORDER — LIDOCAINE HCL (PF) 1 % IJ SOLN: 5.0000 mL | INTRAMUSCULAR | Status: DC | PRN

## 2012-08-12 MED ORDER — INSULIN ASPART 100 UNIT/ML ~~LOC~~ SOLN: 1.0000 [IU] | Freq: Three times a day (TID) | SUBCUTANEOUS | Status: DC

## 2012-08-12 MED ORDER — SODIUM CHLORIDE 0.9 % IJ SOLN
3.0000 mL | Freq: Two times a day (BID) | INTRAMUSCULAR | Status: DC
  Administered 2012-08-12 – 2012-08-14 (×3): 3 mL via INTRAVENOUS

## 2012-08-12 MED ORDER — CINACALCET HCL 30 MG PO TABS: 60.0000 mg | ORAL_TABLET | Freq: Every day | ORAL | Status: DC

## 2012-08-12 MED ORDER — MORPHINE SULFATE 2 MG/ML IJ SOLN: 1.0000 mg | INTRAMUSCULAR | Status: DC | PRN

## 2012-08-12 NOTE — ED Provider Notes (Signed)
History     CSN: [id number]  Arrival date & time 08/12/12  0208   First MD Initiated Contact with Patient 08/12/12 0214      Chief Complaint  Patient presents with  . Rectal Bleeding    (Consider location/radiation/quality/duration/timing/severity/associated sxs/prior treatment) Patient is a 51 y.o. female presenting with hematochezia. The history is provided by the patient (the pt states she had 4 episodes of rectal bleeding today). No language interpreter was used.  Rectal Bleeding  The current episode started today. The problem occurs frequently. The problem has been resolved. The pain is mild. The stool is described as soft. There was no prior successful therapy. There was no prior unsuccessful therapy. Pertinent negatives include no abdominal pain, no diarrhea, no hematuria, no chest pain, no headaches, no coughing and no rash.    Past Medical History  Diagnosis Date  . Hypertension   . Anemia   . Systolic CHF, acute on chronic   . Coronary artery disease   . Peripheral vascular disease   . Dialysis patient 01/02/12    [name]; [place]; Thurs; Sat.  . CHF (congestive heart failure)   . Angina   . Shortness of breath 01/02/12    "@rest ; lying down; w/exertion"  . Type II diabetes mellitus   . Blood transfusion   . End stage kidney disease     Past Surgical History  Procedure Date  . Cardiac catheterization   . 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 Av fistula placement ~ 2003    left upper arm  . Abdominal hysterectomy 1990's  . Arteriovenous graft placement 2011    left upper arm  . Vascular surgery     Family History  Problem Relation Age of Onset  . Breast cancer Sister   . Breast cancer Sister     History  Substance Use Topics  . Smoking status: Never Smoker   . Smokeless tobacco: Never Used  . Alcohol Use: No    OB History    Grav Para Term Preterm Abortions TAB SAB Ect Mult Living            Review of Systems  Constitutional: Negative for fatigue.  HENT: Negative for congestion, sinus pressure and ear discharge.   Eyes: Negative for discharge.  Respiratory: Negative for cough.   Cardiovascular: Negative for chest pain.  Gastrointestinal: Positive for hematochezia. Negative for abdominal pain and diarrhea.       Rectal bleeding  Genitourinary: Negative for frequency and hematuria.  Musculoskeletal: Negative for back pain.  Skin: Negative for rash.  Neurological: Negative for seizures and headaches.  Hematological: Negative.   Psychiatric/Behavioral: Negative for hallucinations.    Allergies  Review of patient's allergies indicates no known allergies.  Home Medications   Current Outpatient Rx  Name  Route  Sig  Dispense  Refill  . AMLODIPINE BESYLATE 10 MG PO TABS   Oral   Take 10 mg by mouth at bedtime.          . ASPIRIN EC 81 MG PO TBEC   Oral   Take 81 mg by mouth daily.         Marland Kitchen CALCIUM ACETATE 667 MG PO CAPS   Oral   Take 1,334 mg by mouth 3 (three) times daily with meals.         Marland Kitchen CINACALCET HCL 60 MG PO TABS   Oral   Take  60 mg by mouth daily.         Marland Kitchen FAMOTIDINE 20 MG PO TABS   Oral   Take 20 mg by mouth at bedtime. One at bedtime         . INSULIN ASPART 100 UNIT/ML [place] SOLN   Subcutaneous   Inject 1-5 Units into the skin 3 (three) times daily before meals. Sliding scale         . INSULIN GLARGINE 100 UNIT/ML [place] SOLN   Subcutaneous   Inject 36 Units into the skin at bedtime.          Marland Kitchen LOSARTAN POTASSIUM 50 MG PO TABS   Oral   Take 50 mg by mouth at bedtime.         Marland Kitchen OLMESARTAN MEDOXOMIL 40 MG PO TABS   Oral   Take 1 tablet (40 mg total) by mouth daily.           BP 153/69  Pulse 95  Temp 98.2 F (36.8 C) (Oral)  Resp 19  SpO2 100%  Physical Exam  Constitutional: She is oriented to person, place, and time. She appears well-developed.  HENT:  Head: Normocephalic and atraumatic.  Eyes:  Conjunctivae normal and EOM are normal. No scleral icterus.  Neck: Neck supple. No thyromegaly present.  Cardiovascular: Normal rate and regular rhythm.  Exam reveals no gallop and no friction rub.   No murmur heard. Pulmonary/Chest: No stridor. She has no wheezes. She has no rales. She exhibits no tenderness.  Abdominal: She exhibits no distension. There is no tenderness. There is no rebound.  Genitourinary:       Hem pos maroon stool  Musculoskeletal: Normal range of motion. She exhibits no edema.  Lymphadenopathy:    She has no cervical adenopathy.  Neurological: She is oriented to person, place, and time. Coordination normal.  Skin: No rash noted. No erythema.  Psychiatric: She has a normal mood and affect. Her behavior is normal.    ED Course  Procedures (including critical care time)  Labs Reviewed  CBC WITH DIFFERENTIAL - Abnormal; Notable for the following:    RBC 2.72 (*)     Hemoglobin 7.6 (*)     HCT 23.0 (*)     All other components within normal limits  COMPREHENSIVE METABOLIC PANEL - Abnormal; Notable for the following:    Sodium 134 (*)     Chloride 91 (*)     Glucose, Bld 255 (*)     BUN 61 (*)     Creatinine, Ser 9.54 (*)     Albumin 3.2 (*)     Total Bilirubin 0.2 (*)     GFR calc non Af Amer 4 (*)     GFR calc Af Amer 5 (*)     All other components within normal limits  OCCULT BLOOD, POC DEVICE   Ct Abdomen Pelvis Wo Contrast  08/12/2012  *RADIOLOGY REPORT*  Clinical Data: Bloody stools.  Hemodialysis patient.  CT ABDOMEN AND PELVIS WITHOUT CONTRAST  Technique:  Multidetector CT imaging of the abdomen and pelvis was performed following the standard protocol without intravenous contrast.  Comparison: None.  Findings: Linear atelectasis in the lung bases.  Mild cardiac enlargement.  Calcifications in the liver and spleen likely represent granulomas. A simple appearing cysts in the liver, largest measuring 2.1 cm diameter.  The liver, spleen, gallbladder,  pancreas, adrenal glands, and retroperitoneal lymph nodes are otherwise unremarkable. There is diffuse calcification of the abdominal aorta and branch vessels.  Significant calcification is present in the superior mesenteric artery.  Mesenteric stenosis is not excluded.  Bilateral renal atrophy with multiple renal cysts.  Multiple bilateral renal calcifications appear to be vascular calcifications.  No hydronephrosis.  The stomach, small bowel, and colon are not abnormally distended and no specific wall thickening is appreciated.  No free air or free fluid in the abdomen.  Pelvis:  The uterus appears surgically absent.  No abnormal adnexal masses.  Prominence of the wall of the rectosigmoid colon could represent bowel wall thickening such as inflammatory process or could represent incomplete distension or muscular hypertrophy.  No evidence of diverticulitis.  The appendix is normal.  No free or loculated pelvic fluid collections.  Normal alignment of lumbar vertebrae with mild degenerative change.  IMPRESSION: Extensive vascular calcifications.  Bilateral renal parenchymal atrophy and multiple cysts.  Nonspecific wall thickening of the rectosigmoid colon.   Original Report Authenticated By: Burman Nieves, M.D.      1. Rectal bleeding       MDM          Benny Lennert, MD 08/12/12 [phone]

## 2012-08-12 NOTE — Consult Note (Signed)
[facility] Reason for consult: Rectal bleeding Referring Physician: [facility]. PCP: Renal team. Primary GI: None patient is unassigned : [name] is an 51 y.o. female.  HPI: patient is been on hemodialysis for about 9 years. She felt fine yesterday and then went to the bathroom and had a bloody bowel movement. This was bright red and was not associated with constipation. She had no severe abdominal discomfort just intermittent cramps and she passed her bowel movements. She denies chronic constipation or diarrhea. She denies melena. She has no history of ulcers and has never had prior EGD or colonoscopy. Patient states that she has pain in her lower extremities. 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 For this reason, she frequently takes Aleve to control this pain usually after hemodialysis when the pain is worse. Denies family history of colon cancer or colon polyps.  Past Medical History  Diagnosis Date  . Hypertension   . Anemia   . Systolic CHF, acute on chronic   . Coronary artery disease   . Peripheral vascular disease   . Dialysis patient 01/02/12    [name]; [place]; Thurs; Sat.  . CHF (congestive heart failure)   . Angina   . Shortness of breath 01/02/12    "@rest ; lying down; w/exertion"  . Type II diabetes mellitus   . Blood transfusion   . End stage kidney disease     Past Surgical History  Procedure Date  . Cardiac catheterization   . 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 Av fistula placement ~ 2003    left upper arm  . Abdominal hysterectomy 1990's  . Arteriovenous graft placement 2011    left upper arm  . Vascular surgery     Family History  Problem Relation Age of Onset  . Breast cancer Sister   . Breast cancer Sister     Social History:  reports that she has never smoked. She  has never used smokeless tobacco. She reports that she does not drink alcohol or use illicit drugs.  Allergies: No Known Allergies  Medications;    . amLODipine  10 mg Oral QHS  . aspirin EC  81 mg Oral Daily  . calcium acetate  1,334 mg Oral TID WC  . cinacalcet  120 mg Oral Q breakfast  . famotidine  20 mg Oral QHS  . insulin aspart  1-5 Units Subcutaneous TID AC  . insulin glargine  36 Units Subcutaneous QHS  . losartan  50 mg Oral QHS  . paricalcitol  18 mcg Intravenous Q T,Th,Sa-HD  . sodium chloride  3 mL Intravenous Q12H  . [DISCONTINUED] cinacalcet  60 mg Oral Daily  . [DISCONTINUED] cinacalcet  60 mg Oral Q breakfast  . [DISCONTINUED] irbesartan  75 mg Oral Daily   PRN Meds HYDROcodone-acetaminophen, morphine injection, ondansetron (ZOFRAN) IV, ondansetron Results for orders placed during the hospital encounter of 08/12/12 (from the past 48 hour(s))  CBC WITH DIFFERENTIAL     Status: Abnormal   Collection Time   08/12/12  3:34 AM      Component Value Range Comment   WBC 6.9  4.0 - 10.5 K/uL    RBC 2.72 (*) 3.87 - 5.11 MIL/uL    Hemoglobin 7.6 (*) 12.0 - 15.0 g/dL    HCT 16.1 (*)  36.0 - 46.0 %    MCV 84.6  78.0 - 100.0 fL    MCH 27.9  26.0 - 34.0 pg    MCHC 33.0  30.0 - 36.0 g/dL    RDW 16.1  09.6 - 04.5 %    Platelets 229  150 - 400 K/uL    Neutrophils Relative 69  43 - 77 %    Neutro Abs 4.8  1.7 - 7.7 K/uL    Lymphocytes Relative 22  12 - 46 %    Lymphs Abs 1.5  0.7 - 4.0 K/uL    Monocytes Relative 6  3 - 12 %    Monocytes Absolute 0.4  0.1 - 1.0 K/uL    Eosinophils Relative 2  0 - 5 %    Eosinophils Absolute 0.2  0.0 - 0.7 K/uL    Basophils Relative 1  0 - 1 %    Basophils Absolute 0.0  0.0 - 0.1 K/uL   COMPREHENSIVE METABOLIC PANEL     Status: Abnormal   Collection Time   08/12/12  3:34 AM      Component Value Range Comment   Sodium 134 (*) 135 - 145 mEq/L    Potassium 4.0  3.5 - 5.1 mEq/L    Chloride 91 (*) 96 - 112 mEq/L    CO2 24  19 - 32 mEq/L     Glucose, Bld 255 (*) 70 - 99 mg/dL    BUN 61 (*) 6 - 23 mg/dL    Creatinine, Ser 4.09 (*) 0.50 - 1.10 mg/dL    Calcium 81.1  8.4 - 10.5 mg/dL    Total Protein 6.7  6.0 - 8.3 g/dL    Albumin 3.2 (*) 3.5 - 5.2 g/dL    AST 12  0 - 37 U/L    ALT 7  0 - 35 U/L    Alkaline Phosphatase 77  39 - 117 U/L    Total Bilirubin 0.2 (*) 0.3 - 1.2 mg/dL    GFR calc non Af Amer 4 (*) >90 mL/min    GFR calc Af Amer 5 (*) >90 mL/min   MAGNESIUM     Status: Normal   Collection Time   08/12/12  3:34 AM      Component Value Range Comment   Magnesium 2.3  1.5 - 2.5 mg/dL   PHOSPHORUS     Status: Abnormal   Collection Time   08/12/12  3:34 AM      Component Value Range Comment   Phosphorus 7.6 (*) 2.3 - 4.6 mg/dL   OCCULT BLOOD, POC DEVICE     Status: Normal   Collection Time   08/12/12  4:41 AM      Component Value Range Comment   Fecal Occult Bld POSITIVE     GLUCOSE, CAPILLARY     Status: Abnormal   Collection Time   08/12/12  7:40 AM      Component Value Range Comment   Glucose-Capillary 178 (*) 70 - 99 mg/dL    Comment 1 Documented in Chart     GLUCOSE, CAPILLARY     Status: Abnormal   Collection Time   08/12/12  8:33 AM      Component Value Range Comment   Glucose-Capillary 152 (*) 70 - 99 mg/dL    Comment 1 Documented in Chart      Comment 2 Notify RN       Ct Abdomen Pelvis Wo Contrast  08/12/2012  *RADIOLOGY REPORT*  Clinical Data: Bloody stools.  Hemodialysis patient.  CT ABDOMEN AND PELVIS WITHOUT CONTRAST  Technique:  Multidetector CT imaging of the abdomen and pelvis was performed following the standard protocol without intravenous contrast.  Comparison: None.  Findings: Linear atelectasis in the lung bases.  Mild cardiac enlargement.  Calcifications in the liver and spleen likely represent granulomas. A simple appearing cysts in the liver, largest measuring 2.1 cm diameter.  The liver, spleen, gallbladder, pancreas, adrenal glands, and retroperitoneal lymph nodes are otherwise  unremarkable. There is diffuse calcification of the abdominal aorta and branch vessels.  Significant calcification is present in the superior mesenteric artery.  Mesenteric stenosis is not excluded.  Bilateral renal atrophy with multiple renal cysts.  Multiple bilateral renal calcifications appear to be vascular calcifications.  No hydronephrosis.  The stomach, small bowel, and colon are not abnormally distended and no specific wall thickening is appreciated.  No free air or free fluid in the abdomen.  Pelvis:  The uterus appears surgically absent.  No abnormal adnexal masses.  Prominence of the wall of the rectosigmoid colon could represent bowel wall thickening such as inflammatory process or could represent incomplete distension or muscular hypertrophy.  No evidence of diverticulitis.  The appendix is normal.  No free or loculated pelvic fluid collections.  Normal alignment of lumbar vertebrae with mild degenerative change.  IMPRESSION: Extensive vascular calcifications.  Bilateral renal parenchymal atrophy and multiple cysts.  Nonspecific wall thickening of the rectosigmoid colon.   Original Report Authenticated By: Burman Nieves, M.D.    ROS: Constitutional: on hemodialysis chronically tired HEENT: negative Cardiovascular: history of coronary disease, history of peripheral vascular disease with previous lower extremity amputations Respiratory: no prior lung disease. GI: please see history of present illness GU: on chronic hemodialysis for end-stage renal disease. Musculoskeletal: severe family pain or lower extremities in spite of bilateral amputations Neuro/Psychiatric: Endocrine/Heme: diabetes metabolic syndrome            Blood pressure 147/67, pulse 88, temperature 97.7 F (36.5 C), temperature source Oral, resp. rate 18, SpO2 100.00%.  Physical exam:  General-alert African American female in no distress. She is oriented Eyes-sclerae are nonicteric Lungs-bibasilar  rales Heart-normal S1 and S2 without murmurs or gallops Abdomen-soft and nontender   Assessment: 1. GI bleed. This sounds to be lower GI bleed however, the patient has been taking Aleve for phantom limb pain and could well have an ulcer. Think we should start with an upper endoscopy and she probably will need a colonoscopy as well 2. End-stage renal disease on hemodialysis 3. Peripheral vascular disease status post bilateral lower amputations 4. phantom limb pain. Unfortunately, she's been taking Aleve to control his symptoms. 5. Metabolic syndrome. Has hypertension, coronary disease, peripheral vascular disease and diabetes  Plan: We'll proceed with EGD this morning to evaluate upper GI tract and probably colonoscopy tomorrow. Have discussed this in detail with the patient.   Davion Meara JR,Karmine Kauer L 08/12/2012, 10:53 AM

## 2012-08-12 NOTE — H&P (View-Only) (Signed)
Patient seen and examined. Admitted earlier today for rectal bleeding. She takes NSAIDS for phantom limb pain. Plan is to proceed with EGD today and possibly will need colonoscopy as well. Hb is 7.6 down from 10 on 11/26. WIll be transfused 2 units today. Will continue to follow.  Peggye Pitt, MD [facility] Pager: [phone]

## 2012-08-12 NOTE — ED Notes (Signed)
Given PO contrast for abd CT

## 2012-08-12 NOTE — Progress Notes (Signed)
Patient ID: [name], female   DOB: September 15, 1960, 51 y.o.   MRN: [id number]  [facility] History and Physical  Bethsaida Siegenthaler Vollmer WUJ:[id number] DOB: 09/18/1960 DOA: 08/12/2012  Referring physician: ED physician PCP: Cecille Aver, MD   Chief Complaint: Rectal bleed  HPI:  Pt is 51 yo female with ESRD on HD (MWF, follows with Dr. Kathrene Bongo) who presents to [facility] ED with main concern of 4 episodes of rectal bleeding that initially started one day prior to admission, described as bright red blood, associated with intermittent episodes of generalized abdominal discomfort. Pt denies similar events in the past, denies fevers, chills, other systemic symptoms. She reports compliance with medications and with HD, tolerating HD well.  Assessment and Plan:  Principal Problem:  *Rectal bleed - unclear etiology at this time, FOBT checked in ED and was positive  - Hg drop from baseline 9- 10 - I do not see any endoscopy or colonoscopy in EPIC - renal team has been consulted and will ask them to transfuse with HD today Active Problems:  CKD (chronic kidney disease) requiring chronic dialysis - Renal team notified, HD MWF - plan on transfusion with HD today  HTN (hypertension)-accelerated/uncontrolled at admission - slightly above the target goal but better compared to previous admissions - will continue home medications: COzaar, Benicar, Norvasc  DM (diabetes mellitus), uncontrolled with complications neuropathy  - uncontrolled, last A1C 03/2012 > 11 - continue Insulin Lantus and readjust the regimen as indicated - start SSI  Anemia of chronic disease and now of acute blood loss - plan on transfusion - will ask primary team to obtain GI consultation for further recommendations  Code Status: Full Family Communication: Pt at bedside Disposition Plan: Admit to telemetry unit   Review of Systems:  Constitutional: Negative for fever, chills and malaise/fatigue. Negative for  diaphoresis.  HENT: Negative for hearing loss, ear pain, nosebleeds, congestion, sore throat, neck pain, tinnitus and ear discharge.   Eyes: Negative for blurred vision, double vision, photophobia, pain, discharge and redness.  Respiratory: Negative for cough, hemoptysis, sputum production, shortness of breath, wheezing and stridor.   Cardiovascular: Negative for chest pain, palpitations, orthopnea, claudication and leg swelling.  Gastrointestinal: Negative for nausea, vomiting and abdominal pain. Negative for heartburn, constipation, blood in stool + Genitourinary: Negative for dysuria, urgency, frequency, hematuria and flank pain.  Musculoskeletal: Negative for myalgias, back pain, joint pain and falls.  Skin: Negative for itching and rash.  Neurological: Negative for dizziness and weakness. Negative for tingling, tremors, sensory change, speech change, focal weakness, loss of consciousness and headaches.  Endo/Heme/Allergies: Negative for environmental allergies and polydipsia. Does not bruise/bleed easily.  Psychiatric/Behavioral: Negative for suicidal ideas. The patient is not nervous/anxious.      Past Medical History  Diagnosis Date  . Hypertension   . Anemia   . Systolic CHF, acute on chronic   . Coronary artery disease   . Peripheral vascular disease   . Dialysis patient 01/02/12    [name]; [place]; Thurs; Sat.  . CHF (congestive heart failure)   . Angina   . Shortness of breath 01/02/12    "@rest ; lying down; w/exertion"  . Type II diabetes mellitus   . Blood transfusion   . End stage kidney disease     Past Surgical History  Procedure Date  . Cardiac catheterization   . 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 Av fistula placement ~  2003    left upper arm  . Abdominal hysterectomy 1990's  . Arteriovenous graft placement 2011    left upper arm  . Vascular surgery     Social History:  reports that she has never  smoked. She has never used smokeless tobacco. She reports that she does not drink alcohol or use illicit drugs.  No Known Allergies  Family History  Problem Relation Age of Onset  . Breast cancer Sister   . Breast cancer Sister     Prior to Admission medications   Medication Sig Start Date End Date Taking? Authorizing Provider  amLODipine (NORVASC) 10 MG tablet Take 10 mg by mouth at bedtime.    Yes Historical Provider, MD  aspirin EC 81 MG tablet Take 81 mg by mouth daily.   Yes Historical Provider, MD  calcium acetate (PHOSLO) 667 MG capsule Take 1,334 mg by mouth 3 (three) times daily with meals.   Yes Historical Provider, MD  cinacalcet (SENSIPAR) 60 MG tablet Take 60 mg by mouth daily.   Yes Historical Provider, MD  famotidine (PEPCID) 20 MG tablet Take 20 mg by mouth at bedtime. One at bedtime 05/01/12 05/01/13 Yes Nyoka Cowden, MD  insulin aspart (NOVOLOG) 100 UNIT/ML injection Inject 1-5 Units into the skin 3 (three) times daily before meals. Sliding scale   Yes Historical Provider, MD  insulin glargine (LANTUS) 100 UNIT/ML injection Inject 36 Units into the skin at bedtime.    Yes Historical Provider, MD  losartan (COZAAR) 50 MG tablet Take 50 mg by mouth at bedtime.   Yes Historical Provider, MD  olmesartan (BENICAR) 40 MG tablet Take 1 tablet (40 mg total) by mouth daily. 05/01/12 05/01/13 Yes Nyoka Cowden, MD    Physical Exam: Filed Vitals:   08/12/12 0213 08/12/12 0300 08/12/12 0414  BP: 150/72  153/69  Pulse:  94 95  Temp: 98.2 F (36.8 C)    TempSrc: Oral    Resp: 18 24 19   SpO2: 97% 96% 100%    Physical Exam  Constitutional: Appears well-developed and well-nourished. No distress.  HENT: Normocephalic. External right and left ear normal. Oropharynx is clear and moist.  Eyes: Conjunctivae and EOM are normal. PERRLA, no scleral icterus.  Neck: Normal ROM. Neck supple. No JVD. No tracheal deviation. No thyromegaly.  CVS: RRR, S1/S2 +, no murmurs, no gallops, no  carotid bruit.  Pulmonary: Effort and breath sounds normal, bibasilar crackles  Abdominal: Soft. BS +,  no distension, tenderness, rebound or guarding.  Musculoskeletal: Normal range of motion. +1 bilateral pitting edema and no tenderness.  Lymphadenopathy: No lymphadenopathy noted, cervical, inguinal. Neuro: Alert. Normal reflexes, muscle tone coordination. No cranial nerve deficit. Skin: Skin is warm and dry. No rash noted. Not diaphoretic. No erythema. No pallor.  Psychiatric: Normal mood and affect. Behavior, judgment, thought content normal.   Labs on Admission:  Basic Metabolic Panel:  Lab 08/12/12 3086  NA 134*  K 4.0  CL 91*  CO2 24  GLUCOSE 255*  BUN 61*  CREATININE 9.54*  CALCIUM 10.0  MG --  PHOS --   Liver Function Tests:  Lab 08/12/12 0334  AST 12  ALT 7  ALKPHOS 77  BILITOT 0.2*  PROT 6.7  ALBUMIN 3.2*   CBC:  Lab 08/12/12 0334  WBC 6.9  NEUTROABS 4.8  HGB 7.6*  HCT 23.0*  MCV 84.6  PLT 229    Radiological Exams on Admission: Ct Abdomen Pelvis Wo Contrast  08/12/2012  *RADIOLOGY REPORT*  Clinical  Data: Bloody stools.  Hemodialysis patient.  CT ABDOMEN AND PELVIS WITHOUT CONTRAST  Technique:  Multidetector CT imaging of the abdomen and pelvis was performed following the standard protocol without intravenous contrast.  Comparison: None.  Findings: Linear atelectasis in the lung bases.  Mild cardiac enlargement.  Calcifications in the liver and spleen likely represent granulomas. A simple appearing cysts in the liver, largest measuring 2.1 cm diameter.  The liver, spleen, gallbladder, pancreas, adrenal glands, and retroperitoneal lymph nodes are otherwise unremarkable. There is diffuse calcification of the abdominal aorta and branch vessels.  Significant calcification is present in the superior mesenteric artery.  Mesenteric stenosis is not excluded.  Bilateral renal atrophy with multiple renal cysts.  Multiple bilateral renal calcifications appear to be  vascular calcifications.  No hydronephrosis.  The stomach, small bowel, and colon are not abnormally distended and no specific wall thickening is appreciated.  No free air or free fluid in the abdomen.  Pelvis:  The uterus appears surgically absent.  No abnormal adnexal masses.  Prominence of the wall of the rectosigmoid colon could represent bowel wall thickening such as inflammatory process or could represent incomplete distension or muscular hypertrophy.  No evidence of diverticulitis.  The appendix is normal.  No free or loculated pelvic fluid collections.  Normal alignment of lumbar vertebrae with mild degenerative change.  IMPRESSION: Extensive vascular calcifications.  Bilateral renal parenchymal atrophy and multiple cysts.  Nonspecific wall thickening of the rectosigmoid colon.   Original Report Authenticated By: Burman Nieves, M.D.     EKG: Normal sinus rhythm, no ST/T wave changes  Debbora Presto, MD  [facility] Pager [phone]  If 7PM-7AM, please contact night-coverage [email or web address] Password [email or web address] 08/12/2012, 5:52 AM

## 2012-08-12 NOTE — ED Notes (Signed)
PT. REPORTS BLOODY STOOLS ONSET LAST NIGHT , DENIES NAUSEA OR VOMITTING , NO FEVER OR CHILLS, HEMODIALYSIS -MON/TUES/THURS/SAT , NO ABDOMINAL PAIN .

## 2012-08-12 NOTE — Progress Notes (Signed)
Patient seen and examined. Admitted earlier today for rectal bleeding. She takes NSAIDS for phantom limb pain. Plan is to proceed with EGD today and possibly will need colonoscopy as well. Hb is 7.6 down from 10 on 11/26. WIll be transfused 2 units today. Will continue to follow.  Paula Hernandez, MD [facility] Pager: [phone]  

## 2012-08-12 NOTE — ED Notes (Signed)
Pt returned from CT °

## 2012-08-12 NOTE — Op Note (Signed)
[name] [address] [place], [address]   ENDOSCOPY PROCEDURE REPORT  PATIENT: [name], Paula Langenderfer  MR#: [id number] BIRTHDATE: [birth year] , 51  yrs. old GENDER: Female ENDOSCOPIST:Orlandria Kissner, MD REFERRED BY:  Hospitalist. Hemodialysis patient PROCEDURE DATE:  08/12/2012 PROCEDURE:    EGD ASA CLASS:     class III INDICATIONS:   GI bleeding and hemodialysis patient MEDICATION:   fentanyl 25 mcg Versed 2 mg IV TOPICAL ANESTHETIC:    Cetacaine spray  DESCRIPTION OF PROCEDURE: Pentax upper endoscope was inserted blindly with swallowing and advanced. The entire esophagus was seen well was normal. There was no signs of esophagitis or esophageal varices. The GE junction appeared to be at the diaphragm. The stomach was entered and carefully examined in the forward and retroflex view and was grossly normal with no active or recent bleeding. The duodenum was entered to the second duodenum was normal. Very mild duodenitis in the bulb but no ulceration and no signs of active or recent bleeding. The scope was withdrawn the patient tolerated the procedure well     COMPLICATIONS: None  ENDOSCOPIC IMPRESSION: 1. GI bleeding and anemia. No signs of upper GI source. Suspect lower GI source.  RECOMMENDATIONS: 1. We'll go ahead and plan colonoscopy tomorrow afternoon. Have discussed this previously with the patient.    _______________________________ eSigned:  Carman Ching, MD 08/12/2012 12:40 PM

## 2012-08-12 NOTE — ED Notes (Signed)
Placed on cardiac monitor in [address].

## 2012-08-12 NOTE — ED Notes (Signed)
Pt requests IV team be contacted to start IV.  Declines RN and MD attempts.

## 2012-08-12 NOTE — Progress Notes (Signed)
Pt admitted to the unit. Pt is alert and oriented. Pt oriented to room, staff, and call bell. Bed in lowest position. Full assessment to Epic. Call bell with in reach. MD, notification of pt arrival to floor via text page.Told to call for assists. Will continue to monitor. Burley Saver, RN

## 2012-08-12 NOTE — Interval H&P Note (Signed)
History and Physical Interval Note:  08/12/2012 12:18 PM  [name]  has presented today for surgery, with the diagnosis of gi bleed  The various methods of treatment have been discussed with the patient and family. After consideration of risks, benefits and other options for treatment, the patient has consented to  Procedure(s) (LRB) with comments: ESOPHAGOGASTRODUODENOSCOPY (EGD) (N/A) as a surgical intervention .  The patient's history has been reviewed, patient examined, no change in status, stable for surgery.  I have reviewed the patient's chart and labs.  Questions were answered to the patient's satisfaction.     Lafe Clerk JR,Concetta Guion L

## 2012-08-12 NOTE — Consult Note (Addendum)
Reason for Consult: Continuity of ESRD care Referring Physician: Peggye Pitt M.D.- [facility].  [name] is an 51 y.o. female.  HPI:  51 year old African American woman with past medical history significant for end-stage renal disease on hemodialysis secondary to underlying hypertension and diabetes. She dialyzes 4 days a week secondary to previous problems with volume overload/recurrent hospitalizations at [facility].  Presented to the emergency room yesterday after sudden onset of painless bloody stools. She describes some associated tenesmus. Hemoglobin on initial evaluation noted to be low at 7.6 (hemoglobin on 07/30/2012 was 10 g/dL). She denies any associated nausea or vomiting and denies any hematemesis. Denies any obvious orthostasis-limited by physical abilities status post bilateral amputations. Denies any chest pain or shortness of breath.  Hemodialysis prescription: [facility], hemodialysis on Monday, Tuesday, Thursday, Saturday. 4 hours and 15 minutes (M./T./S.)-dialysis only 4 hours on Tuesdays. 2K/2 calcium. Estimated dry weight 61 kg. Left upper arm arteriovenous loop graft. Blood flow rate 400, dialysate flow rate autoflow 1.5. 15-gauge needles. UF profile 2, dialysate temperature 35.64F. Epogen 6400 units 3 days a week Zemplar 18 mcg 3 days a week Heparin 3500 unit bolus predialysis  Past Medical History  Diagnosis Date  . Hypertension   . Anemia   . Systolic CHF, acute on chronic   . Coronary artery disease   . Peripheral vascular disease   . Dialysis patient 01/02/12    [name]; [place]; Thurs; Sat.  . CHF (congestive heart failure)   . Angina   . Shortness of breath 01/02/12    "@rest ; lying down; w/exertion"  . Type II diabetes mellitus   . Blood transfusion   . End stage kidney disease     Past Surgical History  Procedure Date  . Cardiac catheterization   . 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 Above knee  leg amputation 05/2010    right  . Av fistula placement ~ 2003    left upper arm  . Abdominal hysterectomy 1990's  . Arteriovenous graft placement 2011    left upper arm  . Vascular surgery     Family History  Problem Relation Age of Onset  . Breast cancer Sister   . Breast cancer Sister     Social History:  reports that she has never smoked. She has never used smokeless tobacco. She reports that she does not drink alcohol or use illicit drugs.  Allergies: No Known Allergies  Medications:  Scheduled:   . amLODipine  10 mg Oral QHS  . aspirin EC  81 mg Oral Daily  . calcium acetate  1,334 mg Oral TID WC  . cinacalcet  60 mg Oral Q breakfast  . famotidine  20 mg Oral QHS  . insulin aspart  1-5 Units Subcutaneous TID AC  . insulin glargine  36 Units Subcutaneous QHS  . losartan  50 mg Oral QHS  . sodium chloride  3 mL Intravenous Q12H  . [DISCONTINUED] cinacalcet  60 mg Oral Daily  . [DISCONTINUED] irbesartan  75 mg Oral Daily    Results for orders placed during the hospital encounter of 08/12/12 (from the past 48 hour(s))  CBC WITH DIFFERENTIAL     Status: Abnormal   Collection Time   08/12/12  3:34 AM      Component Value Range Comment   WBC 6.9  4.0 - 10.5 K/uL    RBC 2.72 (*) 3.87 - 5.11 MIL/uL    Hemoglobin 7.6 (*) 12.0 -  15.0 g/dL    HCT 16.1 (*) 09.6 - 46.0 %    MCV 84.6  78.0 - 100.0 fL    MCH 27.9  26.0 - 34.0 pg    MCHC 33.0  30.0 - 36.0 g/dL    RDW 04.5  40.9 - 81.1 %    Platelets 229  150 - 400 K/uL    Neutrophils Relative 69  43 - 77 %    Neutro Abs 4.8  1.7 - 7.7 K/uL    Lymphocytes Relative 22  12 - 46 %    Lymphs Abs 1.5  0.7 - 4.0 K/uL    Monocytes Relative 6  3 - 12 %    Monocytes Absolute 0.4  0.1 - 1.0 K/uL    Eosinophils Relative 2  0 - 5 %    Eosinophils Absolute 0.2  0.0 - 0.7 K/uL    Basophils Relative 1  0 - 1 %    Basophils Absolute 0.0  0.0 - 0.1 K/uL   COMPREHENSIVE METABOLIC PANEL     Status: Abnormal   Collection Time   08/12/12   3:34 AM      Component Value Range Comment   Sodium 134 (*) 135 - 145 mEq/L    Potassium 4.0  3.5 - 5.1 mEq/L    Chloride 91 (*) 96 - 112 mEq/L    CO2 24  19 - 32 mEq/L    Glucose, Bld 255 (*) 70 - 99 mg/dL    BUN 61 (*) 6 - 23 mg/dL    Creatinine, Ser 9.14 (*) 0.50 - 1.10 mg/dL    Calcium 78.2  8.4 - 10.5 mg/dL    Total Protein 6.7  6.0 - 8.3 g/dL    Albumin 3.2 (*) 3.5 - 5.2 g/dL    AST 12  0 - 37 U/L    ALT 7  0 - 35 U/L    Alkaline Phosphatase 77  39 - 117 U/L    Total Bilirubin 0.2 (*) 0.3 - 1.2 mg/dL    GFR calc non Af Amer 4 (*) >90 mL/min    GFR calc Af Amer 5 (*) >90 mL/min   MAGNESIUM     Status: Normal   Collection Time   08/12/12  3:34 AM      Component Value Range Comment   Magnesium 2.3  1.5 - 2.5 mg/dL   PHOSPHORUS     Status: Abnormal   Collection Time   08/12/12  3:34 AM      Component Value Range Comment   Phosphorus 7.6 (*) 2.3 - 4.6 mg/dL   OCCULT BLOOD, POC DEVICE     Status: Normal   Collection Time   08/12/12  4:41 AM      Component Value Range Comment   Fecal Occult Bld POSITIVE     GLUCOSE, CAPILLARY     Status: Abnormal   Collection Time   08/12/12  7:40 AM      Component Value Range Comment   Glucose-Capillary 178 (*) 70 - 99 mg/dL    Comment 1 Documented in Chart     GLUCOSE, CAPILLARY     Status: Abnormal   Collection Time   08/12/12  8:33 AM      Component Value Range Comment   Glucose-Capillary 152 (*) 70 - 99 mg/dL    Comment 1 Documented in Chart      Comment 2 Notify RN       Ct Abdomen Pelvis Wo Contrast  08/12/2012  *  RADIOLOGY REPORT*  Clinical Data: Bloody stools.  Hemodialysis patient.  CT ABDOMEN AND PELVIS WITHOUT CONTRAST  Technique:  Multidetector CT imaging of the abdomen and pelvis was performed following the standard protocol without intravenous contrast.  Comparison: None.  Findings: Linear atelectasis in the lung bases.  Mild cardiac enlargement.  Calcifications in the liver and spleen likely represent granulomas. A simple  appearing cysts in the liver, largest measuring 2.1 cm diameter.  The liver, spleen, gallbladder, pancreas, adrenal glands, and retroperitoneal lymph nodes are otherwise unremarkable. There is diffuse calcification of the abdominal aorta and branch vessels.  Significant calcification is present in the superior mesenteric artery.  Mesenteric stenosis is not excluded.  Bilateral renal atrophy with multiple renal cysts.  Multiple bilateral renal calcifications appear to be vascular calcifications.  No hydronephrosis.  The stomach, small bowel, and colon are not abnormally distended and no specific wall thickening is appreciated.  No free air or free fluid in the abdomen.  Pelvis:  The uterus appears surgically absent.  No abnormal adnexal masses.  Prominence of the wall of the rectosigmoid colon could represent bowel wall thickening such as inflammatory process or could represent incomplete distension or muscular hypertrophy.  No evidence of diverticulitis.  The appendix is normal.  No free or loculated pelvic fluid collections.  Normal alignment of lumbar vertebrae with mild degenerative change.  IMPRESSION: Extensive vascular calcifications.  Bilateral renal parenchymal atrophy and multiple cysts.  Nonspecific wall thickening of the rectosigmoid colon.   Original Report Authenticated By: Burman Nieves, M.D.     Review of Systems  Constitutional: Negative.   HENT: Negative.   Eyes: Negative.   Respiratory: Negative.   Cardiovascular: Negative.   Gastrointestinal: Positive for blood in stool. Negative for heartburn, nausea, vomiting, abdominal pain, diarrhea, constipation and melena.  Genitourinary: Negative.   Musculoskeletal: Negative.   Skin: Negative.   Neurological: Negative.   Endo/Heme/Allergies: Negative.   Psychiatric/Behavioral: Negative.   All other systems reviewed and are negative.   Blood pressure 147/67, pulse 88, temperature 97.7 F (36.5 C), temperature source Oral, resp. rate 18,  SpO2 100.00%. Physical Exam  Nursing note and vitals reviewed. Constitutional: She is oriented to person, place, and time. She appears well-developed and well-nourished.  HENT:  Head: Normocephalic and atraumatic.  Right Ear: External ear normal.  Nose: Nose normal.  Mouth/Throat: Oropharynx is clear and moist. No oropharyngeal exudate.       Baseline divergent squint  Eyes: Conjunctivae normal and EOM are normal. Pupils are equal, round, and reactive to light. No scleral icterus.  Neck: Normal range of motion. Neck supple. No JVD present. No tracheal deviation present. No thyromegaly present.  Cardiovascular: Normal rate, regular rhythm and normal heart sounds.  Exam reveals no gallop and no friction rub.   No murmur heard. Respiratory: Effort normal and breath sounds normal. No stridor. No respiratory distress. She has no wheezes. She has no rales. She exhibits no tenderness.  GI: Soft. Bowel sounds are normal. She exhibits no distension and no mass. There is no tenderness. There is no rebound and no guarding.  Musculoskeletal: Normal range of motion.       Status post left below-knee amputation and right above-knee amputation.  Lymphadenopathy:    She has no cervical adenopathy.  Neurological: She is alert and oriented to person, place, and time. No cranial nerve deficit. Coordination normal.  Skin: Skin is warm and dry. No rash noted. No erythema.  Psychiatric: She has a normal mood and affect. Her  behavior is normal. Thought content normal.    Assessment/Plan: 1. Hematochezia with acute blood loss anemia: Suspected upper gastrointestinal etiology-plans noted by gastroenterology for endoscopy earlier today to facilitate dialysis later. If upper GI workup is negative, colonoscopy will be undertaken. 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 2. End-stage renal disease: Plan for hemodialysis  later today and then again on Tuesday, Thursday and Saturday schedule. No heparin will be used. 3. Anemia of chronic kidney disease compounded by acute blood loss anemia: Start on high dose ESA-she has had elevated ferritin that is prohibitive to intravenous iron therapy. 4. Metabolic bone disease: Restart binders when she is on a satisfactory diet, resume Sensipar and Zemplar. 5. Hypertension: She remains hemodynamically stable in spite of acute blood loss-Hold Benicar/amlodipine for now.  Shawndrea Rutkowski K. 08/12/2012, 10:24 AM

## 2012-08-12 NOTE — ED Notes (Signed)
Pt states she began having 6  bloody liquid stools onset 1800 hrs 12.8.13.  No prior history of same.  Denies fever chills, abd pain

## 2012-08-13 ENCOUNTER — Encounter (HOSPITAL_COMMUNITY)
Admission: EM | Disposition: A | Discharge status: Home / Self Care | Payer: Self-pay | Source: Home / Self Care | Attending: Internal Medicine

## 2012-08-13 ENCOUNTER — Encounter (HOSPITAL_COMMUNITY): Payer: Self-pay | Admitting: Gastroenterology

## 2012-08-13 DIAGNOSIS — N186 End stage renal disease: Secondary | ICD-10-CM

## 2012-08-13 DIAGNOSIS — E119 Type 2 diabetes mellitus without complications: Secondary | ICD-10-CM

## 2012-08-13 HISTORY — PX: COLONOSCOPY: SHX5424

## 2012-08-13 LAB — CBC
MCH: 27.1 pg (ref 26.0–34.0)
MCHC: 33 g/dL (ref 30.0–36.0)
MCV: 82.1 fL (ref 78.0–100.0)
Platelets: 206 10*3/uL (ref 150–400)
RDW: 15.1 % (ref 11.5–15.5)

## 2012-08-13 LAB — RENAL FUNCTION PANEL
Albumin: 3.3 g/dL — ABNORMAL LOW (ref 3.5–5.2)
BUN: 18 mg/dL (ref 6–23)
Calcium: 9.9 mg/dL (ref 8.4–10.5)
Creatinine, Ser: 5.14 mg/dL — ABNORMAL HIGH (ref 0.50–1.10)
Phosphorus: 4.6 mg/dL (ref 2.3–4.6)
Potassium: 3.1 mEq/L — ABNORMAL LOW (ref 3.5–5.1)

## 2012-08-13 LAB — GLUCOSE, CAPILLARY
Glucose-Capillary: 66 mg/dL — ABNORMAL LOW (ref 70–99)
Glucose-Capillary: 56 mg/dL — ABNORMAL LOW (ref 70–99)
Glucose-Capillary: 59 mg/dL — ABNORMAL LOW (ref 70–99)
Glucose-Capillary: 55 mg/dL — ABNORMAL LOW (ref 70–99)

## 2012-08-13 LAB — TYPE AND SCREEN: Unit division: 0

## 2012-08-13 SURGERY — COLONOSCOPY
Anesthesia: Moderate Sedation

## 2012-08-13 MED ORDER — NEPRO/CARBSTEADY PO LIQD: 237.0000 mL | ORAL | Status: DC | PRN

## 2012-08-13 MED ORDER — GLUCOSE 40 % PO GEL: 1.0000 | ORAL | Status: DC | PRN

## 2012-08-13 MED ORDER — PARICALCITOL 5 MCG/ML IV SOLN
INTRAVENOUS | Status: AC
  Administered 2012-08-13: 18 ug via INTRAVENOUS
  Filled 2012-08-13: qty 4

## 2012-08-13 MED ORDER — ALTEPLASE 2 MG IJ SOLR
2.0000 mg | Freq: Once | INTRAMUSCULAR | Status: DC | PRN
  Filled 2012-08-13: qty 2

## 2012-08-13 MED ORDER — SODIUM CHLORIDE 0.9 % IV SOLN: 100.0000 mL | INTRAVENOUS | Status: DC | PRN

## 2012-08-13 MED ORDER — PENTAFLUOROPROP-TETRAFLUOROETH EX AERO: 1.0000 "application " | INHALATION_SPRAY | CUTANEOUS | Status: DC | PRN

## 2012-08-13 MED ORDER — FENTANYL CITRATE 0.05 MG/ML IJ SOLN
INTRAMUSCULAR | Status: DC | PRN
  Administered 2012-08-13 (×2): 25 ug via INTRAVENOUS

## 2012-08-13 MED ORDER — LIDOCAINE HCL (PF) 1 % IJ SOLN: 5.0000 mL | INTRAMUSCULAR | Status: DC | PRN

## 2012-08-13 MED ORDER — MIDAZOLAM HCL 5 MG/ML IJ SOLN
INTRAMUSCULAR | Status: AC
  Filled 2012-08-13: qty 2

## 2012-08-13 MED ORDER — HEPARIN SODIUM (PORCINE) 1000 UNIT/ML DIALYSIS
1000.0000 [IU] | INTRAMUSCULAR | Status: DC | PRN
  Filled 2012-08-13: qty 1

## 2012-08-13 MED ORDER — MIDAZOLAM HCL 5 MG/5ML IJ SOLN
INTRAMUSCULAR | Status: DC | PRN
  Administered 2012-08-13 (×2): 2 mg via INTRAVENOUS

## 2012-08-13 MED ORDER — DEXTROSE 50 % IV SOLN
INTRAVENOUS | Status: AC
  Administered 2012-08-13: 25 mL
  Filled 2012-08-13: qty 50

## 2012-08-13 MED ORDER — FENTANYL CITRATE 0.05 MG/ML IJ SOLN
INTRAMUSCULAR | Status: AC
  Filled 2012-08-13: qty 2

## 2012-08-13 MED ORDER — GLUCOSE 40 % PO GEL
ORAL | Status: AC
  Administered 2012-08-13: 37.5 g
  Filled 2012-08-13: qty 1

## 2012-08-13 MED ORDER — LIDOCAINE-PRILOCAINE 2.5-2.5 % EX CREA: 1.0000 "application " | TOPICAL_CREAM | CUTANEOUS | Status: DC | PRN

## 2012-08-13 NOTE — Progress Notes (Signed)
Patient ID: [name], female   DOB: 1961-06-24, 51 y.o.   MRN: [id number]   [facility] Progress Note    Subjective:   Reports to be feeling well and states that amount of blood in stool continues to decrease- currently on Nu-Lytely for colonoscopy preparation   Objective:   BP 154/73  Pulse 80  Temp 98.2 F (36.8 C) (Oral)  Resp 18  Ht 5\' 2"  (1.575 m)  Wt 63.866 kg (140 lb 12.8 oz)  BMI 25.75 kg/m2  SpO2 94%  Physical Exam: WUJ:[id number] resting in bed, watching TV AOZ:HYQMV RRR, normal S1 and S2  Resp:CTA bilaterally, no rales/rhonchi HQI:ONGE, flat, NT, BS normal Ext: S/P Lt BKA and Rt AKA  Labs: BMET  Lab 08/13/12 0500 08/12/12 0334  NA 138 134*  K 3.1* 4.0  CL 95* 91*  CO2 30 24  GLUCOSE 67* 255*  BUN 18 61*  CREATININE 5.14* 9.54*  ALB -- --  CALCIUM 9.9 10.0  PHOS 4.6 7.6*   CBC  Lab 08/13/12 0500 08/12/12 0334  WBC 7.8 6.9  NEUTROABS -- 4.8  HGB 9.7* 7.6*  HCT 29.4* 23.0*  MCV 82.1 84.6  PLT 206 229   Medications:      . amLODipine  10 mg Oral QHS  . aspirin EC  81 mg Oral Daily  . calcium acetate  1,334 mg Oral TID WC  . cinacalcet  120 mg Oral Q breakfast  . [COMPLETED] dextrose      . famotidine  20 mg Oral QHS  . insulin aspart  1-5 Units Subcutaneous TID AC  . insulin glargine  36 Units Subcutaneous QHS  . losartan  50 mg Oral QHS  . paricalcitol  18 mcg Intravenous Q T,Th,Sa-HD  . [COMPLETED] polyethylene glycol-electrolytes  4,000 mL Oral Once  . sodium chloride  3 mL Intravenous Q12H  . [DISCONTINUED] cinacalcet  60 mg Oral Q breakfast     Assessment/ Plan:   1. Hematochezia with acute blood loss anemia: EGD negative for bleeding source- colonoscopy today. Pattern now suggestive of diverticular source. Hemodynamically stable and with decreased intensity of bleed.  2. End-stage renal disease: Plan for hemodialysis later today after colonoscopy (dialyzes on a M/T/T/S schedule). No heparin will be used.  3.  Anemia of chronic kidney disease compounded by acute blood loss anemia: Improved S/P 2 units PRBCs, continue high dose ESA.  4. Metabolic bone disease: Restart binders when she is on a satisfactory diet, resume Sensipar and Zemplar.  5. Hypertension: She remains hemodynamically stable in spite of acute blood loss-Monitor on Benicar/amlodipine for now.    Zetta Bills, MD 08/13/2012, 9:24 AM

## 2012-08-13 NOTE — Interval H&P Note (Signed)
History and Physical Interval Note:  08/13/2012 1:46 PM  [name]  has presented today for surgery, with the diagnosis of gi bleed  The various methods of treatment have been discussed with the patient and family. After consideration of risks, benefits and other options for treatment, the patient has consented to  Procedure(s) (LRB) with comments: COLONOSCOPY (N/A) as a surgical intervention .  The patient's history has been reviewed, patient examined, no change in status, stable for surgery.  I have reviewed the patient's chart and labs.  Questions were answered to the patient's satisfaction.     Kush Farabee JR,Jarelly Rinck L

## 2012-08-13 NOTE — Progress Notes (Signed)
CBG: 56  Treatment: 15 GM carbohydrate snack  Symptoms: None  Follow-up CBG: Time:0829 CBG Result:70  Possible Reasons for Event: Inadequate meal intake as pt is on clears for procedure  Comments/MD notified:Dr. Ardyth Harps rounding on pt and aware. Continue monitoring closely    [name], Dirk Dress

## 2012-08-13 NOTE — Progress Notes (Signed)
[facility]             Progress Note   Subjective: No complaints. No further rectal bleeding.  Objective: Vital signs in last 24 hours: Temp:  [97.1 F (36.2 C)-98.9 F (37.2 C)] 98.2 F (36.8 C) (12/10 0745) Pulse Rate:  [69-92] 80  (12/10 0745) Resp:  [13-27] 18  (12/10 0745) BP: (113-171)/(50-108) 154/73 mmHg (12/10 0745) SpO2:  [92 %-100 %] 94 % (12/10 0745) Weight:  [62.596 kg (138 lb)-64.2 kg (141 lb 8.6 oz)] 63.866 kg (140 lb 12.8 oz) (12/09 2005) Weight change:  Last BM Date: 08/12/12  Intake/Output from previous day: 12/09 0701 - 12/10 0700 In: 350 [Blood:350] Out: 1350      Physical Exam: General: Alert, awake, oriented x3, in no acute distress. HEENT: No bruits, no goiter. Heart: Regular rate and rhythm, without murmurs, rubs, gallops. Lungs: Clear to auscultation bilaterally. Abdomen: Soft, nontender, nondistended, positive bowel sounds. Extremities: s/p right AKA and left BKA. Neuro: Grossly intact, nonfocal.    Lab Results: Basic Metabolic Panel:  Basename 08/13/12 0500 08/12/12 0334  NA 138 134*  K 3.1* 4.0  CL 95* 91*  CO2 30 24  GLUCOSE 67* 255*  BUN 18 61*  CREATININE 5.14* 9.54*  CALCIUM 9.9 10.0  MG -- 2.3  PHOS 4.6 7.6*   Liver Function Tests:  Basename 08/13/12 0500 08/12/12 0334  AST -- 12  ALT -- 7  ALKPHOS -- 77  BILITOT -- 0.2*  PROT -- 6.7  ALBUMIN 3.3* 3.2*   CBC:  Basename 08/13/12 0500 08/12/12 0334  WBC 7.8 6.9  NEUTROABS -- 4.8  HGB 9.7* 7.6*  HCT 29.4* 23.0*  MCV 82.1 84.6  PLT 206 229   CBG:  Basename 08/13/12 0913 08/13/12 0829 08/13/12 0743 08/13/12 0206 08/13/12 0134 08/13/12 0100  GLUCAP 108* 70 56* 88 66* 60*   Hemoglobin A1C:  Basename 08/12/12 0707  HGBA1C 9.1*   Urine Drug Screen: Drugs of Abuse     Component Value Date/Time   LABOPIA NONE DETECTED 06/23/2008 0529   COCAINSCRNUR NONE DETECTED 06/23/2008 0529   LABBENZ NONE DETECTED 06/23/2008 0529   AMPHETMU NONE  DETECTED 06/23/2008 0529   THCU NONE DETECTED 06/23/2008 0529   LABBARB  Value: NONE DETECTED        DRUG SCREEN FOR MEDICAL PURPOSES ONLY.  IF CONFIRMATION IS NEEDED FOR ANY PURPOSE, NOTIFY LAB WITHIN 5 DAYS. 06/23/2008 0529     Studies/Results: Ct Abdomen Pelvis Wo Contrast  08/12/2012  *RADIOLOGY REPORT*  Clinical Data: Bloody stools.  Hemodialysis patient.  CT ABDOMEN AND PELVIS WITHOUT CONTRAST  Technique:  Multidetector CT imaging of the abdomen and pelvis was performed following the standard protocol without intravenous contrast.  Comparison: None.  Findings: Linear atelectasis in the lung bases.  Mild cardiac enlargement.  Calcifications in the liver and spleen likely represent granulomas. A simple appearing cysts in the liver, largest measuring 2.1 cm diameter.  The liver, spleen, gallbladder, pancreas, adrenal glands, and retroperitoneal lymph nodes are otherwise unremarkable. There is diffuse calcification of the abdominal aorta and branch vessels.  Significant calcification is present in the superior mesenteric artery.  Mesenteric stenosis is not excluded.  Bilateral renal atrophy with multiple renal cysts.  Multiple bilateral renal calcifications appear to be vascular calcifications.  No hydronephrosis.  The stomach, small bowel, and colon are not abnormally distended and no specific wall thickening is appreciated.  No free air or free fluid in the abdomen.  Pelvis:  The uterus appears  surgically absent.  No abnormal adnexal masses.  Prominence of the wall of the rectosigmoid colon could represent bowel wall thickening such as inflammatory process or could represent incomplete distension or muscular hypertrophy.  No evidence of diverticulitis.  The appendix is normal.  No free or loculated pelvic fluid collections.  Normal alignment of lumbar vertebrae with mild degenerative change.  IMPRESSION: Extensive vascular calcifications.  Bilateral renal parenchymal atrophy and multiple cysts.   Nonspecific wall thickening of the rectosigmoid colon.   Original Report Authenticated By: Burman Nieves, M.D.     Medications: Scheduled Meds:   . amLODipine  10 mg Oral QHS  . aspirin EC  81 mg Oral Daily  . calcium acetate  1,334 mg Oral TID WC  . cinacalcet  120 mg Oral Q breakfast  . [COMPLETED] dextrose      . famotidine  20 mg Oral QHS  . insulin aspart  1-5 Units Subcutaneous TID AC  . insulin glargine  36 Units Subcutaneous QHS  . losartan  50 mg Oral QHS  . paricalcitol  18 mcg Intravenous Q T,Th,Sa-HD  . [COMPLETED] polyethylene glycol-electrolytes  4,000 mL Oral Once  . sodium chloride  3 mL Intravenous Q12H  . [DISCONTINUED] cinacalcet  60 mg Oral Q breakfast   Continuous Infusions:   . sodium chloride 20 mL/hr at 08/12/12 1212  . sodium chloride     PRN Meds:.sodium chloride, sodium chloride, [EXPIRED] alteplase, dextrose, feeding supplement (NEPRO CARB STEADY), heparin, HYDROcodone-acetaminophen, lidocaine, lidocaine-prilocaine, morphine injection, ondansetron (ZOFRAN) IV, ondansetron, pentafluoroprop-tetrafluoroeth, [DISCONTINUED] butamben-tetracaine-benzocaine, [DISCONTINUED] fentaNYL, [DISCONTINUED] midazolam  Assessment/Plan:  Principal Problem:  *Rectal bleed Active Problems:  CKD (chronic kidney disease) requiring chronic dialysis  HTN (hypertension)-accelerated/uncontrolled at admission  DM (diabetes mellitus)  Anemia   Rectal Bleeding -No further episodes. -EGD without source. -Plan is for a colonoscopy today. -Appreciate GI input.  Acute Blood Loss Anemia -On top of AOCD. -s/p 2 units of PRBCs in HD yesterday. -Hb up to 9.7. -No further plans for transfusion at this point.  ESRD -On HD MTTS. -She gets an extra session a week because of frequent volume overload. -Per renal.  DM with hypoglycemia -She was given her normal lantus dose despite her being NPO. -Have asked RN to check CBGs q 2 hours for now. -Treat with clear liquids of D50  (as she is for colonoscopy today).  Disposition -If colonoscopy results are normal, anticipate she may DC home in am.  Time spent coordinating care: 35 minutes.   LOS: 1 day   [facility] Pager: [phone] 08/13/2012, 9:16 AM

## 2012-08-13 NOTE — Progress Notes (Signed)
Hypoglycemic Event  CBG: 66  Treatment: sprite per pt's request  Symptoms: none  Follow-up CBG: Time:02:06 CBG Result:88  Possible Reasons for Event: pt NPO  Comments/MD notified:yes    [name], Paula Nielsen  Remember to initiate Hypoglycemia Order Set & complete

## 2012-08-13 NOTE — Op Note (Signed)
[name] [address] [place], [address]   COLONOSCOPY PROCEDURE REPORT  PATIENT: [name], [name]  MR#: [id number] BIRTHDATE: 1960-11-02 , 51  yrs. old GENDER: Female ENDOSCOPIST: Carman Ching, MD REFERRED BY:   [facility] patient is a dialysis patient PROCEDURE DATE:  08/13/2012 PROCEDURE:   Colonoscopy ASA CLASS:   class III INDICATIONS:  rectal bleeding anemia with negative EGD yesterday MEDICATIONS: fentanyl 50 mcg Versed 4 mg IV  DESCRIPTION OF PROCEDURE: Pentax pediatric colonoscope inserted and advanced. Cecum reached ileocecal valve and appendiceal orifice seen. No active bleeding at all throughout the colon. No coffee ground material clots Melvern Banker. Colon completely clear. Cecum search carefully for AVMs none seen. Scattered diverticula throughout the colon primarily in the right colon. These were rather large diverticula and there were areas of the colon was not well. No significant hemorrhoids seen with retroflexion. Patient tolerated procedure well.     COMPLICATIONS: None  ENDOSCOPIC IMPRESSION: 1. GI bleeding no obvious source seen on colonoscopy 2. Diverticulosis. Several large diverticula more so in right colon less in the left colon.  RECOMMENDATIONS: would go ahead and resume diet follow clinically. Observe for further bleeding.    _______________________________ Rosalie DoctorCarman Ching, MD 08/13/2012 2:47 PM   CC: Dr. Allena Katz

## 2012-08-13 NOTE — Progress Notes (Signed)
Hypoglycemic Event  CBG: 60  Treatment: glucose gel  Symptoms: none  Follow-up CBG: Time:1:34 CBG Result:66  Possible Reasons for Event: pt NPO  Comments/MD notified:yes    Sparks, Lavetta Nielsen  Remember to initiate Hypoglycemia Order Set & complete

## 2012-08-14 ENCOUNTER — Encounter (HOSPITAL_COMMUNITY): Payer: Self-pay | Admitting: Gastroenterology

## 2012-08-14 DIAGNOSIS — K625 Hemorrhage of anus and rectum: Secondary | ICD-10-CM

## 2012-08-14 LAB — CBC
HCT: 29.8 % — ABNORMAL LOW (ref 36.0–46.0)
MCHC: 32.2 g/dL (ref 30.0–36.0)
RDW: 15.1 % (ref 11.5–15.5)

## 2012-08-14 MED ORDER — ONDANSETRON 4 MG PO TBDP: 4.0000 mg | ORAL_TABLET | Freq: Three times a day (TID) | ORAL | Status: DC | PRN

## 2012-08-14 MED ORDER — CALCIUM ACETATE (PHOS BINDER) 667 MG/5ML PO SOLN
1334.0000 mg | Freq: Three times a day (TID) | ORAL | Status: DC
  Filled 2012-08-14 (×7): qty 10

## 2012-08-14 MED ORDER — INSULIN GLARGINE 100 UNIT/ML ~~LOC~~ SOLN: 16.0000 [IU] | Freq: Every day | SUBCUTANEOUS | Status: DC

## 2012-08-14 NOTE — Discharge Summary (Signed)
Physician Discharge Summary  [name] ZOX:[id number] DOB: 08-27-1961 DOA: 08/12/2012  PCP: Cecille Aver, MD  Admit date: 08/12/2012 Discharge date: 08/14/2012  Time spent: > 35 minutes  Recommendations for Outpatient Follow-up:  1. Please be sure to follow up with hemoglobin 2. Will hold aspirin given recent GI bleed with recorded hgb of 7.6 3. Also follow up with potassium level  Discharge Diagnoses:  Principal Problem:  *Rectal bleed Active Problems:  CKD (chronic kidney disease) requiring chronic dialysis  HTN (hypertension)-accelerated/uncontrolled at admission  DM (diabetes mellitus)  Anemia   Discharge Condition: stable  Diet recommendation: renal/diabetic  Filed Weights   08/13/12 1735 08/13/12 2150 08/13/12 2256  Weight: 65.1 kg (143 lb 8.3 oz) 61.8 kg (136 lb 3.9 oz) 61.8 kg (136 lb 3.9 oz)    History of present illness:  From original HPI: Pt is 51 yo female with ESRD on HD (MWF, follows with Dr. Kathrene Bongo) who presents to [facility] ED with main concern of 4 episodes of rectal bleeding that initially started one day prior to admission, described as bright red blood, associated with intermittent episodes of generalized abdominal discomfort. Pt denies similar events in the past, denies fevers, chills, other systemic symptoms. She reports compliance with medications and with HD, tolerating HD well.  Hospital Course:  Principal Problem:  *Rectal bleed  Active Problems:  CKD (chronic kidney disease) requiring chronic dialysis  HTN (hypertension)-accelerated/uncontrolled at admission  DM (diabetes mellitus)  Anemia   Rectal Bleeding  -No further episodes.  -EGD without source.  - bleeding identified as diverticular will plan on holding Aspirin and NSAIDs on discharge  Acute Blood Loss Anemia  -On top of AOCD.  -s/p 2 units of PRBCs in HD yesterday.  -Hb up to 9.7 and on day of discharge was steady at 9.6. -No further plans for transfusion at this  point.   ESRD  -On HD MTTS.  -She gets an extra session a week because of frequent volume overload.  -To continue her home regimen.  Also heparin not to be used initially and this has been discussed with patient.   DM with hypoglycemia  - Will decreased her home lantus dose - continue diabetic diet - If eating less than 50% of meals patient to hold her insulin aspart - continue checking blood sugars frequently - hypoglycemia resolved with improved oral intake.   Procedures:  Colonoscopy and EGD  Consultations:  Nephrology  GI  Discharge Exam: Filed Vitals:   08/13/12 2150 08/13/12 2256 08/14/12 0501 08/14/12 0919  BP: 154/81 138/81 163/75 162/78  Pulse: 97 100 96 99  Temp: 98.6 F (37 C) 98.2 F (36.8 C) 98.8 F (37.1 C) 98 F (36.7 C)  TempSrc: Oral Oral Oral Oral  Resp: 17 17 17 18   Height:      Weight: 61.8 kg (136 lb 3.9 oz) 61.8 kg (136 lb 3.9 oz)    SpO2: 100% 100% 99% 100%    General: Pt in NAD, Alert and Oriented x 3 Cardiovascular: RRR, No MRG Respiratory: CTA BL, no wheezes, no increased work of breathing Abdomen: soft, NT, No rebound tenderness, no guarding  Discharge Instructions  Discharge Orders    Future Orders Please Complete By Expires   Diet - low sodium heart healthy      Increase activity slowly      Discharge instructions      Comments:   Please be sure to f/u with your primary care physician in 1-2 weeks or sooner should any new  concerns arise.  Also continue with your routine hemodialysis sessions.  You are not to get heparin initially while you are doing your HD sessions given your recent GI bleed.   I will hold your aspirin given recent GI bleed please discuss with your GI doctor or primary care physician about restarting this medication.   Call MD for:  temperature >100.4      Call MD for:  persistant nausea and vomiting      Call MD for:  severe uncontrolled pain      Call MD for:  persistant dizziness or light-headedness           Medication List     As of 08/14/2012  1:52 PM    STOP taking these medications         aspirin EC 81 MG tablet      olmesartan 40 MG tablet   Commonly known as: BENICAR      TAKE these medications         amLODipine 10 MG tablet   Commonly known as: NORVASC   Take 10 mg by mouth at bedtime.      calcium acetate 667 MG capsule   Commonly known as: PHOSLO   Take 1,334 mg by mouth 3 (three) times daily with meals.      cinacalcet 60 MG tablet   Commonly known as: SENSIPAR   Take 60 mg by mouth daily.      famotidine 20 MG tablet   Commonly known as: PEPCID   Take 20 mg by mouth at bedtime. One at bedtime      insulin aspart 100 UNIT/ML injection   Commonly known as: novoLOG   Inject 1-5 Units into the skin 3 (three) times daily before meals. Sliding scale      insulin glargine 100 UNIT/ML injection   Commonly known as: LANTUS   Inject 16 Units into the skin at bedtime.      losartan 50 MG tablet   Commonly known as: COZAAR   Take 50 mg by mouth at bedtime.      ondansetron 4 MG disintegrating tablet   Commonly known as: ZOFRAN-ODT   Take 1 tablet (4 mg total) by mouth every 8 (eight) hours as needed for nausea.          The results of significant diagnostics from this hospitalization (including imaging, microbiology, ancillary and laboratory) are listed below for reference.    Significant Diagnostic Studies: Ct Abdomen Pelvis Wo Contrast  08/12/2012  *RADIOLOGY REPORT*  Clinical Data: Bloody stools.  Hemodialysis patient.  CT ABDOMEN AND PELVIS WITHOUT CONTRAST  Technique:  Multidetector CT imaging of the abdomen and pelvis was performed following the standard protocol without intravenous contrast.  Comparison: None.  Findings: Linear atelectasis in the lung bases.  Mild cardiac enlargement.  Calcifications in the liver and spleen likely represent granulomas. A simple appearing cysts in the liver, largest measuring 2.1 cm diameter.  The liver, spleen,  gallbladder, pancreas, adrenal glands, and retroperitoneal lymph nodes are otherwise unremarkable. There is diffuse calcification of the abdominal aorta and branch vessels.  Significant calcification is present in the superior mesenteric artery.  Mesenteric stenosis is not excluded.  Bilateral renal atrophy with multiple renal cysts.  Multiple bilateral renal calcifications appear to be vascular calcifications.  No hydronephrosis.  The stomach, small bowel, and colon are not abnormally distended and no specific wall thickening is appreciated.  No free air or free fluid in the abdomen.  Pelvis:  The  uterus appears surgically absent.  No abnormal adnexal masses.  Prominence of the wall of the rectosigmoid colon could represent bowel wall thickening such as inflammatory process or could represent incomplete distension or muscular hypertrophy.  No evidence of diverticulitis.  The appendix is normal.  No free or loculated pelvic fluid collections.  Normal alignment of lumbar vertebrae with mild degenerative change.  IMPRESSION: Extensive vascular calcifications.  Bilateral renal parenchymal atrophy and multiple cysts.  Nonspecific wall thickening of the rectosigmoid colon.   Original Report Authenticated By: Burman Nieves, M.D.     Microbiology: No results found for this or any previous visit (from the past 240 hour(s)).   Labs: Basic Metabolic Panel:  Lab 08/13/12 7829 08/12/12 0334  NA 138 134*  K 3.1* 4.0  CL 95* 91*  CO2 30 24  GLUCOSE 67* 255*  BUN 18 61*  CREATININE 5.14* 9.54*  CALCIUM 9.9 10.0  MG -- 2.3  PHOS 4.6 7.6*   Liver Function Tests:  Lab 08/13/12 0500 08/12/12 0334  AST -- 12  ALT -- 7  ALKPHOS -- 77  BILITOT -- 0.2*  PROT -- 6.7  ALBUMIN 3.3* 3.2*   No results found for this basename: LIPASE:5,AMYLASE:5 in the last 168 hours No results found for this basename: AMMONIA:5 in the last 168 hours CBC:  Lab 08/14/12 0645 08/13/12 0500 08/12/12 0334  WBC 5.7 7.8 6.9   NEUTROABS -- -- 4.8  HGB 9.6* 9.7* 7.6*  HCT 29.8* 29.4* 23.0*  MCV 84.4 82.1 84.6  PLT 173 206 229   Cardiac Enzymes: No results found for this basename: CKTOTAL:5,CKMB:5,CKMBINDEX:5,TROPONINI:5 in the last 168 hours BNP: BNP (last 3 results)  Basename 01/02/12 0221  PROBNP 16007.0*   CBG:  Lab 08/14/12 1202 08/14/12 0744 08/13/12 2245 08/13/12 1638 08/13/12 1545  GLUCAP 77 108* 132* 126* 55*       Signed:  Penny Pia  [facility] 08/14/2012, 1:52 PM

## 2012-08-14 NOTE — Progress Notes (Signed)
I have personally seen and examined this patient and agree with the assessment/plan as outlined above by [facility] PA. No further hematochezia noted overnight.  With bleeding stopped and etiology identified as diverticular- appears that we may be able to DC her home soon with no heparin at dialysis for the next week and then restart heparin at lower doses. Hgb improved S/P PRBC transfusion.  Tafari Humiston K.,MD 08/14/2012 10:02 AM

## 2012-08-14 NOTE — Progress Notes (Signed)
Subjective:  Eating brk. No cos, tolerated hd yesterday, no stools since gi prep yesterday Objective Vital signs in last 24 hours: Filed Vitals:   08/13/12 2130 08/13/12 2150 08/13/12 2256 08/14/12 0501  BP: 134/78 154/81 138/81 163/75  Pulse: 100 97 100 96  Temp:  98.6 F (37 C) 98.2 F (36.8 C) 98.8 F (37.1 C)  TempSrc:  Oral Oral Oral  Resp: 15 17 17 17   Height:      Weight:  61.8 kg (136 lb 3.9 oz) 61.8 kg (136 lb 3.9 oz)   SpO2: 100% 100% 100% 99%   Weight change: 2.504 kg (5 lb 8.3 oz)  Intake/Output Summary (Last 24 hours) at 08/14/12 0855 Last data filed at 08/14/12 0505  Gross per 24 hour  Intake    483 ml  Output   2300 ml  Net  -1817 ml   Labs: Basic Metabolic Panel:  Lab 08/13/12 9811 08/12/12 0334  NA 138 134*  K 3.1* 4.0  CL 95* 91*  CO2 30 24  GLUCOSE 67* 255*  BUN 18 61*  CREATININE 5.14* 9.54*  CALCIUM 9.9 10.0  ALB -- --  PHOS 4.6 7.6*   Liver Function Tests:  Lab 08/13/12 0500 08/12/12 0334  AST -- 12  ALT -- 7  ALKPHOS -- 77  BILITOT -- 0.2*  PROT -- 6.7  ALBUMIN 3.3* 3.2*   No results found for this basename: LIPASE:3,AMYLASE:3 in the last 168 hours No results found for this basename: AMMONIA:3 in the last 168 hours CBC:  Lab 08/14/12 0645 08/13/12 0500 08/12/12 0334  WBC 5.7 7.8 6.9  NEUTROABS -- -- 4.8  HGB 9.6* 9.7* 7.6*  HCT 29.8* 29.4* 23.0*  MCV 84.4 82.1 84.6  PLT 173 206 229   Cardiac Enzymes: No results found for this basename: CKTOTAL:5,CKMB:5,CKMBINDEX:5,TROPONINI:5 in the last 168 hours CBG:  Lab 08/14/12 0744 08/13/12 2245 08/13/12 1638 08/13/12 1545 08/13/12 1155  GLUCAP 108* 132* 126* 55* 59*    Iron Studies: No results found for this basename: IRON,TIBC,TRANSFERRIN,FERRITIN in the last 72 hours Studies/Results: No results found. Medications:    . [DISCONTINUED] sodium chloride 20 mL/hr at 08/12/12 1212  . [DISCONTINUED] sodium chloride        . amLODipine  10 mg Oral QHS  . aspirin EC  81 mg Oral  Daily  . calcium acetate (Phos Binder)  1,334 mg Oral TID WC  . cinacalcet  120 mg Oral Q breakfast  . [COMPLETED] dextrose      . famotidine  20 mg Oral QHS  . insulin aspart  1-5 Units Subcutaneous TID AC  . insulin glargine  36 Units Subcutaneous QHS  . losartan  50 mg Oral QHS  . paricalcitol  18 mcg Intravenous Q T,Th,Sa-HD  . sodium chloride  3 mL Intravenous Q12H  . [DISCONTINUED] calcium acetate  1,334 mg Oral TID WC   I  have reviewed scheduled and prn medications.  Physical Exam: General: Alert, NAD,  Heart: RRR, no rub or murmur Lungs: CTA Abdomen: Soft nontender, normalbs Extremities: Dialysis Access: R. AKA and L bka stable, LU A AVF pos. bruit   Hemodialysis prescription:  [facility], hemodialysis on Monday, Tuesday, Thursday, Saturday. 4 hours and 15 minutes (M./T./S.)-dialysis only 4 hours on Tuesdays. 2K/2 calcium. Estimated dry weight 61 kg. Left upper arm arteriovenous loop graft. Blood flow rate 400, dialysate flow rate autoflow 1.5. 15-gauge needles. UF profile 2, dialysate temperature 35.54F.  Epogen 6400 units 3 days a week  Zemplar 18  mcg 3 days a week  Heparin 3500 unit bolus predialysis  Problem/Plan=   1. Hematochezia with acute blood loss anemia: EGD negative for bleeding source- colonoscopy  Yesterday + Diverticulosis with " several Large  Diverticula  R> L  Colon/   HGB  stable  This am. ? Still on ASA 81mg  EC  2. End-stage renal disease:M [facility]/  No heparin will be used yesterday , at edw post stable bp on hd 3. Anemia of chronic kidney disease compounded by acute blood loss anemia: am  HGB 9.6 <9.7 Improved S/P 2 units PRBCs, continue high dose ESA.  4. Metabolic bone disease: Restarted  binders  on  soliddiet, resume Sensipar and Zemplar.  5. Hypertension: BP stable in spite of acute blood loss-Monitor on Benicar/amlodipine  6. IDDM = per admit team  Lenny Pastel, PA-C [facility]  [phone] 08/14/2012,8:55 AM  LOS: 2 days

## 2012-08-14 NOTE — Progress Notes (Signed)
Discussed discharge instructions and medications with pt. Pt showed no barriers to discharge. Diverticulosis diet discussed with pt. IV removed. Tele removed. Pt awaiting ride home with husband.

## 2012-08-15 LAB — GLUCOSE, CAPILLARY: Glucose-Capillary: 97 mg/dL (ref 70–99)

## 2012-08-24 ENCOUNTER — Inpatient Hospital Stay (HOSPITAL_COMMUNITY)
Admission: EM | Admit: 2012-08-24 | Discharge: 2012-08-30 | DRG: 193 | Disposition: A | Discharge status: Home / Self Care | Payer: Medicare Other | Attending: Internal Medicine | Admitting: Internal Medicine

## 2012-08-24 ENCOUNTER — Emergency Department (HOSPITAL_COMMUNITY): Payer: Medicare Other

## 2012-08-24 ENCOUNTER — Encounter (HOSPITAL_COMMUNITY): Payer: Self-pay | Admitting: Emergency Medicine

## 2012-08-24 ENCOUNTER — Observation Stay (HOSPITAL_COMMUNITY): Payer: Medicare Other

## 2012-08-24 DIAGNOSIS — I10 Essential (primary) hypertension: Secondary | ICD-10-CM | POA: Diagnosis present

## 2012-08-24 DIAGNOSIS — I509 Heart failure, unspecified: Secondary | ICD-10-CM

## 2012-08-24 DIAGNOSIS — R0902 Hypoxemia: Secondary | ICD-10-CM | POA: Diagnosis present

## 2012-08-24 DIAGNOSIS — N186 End stage renal disease: Secondary | ICD-10-CM | POA: Diagnosis present

## 2012-08-24 DIAGNOSIS — E119 Type 2 diabetes mellitus without complications: Secondary | ICD-10-CM | POA: Diagnosis present

## 2012-08-24 DIAGNOSIS — Z79899 Other long term (current) drug therapy: Secondary | ICD-10-CM

## 2012-08-24 DIAGNOSIS — R1110 Vomiting, unspecified: Secondary | ICD-10-CM

## 2012-08-24 DIAGNOSIS — D649 Anemia, unspecified: Secondary | ICD-10-CM | POA: Diagnosis present

## 2012-08-24 DIAGNOSIS — Z86718 Personal history of other venous thrombosis and embolism: Secondary | ICD-10-CM

## 2012-08-24 DIAGNOSIS — I2510 Atherosclerotic heart disease of native coronary artery without angina pectoris: Secondary | ICD-10-CM | POA: Diagnosis present

## 2012-08-24 DIAGNOSIS — I501 Left ventricular failure: Secondary | ICD-10-CM | POA: Diagnosis present

## 2012-08-24 DIAGNOSIS — Z833 Family history of diabetes mellitus: Secondary | ICD-10-CM

## 2012-08-24 DIAGNOSIS — I5023 Acute on chronic systolic (congestive) heart failure: Secondary | ICD-10-CM | POA: Diagnosis present

## 2012-08-24 DIAGNOSIS — Z794 Long term (current) use of insulin: Secondary | ICD-10-CM

## 2012-08-24 DIAGNOSIS — N2581 Secondary hyperparathyroidism of renal origin: Secondary | ICD-10-CM | POA: Diagnosis present

## 2012-08-24 DIAGNOSIS — R6889 Other general symptoms and signs: Secondary | ICD-10-CM

## 2012-08-24 DIAGNOSIS — S88119A Complete traumatic amputation at level between knee and ankle, unspecified lower leg, initial encounter: Secondary | ICD-10-CM

## 2012-08-24 DIAGNOSIS — E7800 Pure hypercholesterolemia, unspecified: Secondary | ICD-10-CM | POA: Diagnosis present

## 2012-08-24 DIAGNOSIS — Z992 Dependence on renal dialysis: Secondary | ICD-10-CM

## 2012-08-24 DIAGNOSIS — I120 Hypertensive chronic kidney disease with stage 5 chronic kidney disease or end stage renal disease: Secondary | ICD-10-CM | POA: Diagnosis present

## 2012-08-24 DIAGNOSIS — Y95 Nosocomial condition: Secondary | ICD-10-CM | POA: Diagnosis present

## 2012-08-24 DIAGNOSIS — J189 Pneumonia, unspecified organism: Principal | ICD-10-CM | POA: Diagnosis present

## 2012-08-24 DIAGNOSIS — J9600 Acute respiratory failure, unspecified whether with hypoxia or hypercapnia: Secondary | ICD-10-CM | POA: Diagnosis present

## 2012-08-24 DIAGNOSIS — D631 Anemia in chronic kidney disease: Secondary | ICD-10-CM | POA: Diagnosis present

## 2012-08-24 DIAGNOSIS — S78119A Complete traumatic amputation at level between unspecified hip and knee, initial encounter: Secondary | ICD-10-CM

## 2012-08-24 DIAGNOSIS — J9601 Acute respiratory failure with hypoxia: Secondary | ICD-10-CM

## 2012-08-24 LAB — CBC WITH DIFFERENTIAL/PLATELET
Basophils Absolute: 0.1 10*3/uL (ref 0.0–0.1)
Basophils Relative: 1 % (ref 0–1)
Eosinophils Absolute: 0.1 10*3/uL (ref 0.0–0.7)
Eosinophils Relative: 1 % (ref 0–5)
HCT: 26.6 % — ABNORMAL LOW (ref 36.0–46.0)
MCH: 27.4 pg (ref 26.0–34.0)
MCHC: 32.7 g/dL (ref 30.0–36.0)
MCV: 83.9 fL (ref 78.0–100.0)
Monocytes Absolute: 0.4 10*3/uL (ref 0.1–1.0)
Monocytes Relative: 4 % (ref 3–12)
RDW: 14.5 % (ref 11.5–15.5)

## 2012-08-24 LAB — COMPREHENSIVE METABOLIC PANEL
AST: 19 U/L (ref 0–37)
Albumin: 3.2 g/dL — ABNORMAL LOW (ref 3.5–5.2)
Alkaline Phosphatase: 68 U/L (ref 39–117)
BUN: 14 mg/dL (ref 6–23)
Chloride: 94 mEq/L — ABNORMAL LOW (ref 96–112)
Potassium: 4.2 mEq/L (ref 3.5–5.1)
Sodium: 135 mEq/L (ref 135–145)
Total Bilirubin: 0.4 mg/dL (ref 0.3–1.2)
Total Protein: 7.5 g/dL (ref 6.0–8.3)

## 2012-08-24 LAB — EXPECTORATED SPUTUM ASSESSMENT W GRAM STAIN, RFLX TO RESP C

## 2012-08-24 LAB — BASIC METABOLIC PANEL
BUN: 53 mg/dL — ABNORMAL HIGH (ref 6–23)
Chloride: 86 mEq/L — ABNORMAL LOW (ref 96–112)
Creatinine, Ser: 10.14 mg/dL — ABNORMAL HIGH (ref 0.50–1.10)
GFR calc Af Amer: 4 mL/min — ABNORMAL LOW (ref >90)
Glucose, Bld: 158 mg/dL — ABNORMAL HIGH (ref 70–99)

## 2012-08-24 LAB — CG4 I-STAT (LACTIC ACID): Lactic Acid, Venous: 1.19 mmol/L (ref 0.5–2.2)

## 2012-08-24 LAB — TROPONIN I: Troponin I: <0.3 ng/mL (ref <0.30)

## 2012-08-24 MED ORDER — SODIUM CHLORIDE 0.9 % IJ SOLN
3.0000 mL | Freq: Two times a day (BID) | INTRAMUSCULAR | Status: DC
  Administered 2012-08-24 – 2012-08-25 (×3): 3 mL via INTRAVENOUS

## 2012-08-24 MED ORDER — LOSARTAN POTASSIUM 50 MG PO TABS
50.0000 mg | ORAL_TABLET | Freq: Every day | ORAL | Status: DC
  Administered 2012-08-24 – 2012-08-29 (×6): 50 mg via ORAL
  Filled 2012-08-24 (×7): qty 1

## 2012-08-24 MED ORDER — DEXTROSE 5 % IV SOLN
2.0000 g | INTRAVENOUS | Status: DC
  Administered 2012-08-26: 2 g via INTRAVENOUS
  Filled 2012-08-24 (×2): qty 2

## 2012-08-24 MED ORDER — VANCOMYCIN HCL 1000 MG IV SOLR
750.0000 mg | INTRAVENOUS | Status: DC
  Filled 2012-08-24 (×2): qty 750

## 2012-08-24 MED ORDER — METOPROLOL TARTRATE 1 MG/ML IV SOLN
5.0000 mg | Freq: Once | INTRAVENOUS | Status: AC
  Administered 2012-08-24: 5 mg via INTRAVENOUS
  Filled 2012-08-24: qty 5

## 2012-08-24 MED ORDER — VANCOMYCIN HCL 1000 MG IV SOLR
750.0000 mg | INTRAVENOUS | Status: DC
  Administered 2012-08-26: 750 mg via INTRAVENOUS
  Filled 2012-08-24: qty 750

## 2012-08-24 MED ORDER — SODIUM CHLORIDE 0.9 % IV SOLN: 250.0000 mL | INTRAVENOUS | Status: DC | PRN

## 2012-08-24 MED ORDER — VANCOMYCIN HCL 10 G IV SOLR
1250.0000 mg | Freq: Once | INTRAVENOUS | Status: AC
  Administered 2012-08-24: 1250 mg via INTRAVENOUS
  Filled 2012-08-24: qty 1250

## 2012-08-24 MED ORDER — LEVALBUTEROL HCL 0.63 MG/3ML IN NEBU
0.6300 mg | INHALATION_SOLUTION | Freq: Four times a day (QID) | RESPIRATORY_TRACT | Status: DC
  Administered 2012-08-24 (×2): 0.63 mg via RESPIRATORY_TRACT
  Filled 2012-08-24 (×4): qty 3

## 2012-08-24 MED ORDER — AMLODIPINE BESYLATE 10 MG PO TABS
10.0000 mg | ORAL_TABLET | Freq: Every day | ORAL | Status: DC
  Filled 2012-08-24: qty 1

## 2012-08-24 MED ORDER — LEVOFLOXACIN IN D5W 750 MG/150ML IV SOLN
750.0000 mg | INTRAVENOUS | Status: DC
  Administered 2012-08-24: 750 mg via INTRAVENOUS
  Filled 2012-08-24: qty 150

## 2012-08-24 MED ORDER — SODIUM CHLORIDE 0.9 % IJ SOLN: 3.0000 mL | INTRAMUSCULAR | Status: DC | PRN

## 2012-08-24 MED ORDER — FAMOTIDINE 20 MG PO TABS
20.0000 mg | ORAL_TABLET | Freq: Every day | ORAL | Status: DC
  Administered 2012-08-24 – 2012-08-29 (×6): 20 mg via ORAL
  Filled 2012-08-24 (×7): qty 1

## 2012-08-24 MED ORDER — GUAIFENESIN ER 600 MG PO TB12
600.0000 mg | ORAL_TABLET | Freq: Two times a day (BID) | ORAL | Status: DC
  Administered 2012-08-24 – 2012-08-30 (×12): 600 mg via ORAL
  Filled 2012-08-24 (×14): qty 1

## 2012-08-24 MED ORDER — DARBEPOETIN ALFA-POLYSORBATE 60 MCG/0.3ML IJ SOLN
INTRAMUSCULAR | Status: AC
  Administered 2012-08-24: 60 ug
  Filled 2012-08-24: qty 0.3

## 2012-08-24 MED ORDER — INSULIN ASPART 100 UNIT/ML ~~LOC~~ SOLN: 0.0000 [IU] | Freq: Three times a day (TID) | SUBCUTANEOUS | Status: DC

## 2012-08-24 MED ORDER — PARICALCITOL 5 MCG/ML IV SOLN
INTRAVENOUS | Status: AC
  Administered 2012-08-24: 17 ug
  Filled 2012-08-24: qty 4

## 2012-08-24 MED ORDER — INSULIN ASPART 100 UNIT/ML ~~LOC~~ SOLN: 0.0000 [IU] | Freq: Every day | SUBCUTANEOUS | Status: DC

## 2012-08-24 MED ORDER — DARBEPOETIN ALFA-POLYSORBATE 60 MCG/0.3ML IJ SOLN
60.0000 ug | INTRAMUSCULAR | Status: DC
  Filled 2012-08-24: qty 0.3

## 2012-08-24 MED ORDER — ONDANSETRON HCL 4 MG/2ML IJ SOLN
4.0000 mg | Freq: Four times a day (QID) | INTRAMUSCULAR | Status: DC | PRN
  Administered 2012-08-24 – 2012-08-27 (×4): 4 mg via INTRAVENOUS
  Filled 2012-08-24 (×4): qty 2

## 2012-08-24 MED ORDER — ACETAMINOPHEN 325 MG PO TABS
650.0000 mg | ORAL_TABLET | Freq: Four times a day (QID) | ORAL | Status: DC | PRN
  Administered 2012-08-24 – 2012-08-28 (×5): 650 mg via ORAL
  Filled 2012-08-24 (×4): qty 2

## 2012-08-24 MED ORDER — INSULIN GLARGINE 100 UNIT/ML ~~LOC~~ SOLN
16.0000 [IU] | Freq: Every day | SUBCUTANEOUS | Status: DC
  Administered 2012-08-24 – 2012-08-25 (×2): 16 [IU] via SUBCUTANEOUS

## 2012-08-24 MED ORDER — AMLODIPINE BESYLATE 10 MG PO TABS
10.0000 mg | ORAL_TABLET | Freq: Every day | ORAL | Status: DC
  Administered 2012-08-24 – 2012-08-29 (×6): 10 mg via ORAL
  Filled 2012-08-24 (×7): qty 1

## 2012-08-24 MED ORDER — CALCIUM ACETATE 667 MG PO CAPS: 1334.0000 mg | ORAL_CAPSULE | Freq: Three times a day (TID) | ORAL | Status: DC

## 2012-08-24 MED ORDER — CALCIUM ACETATE (PHOS BINDER) 667 MG/5ML PO SOLN
1334.0000 mg | Freq: Three times a day (TID) | ORAL | Status: DC
  Administered 2012-08-24 – 2012-08-26 (×4): 1334 mg via ORAL
  Filled 2012-08-24 (×20): qty 10

## 2012-08-24 MED ORDER — INSULIN ASPART 100 UNIT/ML ~~LOC~~ SOLN
0.0000 [IU] | Freq: Three times a day (TID) | SUBCUTANEOUS | Status: DC
  Administered 2012-08-25: 2 [IU] via SUBCUTANEOUS
  Administered 2012-08-25 – 2012-08-26 (×2): 1 [IU] via SUBCUTANEOUS

## 2012-08-24 MED ORDER — LEVOFLOXACIN IN D5W 500 MG/100ML IV SOLN
500.0000 mg | INTRAVENOUS | Status: DC
  Administered 2012-08-26 – 2012-08-28 (×2): 500 mg via INTRAVENOUS
  Filled 2012-08-24 (×2): qty 100

## 2012-08-24 MED ORDER — DEXTROSE 5 % IV SOLN
2.0000 g | INTRAVENOUS | Status: DC
  Administered 2012-08-24 – 2012-08-27 (×2): 2 g via INTRAVENOUS
  Filled 2012-08-24 (×2): qty 2

## 2012-08-24 MED ORDER — DEXTROSE 5 % IV SOLN
1.0000 g | Freq: Three times a day (TID) | INTRAVENOUS | Status: DC
  Administered 2012-08-24 (×2): 1 g via INTRAVENOUS
  Filled 2012-08-24 (×5): qty 1

## 2012-08-24 MED ORDER — CINACALCET HCL 30 MG PO TABS
60.0000 mg | ORAL_TABLET | Freq: Every day | ORAL | Status: DC
  Administered 2012-08-24 – 2012-08-30 (×7): 60 mg via ORAL
  Filled 2012-08-24 (×7): qty 2

## 2012-08-24 MED ORDER — INSULIN GLARGINE 100 UNIT/ML ~~LOC~~ SOLN: 8.0000 [IU] | Freq: Every day | SUBCUTANEOUS | Status: DC

## 2012-08-24 MED ORDER — PARICALCITOL 5 MCG/ML IV SOLN
17.0000 ug | INTRAVENOUS | Status: DC
  Administered 2012-08-26 – 2012-08-29 (×2): 17 ug via INTRAVENOUS
  Filled 2012-08-24 (×3): qty 3.4

## 2012-08-24 NOTE — ED Provider Notes (Signed)
History     CSN: [id number]  Arrival date & time 08/24/12  0038   First MD Initiated Contact with Patient 08/24/12 0040      Chief Complaint  Patient presents with  . Shortness of Breath    (Consider location/radiation/quality/duration/timing/severity/associated sxs/prior treatment) HPI 51 year old female presents to emergency room via EMS from home with complaint of worsening shortness of breath. Patient has history of severe hypertension and end-stage renal disease on dialysis Monday Tuesday Thursday Saturday. Patient reports she had normal run on Thursday. She began to become short of breath earlier in the day and worsened. She denies history of asthma or COPD. She denies any fever or chills. She feels as though she is fluid overloaded. Patient reports her normal blood pressure is two hundred over one 100s. She denies any chest pain. She has had some cough. No sick contacts. She does not normally wear oxygen  Past Medical History  Diagnosis Date  . Hypertension   . Anemia   . Systolic CHF, acute on chronic   . Coronary artery disease   . Peripheral vascular disease   . CHF (congestive heart failure)   . Angina   . Blood transfusion ~2003; 08/12/2012    "when I started on dialysis; lower gi bleeding" (08/12/2012)  . Hypercholesteremia   . DVT of leg (deep venous thrombosis) 2011    "RLE" (08/12/2012)  . Shortness of breath 01/02/12    "@rest ; lying down; w/exertion"; no change 08/12/2012  . Type II diabetes mellitus   . Acute lower GI bleeding 08/12/2012  . ESRD on dialysis     "[facility]; M, T, [place], [place]" (08/12/2012)    Past Surgical History  Procedure Date  . 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 Av fistula placement ~ 2003    left upper arm  . Abdominal hysterectomy 1990's  . Arteriovenous graft placement 2011    left upper arm  . Vascular surgery   . Cardiac catheterization ~ 2010  . Cesarean  section 1988  . Esophagogastroduodenoscopy 08/12/2012    Procedure: ESOPHAGOGASTRODUODENOSCOPY (EGD);  [place]: Vertell Novak., MD;  Location: [facility] ENDOSCOPY;  Service: Endoscopy;  Laterality: N/A;  . Colonoscopy 08/13/2012    Procedure: COLONOSCOPY;  [place]: Vertell Novak., MD;  Location: [facility] ENDOSCOPY;  Service: Endoscopy;  Laterality: N/A;    Family History  Problem Relation Age of Onset  . Breast cancer Sister   . Breast cancer Sister     History  Substance Use Topics  . Smoking status: Never Smoker   . Smokeless tobacco: Never Used  . Alcohol Use: No    OB History    Grav Para Term Preterm Abortions TAB SAB Ect Mult Living                  Review of Systems  See History of Present Illness; otherwise all other systems are reviewed and negative  Allergies  Review of patient's allergies indicates no known allergies.  Home Medications   Current Outpatient Rx  Name  Route  Sig  Dispense  Refill  . AMLODIPINE BESYLATE 10 MG PO TABS   Oral   Take 10 mg by mouth at bedtime.          Marland Kitchen CALCIUM ACETATE 667 MG PO CAPS   Oral   Take 1,334 mg by mouth 3 (three) times daily with meals.         Marland Kitchen  CINACALCET HCL 60 MG PO TABS   Oral   Take 60 mg by mouth daily.         Marland Kitchen FAMOTIDINE 20 MG PO TABS   Oral   Take 20 mg by mouth at bedtime. One at bedtime         . INSULIN ASPART 100 UNIT/ML [place] SOLN   Subcutaneous   Inject 1-5 Units into the skin 3 (three) times daily before meals. Sliding scale         . INSULIN GLARGINE 100 UNIT/ML [place] SOLN   Subcutaneous   Inject 16 Units into the skin at bedtime.   10 mL   0   . LOSARTAN POTASSIUM 50 MG PO TABS   Oral   Take 50 mg by mouth at bedtime.         Marland Kitchen ONDANSETRON 4 MG PO TBDP   Oral   Take 1 tablet (4 mg total) by mouth every 8 (eight) hours as needed for nausea.   20 tablet   0     BP 207/97  Pulse 117  Temp 98 F (36.7 C) (Oral)  Resp 29  SpO2 90%  Physical Exam  Nursing note and  vitals reviewed. Constitutional: She is oriented to person, place, and time.       Patient noted to have oxygen levels of 88-90 on room air. 100% with 2 L. Patient is ill-appearing,  Tachypnea and uncomfortable  HENT:  Head: Normocephalic and atraumatic.  Nose: Nose normal.  Mouth/Throat: Oropharynx is clear and moist.  Neck: Normal range of motion. Neck supple. No JVD present. No tracheal deviation present. No thyromegaly present.  Pulmonary/Chest: No stridor. She is in respiratory distress (upper respiratory distress with tachypnea). She has no wheezes. She has no rales. She exhibits no tenderness.  Abdominal: Soft. Bowel sounds are normal. She exhibits no distension and no mass. There is no tenderness. There is no rebound and no guarding.  Musculoskeletal:       Leg and dictation bilaterally noted. AV shunt in left upper arm pulsating  Lymphadenopathy:    She has no cervical adenopathy.  Neurological: She is alert and oriented to person, place, and time. Coordination normal.  Skin: Skin is warm and dry. No rash noted. No erythema. No pallor.    ED Course  Procedures (including critical care time)  CRITICAL CARE Performed by: Olivia Mackie   Total critical care time: 45 min  Critical care time was exclusive of separately billable procedures and treating other patients.  Critical care was necessary to treat or prevent imminent or life-threatening deterioration.  Critical care was time spent personally by me on the following activities: development of treatment plan with patient and/or surrogate as well as nursing, discussions with consultants, evaluation of patient's response to treatment, examination of patient, obtaining history from patient or surrogate, ordering and performing treatments and interventions, ordering and review of laboratory studies, ordering and review of radiographic studies, pulse oximetry and re-evaluation of patient's condition.   Labs Reviewed  CBC WITH  DIFFERENTIAL - Abnormal; Notable for the following:    RBC 3.17 (*)     Hemoglobin 8.7 (*)     HCT 26.6 (*)     Neutrophils Relative 82 (*)     Neutro Abs 8.0 (*)     All other components within normal limits  CG4 I-STAT (LACTIC ACID)  PRO B NATRIURETIC PEPTIDE  BASIC METABOLIC PANEL  TROPONIN I  CULTURE, BLOOD (SINGLE)   Dg Chest [place]  1 View  08/24/2012  *RADIOLOGY REPORT*  Clinical Data: Short of breath.  CHF.  End-stage renal disease. Diabetic.  PORTABLE CHEST - 1 VIEW  Comparison: None.  Findings: Right middle lobe consolidation is present, respecting minor fissure.  Cardiomegaly.  Interstitial pulmonary edema is present. Monitoring leads are projected over the chest.  Aortic arch atherosclerosis.  IMPRESSION:  1.  Right middle lobe consolidation, most compatible with pneumonia.  Asymmetric/atypical pulmonary edema considered less likely. 2.  Mild to moderate CHF with interstitial pulmonary edema diffusely and cardiomegaly.   Original Report Authenticated By: Andreas Newport, M.D.     Date: 08/24/2012  Rate: 115  Rhythm: sinus tachycardia  QRS Axis: normal  Intervals: normal  ST/T Wave abnormalities: ST depressions diffusely and t wave inversions inferiolaterally  Conduction Disutrbances:none  Narrative Interpretation:   Old EKG Reviewed: unchanged    1. Healthcare-associated pneumonia   2. HTN (hypertension)-accelerated/uncontrolled at admission   3. End stage renal disease on dialysis   4. Acute respiratory failure with hypoxia   5. ESRD on hemodialysis   6. Pulmonary edema with congestive heart failure with reduced left ventricular function       MDM  51 year old female with right middle lobe infection. She has care associated pneumonia area she is hypoxic, tachypneic and tachycardic. Feel she would best benefit from inpatient admission and IV antibiotics with dialysis in the morning.  Pt with persistent sob, increased WOB.  Requesting Bipap.  Will start on bipap for  sob, fatigue.  D/w hospitalist for admission.        Olivia Mackie, MD 08/24/12 [phone]

## 2012-08-24 NOTE — Consult Note (Signed)
[facility] Renal Consultation Note    Indication for Consultation:  Management of ESRD/hemodialysis; anemia, hypertension/volume and secondary hyperparathyroidism  HPI: [name] is a 51 y.o. female with ESRD secondary to DM on MondayTTS HD at [place] [facility] since 2005 who presented with dry cough progressing to SOB over the past few days. She denies fever or chills prior to admission, but is having chills on dialysis.  She had some diarrhea several weeks ago with blood, but that resolved.  She has been anorexic and did not eat anything the day prior to admission. She says she goes 4 x per week and doesn't miss (although has missed an occassional treatment and signed off early).  She is tearful and tired of going to dialysis.   Past Medical History  Diagnosis Date  . Hypertension   . Anemia   . Systolic CHF, acute on chronic   . Coronary artery disease   . Peripheral vascular disease   . CHF (congestive heart failure)   . Angina   . Blood transfusion ~2003; 08/12/2012    "when I started on dialysis; lower gi bleeding" (08/12/2012)  . Hypercholesteremia   . DVT of leg (deep venous thrombosis) 2011    "RLE" (08/12/2012)  . Shortness of breath 01/02/12    "@rest ; lying down; w/exertion"; no change 08/12/2012  . Type II diabetes mellitus   . Acute lower GI bleeding 08/12/2012  . ESRD on dialysis     "[facility] M, T, [place], [place]" (08/12/2012)   Past Surgical History  Procedure Date  . 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 Av fistula placement ~ 2003    left upper arm  . Abdominal hysterectomy 1990's  . Arteriovenous graft placement 2011    left upper arm  . Vascular surgery   . Cardiac catheterization ~ 2010  . Cesarean section 1988  . Esophagogastroduodenoscopy 08/12/2012    Procedure: ESOPHAGOGASTRODUODENOSCOPY (EGD);  [place]: Vertell Novak., MD;  Location: [facility]  ENDOSCOPY;  Service: Endoscopy;  Laterality: N/A;  . Colonoscopy 08/13/2012    Procedure: COLONOSCOPY;  [place]: Vertell Novak., MD;  Location: [facility] ENDOSCOPY;  Service: Endoscopy;  Laterality: N/A;   Family History  Problem Relation Age of Onset  . Breast cancer Sister   . Breast cancer Sister   . Kidney failure Mother   . Diabetes Mother   . Cancer - Other Father    Social History:  reports that she has never smoked. She has never used smokeless tobacco. She reports that she does not drink alcohol or use illicit drugs. No Known Allergies Prior to Admission medications   Medication Sig Start Date End Date Taking? Authorizing Provider  amLODipine (NORVASC) 10 MG tablet Take 10 mg by mouth at bedtime.    Yes Historical Provider, MD  calcium acetate (PHOSLO) 667 MG capsule Take 1,334 mg by mouth 3 (three) times daily with meals.   Yes Historical Provider, MD  cinacalcet (SENSIPAR) 60 MG tablet Take 60 mg by mouth daily.   Yes Historical Provider, MD  famotidine (PEPCID) 20 MG tablet Take 20 mg by mouth at bedtime. One at bedtime 05/01/12 05/01/13 Yes Nyoka Cowden, MD  insulin aspart (NOVOLOG) 100 UNIT/ML injection Inject 1-5 Units into the skin 3 (three) times daily before meals. Sliding scale   Yes Historical Provider, MD  insulin glargine (LANTUS) 100 UNIT/ML injection Inject  16 Units into the skin at bedtime. 08/14/12  Yes Penny Pia, MD  losartan (COZAAR) 50 MG tablet Take 50 mg by mouth at bedtime.   Yes Historical Provider, MD  ondansetron (ZOFRAN ODT) 4 MG disintegrating tablet Take 1 tablet (4 mg total) by mouth every 8 (eight) hours as needed for nausea. 08/14/12  Yes Penny Pia, MD   Current Facility-Administered Medications  Medication Dose Route Frequency Provider Last Rate Last Dose  . ceFEPIme (MAXIPIME) 1 g in dextrose 5 % 50 mL IVPB  1 g Intravenous Q8H Olivia Mackie, MD 100 mL/hr at 08/24/12 0716 1 g at 08/24/12 0716  . darbepoetin (ARANESP) injection 60 mcg  60 mcg  Intravenous Q Sat-HD Sadie Haber, MD      . paricalcitol [facility]) injection 17 mcg  17 mcg Intravenous Q T,Th,Sa-HD Sadie Haber, MD       Labs: Basic Metabolic Panel:  Lab 08/24/12 1610  NA 130*  K 3.8  CL 86*  CO2 23  GLUCOSE 158*  BUN 53*  CREATININE 10.14*  CALCIUM 9.8  ALB --  PHOS --  CBC:  Lab 08/24/12 0155  WBC 9.7  NEUTROABS 8.0*  HGB 8.7*  HCT 26.6*  MCV 83.9  PLT 295   Cardiac Enzymes:  Lab 08/24/12 0151  CKTOTAL --  CKMB --  CKMBINDEX --  TROPONINI <0.30  Studies/Results: Dg Chest Port 1 View  08/24/2012  *RADIOLOGY REPORT*  Clinical Data: Short of breath.  CHF.  End-stage renal disease. Diabetic.  PORTABLE CHEST - 1 VIEW  Comparison: None.  Findings: Right middle lobe consolidation is present, respecting minor fissure.  Cardiomegaly.  Interstitial pulmonary edema is present. Monitoring leads are projected over the chest.  Aortic arch atherosclerosis.  IMPRESSION:  1.  Right middle lobe consolidation, most compatible with pneumonia.  Asymmetric/atypical pulmonary edema considered less likely. 2.  Mild to moderate CHF with interstitial pulmonary edema diffusely and cardiomegaly.   Original Report Authenticated By: Andreas Newport, M.D.    ROS: Limited by being on Bipap while on dialysis -   Physical Exam: Filed Vitals:   08/24/12 0900 08/24/12 0930 08/24/12 1000 08/24/12 1030  BP: 179/95 190/102 141/93 148/104  Pulse: 104 119 107 111  Temp:      TempSrc:      Resp: 20 38 26 30  SpO2: 100% 100% 100% 100%     General: Acutely ill with Bipap -speaking in whisper difficult to understand SOB wrapped up in layers of blankets; shaking Head: Normocephalic, atraumatic, sclera non-icteric Neck: Supple.  Lungs:  Difficult to examine due to being on HD/Bipap shaking; no overt rhonchi wheezing or rales Heart: tachycardia Abdomen: Soft, non-tender, non-distended with normoactive bowel sounds. No rebound/guarding. No obvious abdominal masses. Lower  extremities: bilateral amputee; left BKA right AKA;  no edema, well healed Neuro: Alert and oriented X 3. Moves all extremities spontaneously. Dialysis Access:left AVF Qb 400  Dialysis Orders: Center: [place]  on TTS EDW 61 HD Bath   Time 4 Heparin- "tight" exact dose not available - was to have increased to 3000 12/26 . Access left AVF400/600Zemplar 18 mcg IV/HD Epogen 10K   Units IV/HD  Venofer none- ferritin last 1100 in November  Assessment/Plan: 1.  SOB secondary to PNA and pulmonary edema - on HD with BIpap with goal of 3.5; ran full treatment 12/19 with low fluid gain and almost got to EDW. Evaluate post HD and try to wean Bipap.  On empiric maxipime and Vanc  I  suspect she has lost some weight and needs EDW lowered.  Recheck CXR later today; may do HD again Sunday - to be determined. 2.  ESRD -  MonTTS - clotted system on HD today - and clotted system at outpt center 12/17 noted by Dr. Kathrene Bongo; received 3500 heparin today. VP not high - watch; actually getting less heparin at her HD center 3.  Hypertension/volume  - Elevated at outpt unit; on norvasc 10 and benicar 40 - norvasc listed for admission; decrease volume will help BP 4.  Anemia  - Hgb 8.7; last Hgb 9.2 12/12;  Epo 10K since 12/12 without increase in Hgb; if Hgb does not increase with volume removal, will increase Aranesp dose; received 60 today. 5.  Metabolic bone disease -  Continue zemplar 18; takes sensipar 60 and 10 phoslyra ac at home - will adjust meds when orders released. 6.  Nutrition - will need renal diet 7.  DM continue Lantus and SSI 8. PVD -bilateral amputee stable  Sheffield Slider, PA-C [facility] Beeper [phone] 08/24/2012, 10:49 AM   Patient seen and examined and agree with assessment and plan as above. 51 yo with HTN, CAD, PVD w bilat LE amputation and ESRD admitted with SOB over 2-3 duration. CXR showed pulm edema AND a dense focal RLL infiltrated. Admitted and getting IV abx for  suspected PNA, and dialysis for probable volume overload. See also rec's above.  Vinson Moselle  MD [facility] [phone] pgr    [phone] cell 08/24/2012, 2:04 PM

## 2012-08-24 NOTE — Procedures (Signed)
I have personally attended this patient's dialysis session.  Initiating dialysis via left upper arm access.  Pt admitted with pneumonia and pulmonary edema.   On BIPAP.  BP 140's.   Dry weight is 61 but unable to weigh so will use goal of 3 liters based on current exam and chest x ray.   Plan  4 1/2 hours TMT, 4K bath due to K of 3.8, usual zemplar (18 mcg), Aranesp 60 for usual EPO of 6400/TMT.   Zelpha Messing B

## 2012-08-24 NOTE — H&P (Signed)
PCP: NOne  Renal:  Cecille Aver, MD    Chief Complaint:   Shortness of breath  HPI: [name] is a 51 y.o. female   has a past medical history of Hypertension; Anemia; Systolic CHF, acute on chronic; Coronary artery disease; Peripheral vascular disease; CHF (congestive heart failure); Angina; Blood transfusion (~2003; 08/12/2012); Hypercholesteremia; DVT of leg (deep venous thrombosis) (2011); Shortness of breath (01/02/12); Type II diabetes mellitus; Acute lower GI bleeding (08/12/2012); and ESRD on dialysis.   Presented with  Patient was admtitted to [facility] with GI bleed 1 and a half weeks ago. This week she started to have a cough  And this progressed to shortness of breath over the past 3 days. Patient denies any fever or chills. Dry cough non production. No chest pain. She presented to ER and was found to have likely PNA on CXR. Due to increased work of breathing she was put on Bipap and now doing better.  She reports getting HD on Monday, Tuesdays, Thursdays and Saturdays. Last HD was on Thursday and went well. She has Left arm graft. She reports still making some urine once a week.  Review of Systems:     Pertinent positives include: shortness of breath at rest. non-productive cough,  Constitutional:  No weight loss, night sweats, Fevers, chills, fatigue, weight loss  HEENT:  No headaches, Difficulty swallowing,Tooth/dental problems,Sore throat,  No sneezing, itching, ear ache, nasal congestion, post nasal drip,  Cardio-vascular:  No chest pain, Orthopnea, PND, anasarca, dizziness, palpitations.no Bilateral lower extremity swelling  GI:  No heartburn, indigestion, abdominal pain, nausea, vomiting, diarrhea, change in bowel habits, loss of appetite, melena, blood in stool, hematemesis Resp:   No dyspnea on exertion, No excess mucus,   No coughing up of blood.No change in color of mucus.No wheezing. Skin:  no rash or lesions. No jaundice GU:  no dysuria, change in  color of urine, no urgency or frequency. No straining to urinate.  No flank pain.  Musculoskeletal:  No joint pain or no joint swelling. No decreased range of motion. No back pain.  Psych:  No change in mood or affect. No depression or anxiety. No memory loss.  Neuro: no localizing neurological complaints, no tingling, no weakness, no double vision, no gait abnormality, no slurred speech, no confusion  Otherwise ROS are negative except for above, 10 systems were reviewed  Past Medical History: Past Medical History  Diagnosis Date  . Hypertension   . Anemia   . Systolic CHF, acute on chronic   . Coronary artery disease   . Peripheral vascular disease   . CHF (congestive heart failure)   . Angina   . Blood transfusion ~2003; 08/12/2012    "when I started on dialysis; lower gi bleeding" (08/12/2012)  . Hypercholesteremia   . DVT of leg (deep venous thrombosis) 2011    "RLE" (08/12/2012)  . Shortness of breath 01/02/12    "@rest ; lying down; w/exertion"; no change 08/12/2012  . Type II diabetes mellitus   . Acute lower GI bleeding 08/12/2012  . ESRD on dialysis     "[facility]; M, T, [place], [place]" (08/12/2012)   Past Surgical History  Procedure Date  . 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 Av fistula placement ~ 2003    left upper arm  . Abdominal hysterectomy 1990's  . Arteriovenous graft placement 2011    left upper arm  .  Vascular surgery   . Cardiac catheterization ~ 2010  . Cesarean section 1988  . Esophagogastroduodenoscopy 08/12/2012    Procedure: ESOPHAGOGASTRODUODENOSCOPY (EGD);  [place]: Vertell Novak., MD;  Location: [facility] ENDOSCOPY;  Service: Endoscopy;  Laterality: N/A;  . Colonoscopy 08/13/2012    Procedure: COLONOSCOPY;  [place]: Vertell Novak., MD;  Location: [facility] ENDOSCOPY;  Service: Endoscopy;  Laterality: N/A;     Medications: Prior to Admission medications   Medication Sig Start  Date End Date Taking? Authorizing Provider  amLODipine (NORVASC) 10 MG tablet Take 10 mg by mouth at bedtime.    Yes Historical Provider, MD  calcium acetate (PHOSLO) 667 MG capsule Take 1,334 mg by mouth 3 (three) times daily with meals.   Yes Historical Provider, MD  cinacalcet (SENSIPAR) 60 MG tablet Take 60 mg by mouth daily.   Yes Historical Provider, MD  famotidine (PEPCID) 20 MG tablet Take 20 mg by mouth at bedtime. One at bedtime 05/01/12 05/01/13 Yes Nyoka Cowden, MD  insulin aspart (NOVOLOG) 100 UNIT/ML injection Inject 1-5 Units into the skin 3 (three) times daily before meals. Sliding scale   Yes Historical Provider, MD  insulin glargine (LANTUS) 100 UNIT/ML injection Inject 16 Units into the skin at bedtime. 08/14/12  Yes Penny Pia, MD  losartan (COZAAR) 50 MG tablet Take 50 mg by mouth at bedtime.   Yes Historical Provider, MD  ondansetron (ZOFRAN ODT) 4 MG disintegrating tablet Take 1 tablet (4 mg total) by mouth every 8 (eight) hours as needed for nausea. 08/14/12  Yes Penny Pia, MD    Allergies:  No Known Allergies  Social History:  Ambulatory weal chair bound secondary to Left BKA and Right AKA Lives at   Home with Husband   reports that she has never smoked. She has never used smokeless tobacco. She reports that she does not drink alcohol or use illicit drugs.   Family History: family history includes Breast cancer in her sisters; Cancer - Other in her father; Diabetes in her mother; and Kidney failure in her mother.    Physical Exam: Patient Vitals for the past 24 hrs:  BP Temp Temp src Pulse Resp SpO2  08/24/12 0245 191/94 mmHg - - 125  33  98 %  08/24/12 0230 201/96 mmHg - - 120  26  98 %  08/24/12 0215 197/93 mmHg - - 119  34  97 %  08/24/12 0115 183/78 mmHg - - 115  30  100 %  08/24/12 0100 175/87 mmHg - - 118  29  99 %  08/24/12 0045 207/97 mmHg 98 F (36.7 C) Oral 117  29  90 %    1. General:  in No Acute distress 2. Psychological: Alert and  Oriented 3. Head/ENT:   Moist   Mucous Membranes                          Head Non traumatic, neck supple                          Normal   Dentition 4. SKIN: normal   Skin turgor,  Skin clean Dry and intact no rash 5. Heart: Regular rate and rhythm no Murmur, Rub or gallop 6. Lungs: BiPAP is on which is interfering with exam but no crackle could be detected 7. Abdomen: Soft, non-tender, Non distended 8. Lower extremities: Right AKA and Left BKA both stumps healed well. 9.  Neurologically Grossly intact, moving all 4 extremities equally 10. MSK: Normal range of motion  body mass index is unknown because there is no height or weight on file.   Labs on Admission:   [facility] 08/24/12 0155  NA 130*  K 3.8  CL 86*  CO2 23  GLUCOSE 158*  BUN 53*  CREATININE 10.14*  CALCIUM 9.8  MG --  PHOS --   No results found for this basename: AST:2,ALT:2,ALKPHOS:2,BILITOT:2,PROT:2,ALBUMIN:2 in the last 72 hours No results found for this basename: LIPASE:2,AMYLASE:2 in the last 72 hours  Basename 08/24/12 0155  WBC 9.7  NEUTROABS 8.0*  HGB 8.7*  HCT 26.6*  MCV 83.9  PLT 295    Basename 08/24/12 0151  CKTOTAL --  CKMB --  CKMBINDEX --  TROPONINI <0.30   No results found for this basename: TSH,T4TOTAL,FREET3,T3FREE,THYROIDAB in the last 72 hours No results found for this basename: VITAMINB12:2,FOLATE:2,FERRITIN:2,TIBC:2,IRON:2,RETICCTPCT:2 in the last 72 hours Lab Results  Component Value Date   HGBA1C 9.1* 08/12/2012    The CrCl is unknown because both a height and weight (above a minimum accepted value) are required for this calculation. ABG    Component Value Date/Time   PHART 7.371 06/20/2011 0002   HCO3 25.0* 06/20/2011 0002   TCO2 14 01/08/2012 0832   O2SAT 97.0 06/20/2011 0002    Other results:  I have pearsonaly reviewed this: ECG REPORT  Rate: 115  Rhythm: Sinus Tachycardia ST&T Change: no change from prior     Cultures:    Component Value Date/Time   SDES  BLOOD RIGHT HAND 06/19/2011 2345   SPECREQUEST BOTTLES DRAWN AEROBIC ONLY 4CC 06/19/2011 2345   CULT NO GROWTH 5 DAYS 06/19/2011 2345   REPTSTATUS 06/26/2011 FINAL 06/19/2011 2345       Radiological Exams on Admission: Dg Chest Port 1 View  08/24/2012  *RADIOLOGY REPORT*  Clinical Data: Short of breath.  CHF.  End-stage renal disease. Diabetic.  PORTABLE CHEST - 1 VIEW  Comparison: None.  Findings: Right middle lobe consolidation is present, respecting minor fissure.  Cardiomegaly.  Interstitial pulmonary edema is present. Monitoring leads are projected over the chest.  Aortic arch atherosclerosis.  IMPRESSION:  1.  Right middle lobe consolidation, most compatible with pneumonia.  Asymmetric/atypical pulmonary edema considered less likely. 2.  Mild to moderate CHF with interstitial pulmonary edema diffusely and cardiomegaly.   Original Report Authenticated By: Andreas Newport, M.D.     Chart has been reviewed  Assessment/Plan  51 yo w respiratory distress with HAP and some evidence of fluid overload.   Present on Admission:  . HAP (hospital-acquired pneumonia) - admit to stepdown on BiPAP, triple antibiotic therapy, will obtain sputum and blood cultures.  . Pulmonary edema with congestive heart failure with reduced left ventricular function - in the setting of ESRD, will get HD in AM which should help with fluid overload.  Marland Kitchen HTN (hypertension)-accelerated/uncontrolled at admission - continue home medications, HD also should help with blood pressure management.  . DM (diabetes mellitus) - SSI and continue home dose of lantus, would watch for hypoglycemia . Anemia - stable, likely anemia, continue to monitor not endorsing further GI bleeding episodes.  . Acute respiratory failure with hypoxia - on BiPAP  Now improving will watch in stepdown, currently sating 100%    Prophylaxis: SCD Protonix  CODE STATUS: FULL CODE  Other plan as per orders.  I have spent a total of 55 min on this  admission  Myer Bohlman 08/24/2012, 4:33 AM

## 2012-08-24 NOTE — ED Notes (Signed)
Pt taken to dialysis. Report given to [facility].

## 2012-08-24 NOTE — Progress Notes (Signed)
Call placed to Dr. Jomarie Longs informed patient heart rate is 130-140 sustain, new orders received.

## 2012-08-24 NOTE — ED Notes (Signed)
Pt placed on BiPAP

## 2012-08-24 NOTE — ED Notes (Signed)
Patient complaining about mask. Called respiratory. Respiratory at bedside.

## 2012-08-24 NOTE — ED Notes (Signed)
Admitting at bedside 

## 2012-08-24 NOTE — ED Notes (Signed)
Per EMS, SOB started a few hours ago. She has been having a nonproductive cough. Hx of CHF, but no rale or ronchi. Lung sounds clear. No fever, No chest pain, No n/v.  100%, 198/106, CBG 152, Hr 112, Resp 18. No IV. Pt is a dialysis pt. Last treatment Thursday. Full treatment.

## 2012-08-24 NOTE — ED Notes (Signed)
Report given to Crystal RN 

## 2012-08-24 NOTE — ED Notes (Signed)
IV Team made aware.

## 2012-08-24 NOTE — Progress Notes (Signed)
Placed patient on Bipap 12/6 rate of 8 with 35% Fi02 due to increased WOB.

## 2012-08-24 NOTE — ED Notes (Signed)
IV Team Paged. Dr. Norlene Campbell aware of elevated BP.

## 2012-08-24 NOTE — Progress Notes (Signed)
Dr. Jomarie Longs notified patient's temp 102.2, tylenol 650 mg given po per MD order.

## 2012-08-24 NOTE — ED Notes (Signed)
IV Team at bedside 

## 2012-08-24 NOTE — Progress Notes (Signed)
ANTIBIOTIC CONSULT NOTE - INITIAL  Pharmacy Consult for Levofloxacin Indication: HAP  No Known Allergies  Patient Measurements: Weight:  (pt on stretcher) Adjusted Body Weight:   Vital Signs: Temp: 99.9 F (37.7 C) (12/21 1355) Temp src: Oral (12/21 1355) BP: 160/97 mmHg (12/21 1355) Pulse Rate: 136  (12/21 1425) Intake/Output from previous day:   Intake/Output from this shift: Total I/O In: -  Out: 3170 [Other:3170]  Labs:  [facility] 08/24/12 0155  WBC 9.7  HGB 8.7*  PLT 295  LABCREA --  CREATININE 10.14*   The CrCl is unknown because both a height and weight (above a minimum accepted value) are required for this calculation. No results found for this basename: VANCOTROUGH:2,VANCOPEAK:2,VANCORANDOM:2,GENTTROUGH:2,GENTPEAK:2,GENTRANDOM:2,TOBRATROUGH:2,TOBRAPEAK:2,TOBRARND:2,AMIKACINPEAK:2,AMIKACINTROU:2,AMIKACIN:2, in the last 72 hours   Microbiology: No results found for this or any previous visit (from the past 720 hour(s)).  Medical History: Past Medical History  Diagnosis Date  . Hypertension   . Anemia   . Systolic CHF, acute on chronic   . Coronary artery disease   . Peripheral vascular disease   . CHF (congestive heart failure)   . Angina   . Blood transfusion ~2003; 08/12/2012    "when I started on dialysis; lower gi bleeding" (08/12/2012)  . Hypercholesteremia   . DVT of leg (deep venous thrombosis) 2011    "RLE" (08/12/2012)  . Shortness of breath 01/02/12    "@rest ; lying down; w/exertion"; no change 08/12/2012  . Type II diabetes mellitus   . Acute lower GI bleeding 08/12/2012  . ESRD on dialysis     "[facility], TTS hemodialysis    Medications:  Scheduled:    . amLODipine  10 mg Oral QHS  . calcium acetate (Phos Binder)  1,334 mg Oral TID WC  . ceFEPime (MAXIPIME) IV  1 g Intravenous Q8H  . cinacalcet  60 mg Oral Daily  . [COMPLETED] darbepoetin      . darbepoetin (ARANESP) injection - DIALYSIS  60 mcg Intravenous Q Sat-HD  . famotidine  20  mg Oral QHS  . insulin aspart  0-5 Units Subcutaneous QHS  . insulin aspart  0-9 Units Subcutaneous TID WC  . insulin aspart  0-9 Units Subcutaneous TID WC  . insulin glargine  16 Units Subcutaneous QHS  . levalbuterol  0.63 mg Nebulization Q6H  . losartan  50 mg Oral QHS  . metoprolol  5 mg Intravenous Once  . [COMPLETED] paricalcitol      . paricalcitol  17 mcg Intravenous Q T,Th,Sa-HD  . sodium chloride  3 mL Intravenous Q12H  . [COMPLETED] vancomycin  1,250 mg Intravenous Once  . vancomycin  750 mg Intravenous Q T,Th,Sa-HD  . [DISCONTINUED] amLODipine  10 mg Oral Daily  . [DISCONTINUED] calcium acetate  1,334 mg Oral TID WC  . [DISCONTINUED] insulin glargine  8 Units Subcutaneous QHS  . [DISCONTINUED] levofloxacin (LEVAQUIN) IV  750 mg Intravenous Q24H   Assessment: 51yo female with ESRD on HD Mon,Tues,Thurs, & Sat.  She is on Vancomycin and Cefepime, with Levofloxacin to be added for coverage of HAP.  Current Cefepime dosing needs adjusting for ESRD; she received 1st dose prior to HD today.  Goal of Therapy:  Resolution of infection  Plan:  1.  Levofloxacin 500mg  IV q48, next dose 12/23 at 2400 2.  Change Cefepime to 2gm IV qHD- give another dose today 3.  Plan steady state Vanc level when appropriate 4.  F/U culture results  Marisue Humble, PharmD Clinical Pharmacist [facility]

## 2012-08-24 NOTE — Progress Notes (Signed)
[facility]             Progress Note   Subjective: Having chills, seen on HD, feels bad, starting having cough and SoB since yetserday  Objective: Vital signs in last 24 hours: Temp:  [98 F (36.7 C)-99.9 F (37.7 C)] 99.9 F (37.7 C) (12/21 1355) Pulse Rate:  [104-136] 136  (12/21 1425) Resp:  [20-39] 33  (12/21 1220) BP: (116-207)/(78-106) 160/97 mmHg (12/21 1355) SpO2:  [90 %-100 %] 97 % (12/21 1425) FiO2 (%):  [3 %] 3 % (12/21 1220) Weight change:     Intake/Output from previous day:   Total I/O In: -  Out: 3170 [Other:3170]   Physical Exam: General: Alert, awake, ill appearing, oriented x3, with chills. HEENT: No bruits, no goiter. Heart: Regular rate and rhythm, without murmurs, rubs, gallops. Lungs: scattered ronchi at bases Abdomen: Soft, nontender, nondistended, positive bowel sounds. Extremities: bilateral amputee; left BKA right AKA; no edema, well healed Neuro: Grossly intact, nonfocal.    Lab Results: Basic Metabolic Panel:  Basename 08/24/12 0155  NA 130*  K 3.8  CL 86*  CO2 23  GLUCOSE 158*  BUN 53*  CREATININE 10.14*  CALCIUM 9.8  MG --  PHOS --   Liver Function Tests: No results found for this basename: AST:2,ALT:2,ALKPHOS:2,BILITOT:2,PROT:2,ALBUMIN:2 in the last 72 hours No results found for this basename: LIPASE:2,AMYLASE:2 in the last 72 hours No results found for this basename: AMMONIA:2 in the last 72 hours CBC:  Basename 08/24/12 0155  WBC 9.7  NEUTROABS 8.0*  HGB 8.7*  HCT 26.6*  MCV 83.9  PLT 295   Cardiac Enzymes:  Basename 08/24/12 0151  CKTOTAL --  CKMB --  CKMBINDEX --  TROPONINI <0.30   BNP: No results found for this basename: PROBNP:3 in the last 72 hours D-Dimer: No results found for this basename: DDIMER:2 in the last 72 hours CBG:  Basename 08/24/12 1125  GLUCAP 105*   Hemoglobin A1C: No results found for this basename: HGBA1C in the last 72 hours Fasting Lipid Panel: No results  found for this basename: CHOL,HDL,LDLCALC,TRIG,CHOLHDL,LDLDIRECT in the last 72 hours Thyroid Function Tests: No results found for this basename: TSH,T4TOTAL,FREET4,T3FREE,THYROIDAB in the last 72 hours Anemia Panel: No results found for this basename: VITAMINB12,FOLATE,FERRITIN,TIBC,IRON,RETICCTPCT in the last 72 hours Coagulation: No results found for this basename: LABPROT:2,INR:2 in the last 72 hours Urine Drug Screen: Drugs of Abuse     Component Value Date/Time   LABOPIA NONE DETECTED 06/23/2008 0529   COCAINSCRNUR NONE DETECTED 06/23/2008 0529   LABBENZ NONE DETECTED 06/23/2008 0529   AMPHETMU NONE DETECTED 06/23/2008 0529   THCU NONE DETECTED 06/23/2008 0529   LABBARB  Value: NONE DETECTED        DRUG SCREEN FOR MEDICAL PURPOSES ONLY.  IF CONFIRMATION IS NEEDED FOR ANY PURPOSE, NOTIFY LAB WITHIN 5 DAYS. 06/23/2008 0529    Alcohol Level: No results found for this basename: ETH:2 in the last 72 hours Urinalysis: No results found for this basename: COLORURINE:2,APPERANCEUR:2,LABSPEC:2,PHURINE:2,GLUCOSEU:2,HGBUR:2,BILIRUBINUR:2,KETONESUR:2,PROTEINUR:2,UROBILINOGEN:2,NITRITE:2,LEUKOCYTESUR:2 in the last 72 hours  No results found for this or any previous visit (from the past 240 hour(s)).  Studies/Results: Dg Chest Port 1 View  08/24/2012  *RADIOLOGY REPORT*  Clinical Data: Follow up pneumonia  PORTABLE CHEST - 1 VIEW  Comparison: 09/25/2011 01:10 hours  Findings: There is improved aeration in the right middle lobe with slight improvement of the infiltrates seen.  There remains mild edema bilaterally.  Cardiac shadow is stable.  No new focal abnormality is seen.  IMPRESSION:  Improved aeration in the right middle lobe.   Original Report Authenticated By: Alcide Clever, M.D.    Dg Chest Port 1 View  08/24/2012  *RADIOLOGY REPORT*  Clinical Data: Short of breath.  CHF.  End-stage renal disease. Diabetic.  PORTABLE CHEST - 1 VIEW  Comparison: None.  Findings: Right middle lobe  consolidation is present, respecting minor fissure.  Cardiomegaly.  Interstitial pulmonary edema is present. Monitoring leads are projected over the chest.  Aortic arch atherosclerosis.  IMPRESSION:  1.  Right middle lobe consolidation, most compatible with pneumonia.  Asymmetric/atypical pulmonary edema considered less likely. 2.  Mild to moderate CHF with interstitial pulmonary edema diffusely and cardiomegaly.   Original Report Authenticated By: Andreas Newport, M.D.     Medications: Scheduled Meds:   . amLODipine  10 mg Oral QHS  . calcium acetate (Phos Binder)  1,334 mg Oral TID WC  . ceFEPime (MAXIPIME) IV  1 g Intravenous Q8H  . cinacalcet  60 mg Oral Daily  . darbepoetin (ARANESP) injection - DIALYSIS  60 mcg Intravenous Q Sat-HD  . famotidine  20 mg Oral QHS  . insulin aspart  0-5 Units Subcutaneous QHS  . insulin aspart  0-9 Units Subcutaneous TID WC  . insulin aspart  0-9 Units Subcutaneous TID WC  . insulin glargine  16 Units Subcutaneous QHS  . levalbuterol  0.63 mg Nebulization Q6H  . losartan  50 mg Oral QHS  . metoprolol  5 mg Intravenous Once  . paricalcitol  17 mcg Intravenous Q T,Th,Sa-HD  . sodium chloride  3 mL Intravenous Q12H  . vancomycin  750 mg Intravenous Q T,Th,Sa-HD   Continuous Infusions:  PRN Meds:.sodium chloride, sodium chloride  Assessment/Plan:  1. Acute hypoxic resp failure: due to fluid overload, pneumonia, off BIPAP on HD now  2. Health care assoc pneumonia: continue VAnc/Zosyn, blood cultures XOpenex nebs  3. ESRD with volume overload: HD with extra volume removal per renal  4. HTN (hypertension)-accelerated/uncontrolled at admission: improved with HD, resume amlodpine, losartan  5. DM (diabetes mellitus): resume lantus at lower dose, SSI  6. Anemia: from CKD and recent GI bleed, denies  further bleeding  7. PVD: bilateral amputee  DVT proph: lovenox FULL CODE Disposition: inpatient  Time spent coordinating care:   LOS: 0  days   [facility] Pager: [phone] 08/24/2012, 3:02 PM

## 2012-08-24 NOTE — Progress Notes (Signed)
ANTIBIOTIC CONSULT NOTE - INITIAL  Pharmacy Consult for vancomycin Indication: pneumonia  No Known Allergies  Patient Measurements: ody Weight: 62kg  Vital Signs: Temp: 98 F (36.7 C) (12/21 0045) Temp src: Oral (12/21 0045) BP: 207/97 mmHg (12/21 0045) Pulse Rate: 117  (12/21 0045)   Microbiology: No results found for this or any previous visit (from the past 720 hour(s)).  Medical History: Past Medical History  Diagnosis Date  . Hypertension   . Anemia   . Systolic CHF, acute on chronic   . Coronary artery disease   . Peripheral vascular disease   . CHF (congestive heart failure)   . Angina   . Blood transfusion ~2003; 08/12/2012    "when I started on dialysis; lower gi bleeding" (08/12/2012)  . Hypercholesteremia   . DVT of leg (deep venous thrombosis) 2011    "RLE" (08/12/2012)  . Shortness of breath 01/02/12    "@rest ; lying down; w/exertion"; no change 08/12/2012  . Type II diabetes mellitus   . Acute lower GI bleeding 08/12/2012  . ESRD on dialysis     "[facility]; M, T, Thus, Sat" (08/12/2012)    Assessment: 51yo female with ESRD on HD c/o non-productive cough with sudden onset of SOB a few hours ago, CXR c/w PNA, to begin IV ABX.  Goal of Therapy:  Pre-HD vanc level 15-25  Plan:  Will give vancomycin 1250mg  IV x1 then 750mg  after each HD for total LOT of 8 days and monitor CBC, Cx, levels prn.  Colleen Can PharmD BCPS 08/24/2012,2:14 AM

## 2012-08-24 NOTE — Procedures (Signed)
I was present at this dialysis session. I have reviewed the session itself and made appropriate changes.   Vinson Moselle, MD [employer] 08/24/2012, 2:08 PM

## 2012-08-25 DIAGNOSIS — D649 Anemia, unspecified: Secondary | ICD-10-CM

## 2012-08-25 LAB — GLUCOSE, CAPILLARY: Glucose-Capillary: 154 mg/dL — ABNORMAL HIGH (ref 70–99)

## 2012-08-25 MED ORDER — HEPARIN SODIUM (PORCINE) 1000 UNIT/ML DIALYSIS
20.0000 [IU]/kg | INTRAMUSCULAR | Status: DC | PRN
  Administered 2012-08-26: 1200 [IU] via INTRAVENOUS_CENTRAL
  Filled 2012-08-25: qty 2

## 2012-08-25 MED ORDER — SODIUM CHLORIDE 0.9 % IV SOLN: 100.0000 mL | INTRAVENOUS | Status: DC | PRN

## 2012-08-25 MED ORDER — LIDOCAINE-PRILOCAINE 2.5-2.5 % EX CREA: 1.0000 "application " | TOPICAL_CREAM | CUTANEOUS | Status: DC | PRN

## 2012-08-25 MED ORDER — HEPARIN SODIUM (PORCINE) 1000 UNIT/ML DIALYSIS
1000.0000 [IU] | INTRAMUSCULAR | Status: DC | PRN
  Filled 2012-08-25: qty 1

## 2012-08-25 MED ORDER — LEVALBUTEROL HCL 0.63 MG/3ML IN NEBU
0.6300 mg | INHALATION_SOLUTION | Freq: Four times a day (QID) | RESPIRATORY_TRACT | Status: DC
  Administered 2012-08-25 – 2012-08-27 (×6): 0.63 mg via RESPIRATORY_TRACT
  Filled 2012-08-25 (×13): qty 3

## 2012-08-25 MED ORDER — PENTAFLUOROPROP-TETRAFLUOROETH EX AERO: 1.0000 "application " | INHALATION_SPRAY | CUTANEOUS | Status: DC | PRN

## 2012-08-25 MED ORDER — ALTEPLASE 2 MG IJ SOLR
2.0000 mg | Freq: Once | INTRAMUSCULAR | Status: AC | PRN
  Filled 2012-08-25: qty 2

## 2012-08-25 MED ORDER — NEPRO/CARBSTEADY PO LIQD: 237.0000 mL | ORAL | Status: DC | PRN

## 2012-08-25 MED ORDER — LIDOCAINE HCL (PF) 1 % IJ SOLN: 5.0000 mL | INTRAMUSCULAR | Status: DC | PRN

## 2012-08-25 NOTE — Progress Notes (Addendum)
[facility]             Progress Note   Subjective: Feels much better than yesterday, fevers down, still with cough-productive  Objective: Vital signs in last 24 hours: Temp:  [98.1 F (36.7 C)-102.2 F (39 C)] 98.1 F (36.7 C) (12/22 1020) Pulse Rate:  [97-136] 102  (12/22 1020) Resp:  [18-39] 18  (12/22 1020) BP: (116-160)/(54-97) 123/54 mmHg (12/22 1020) SpO2:  [95 %-100 %] 100 % (12/22 1020) FiO2 (%):  [3 %] 3 % (12/21 1220) Weight:  [62.052 kg (136 lb 12.8 oz)] 62.052 kg (136 lb 12.8 oz) (12/21 2100) Weight change:  Last BM Date: 08/24/12  Intake/Output from previous day: 12/21 0701 - 12/22 0700 In: 130 [I.V.:30; IV Piggyback:100] Out: 3170      Physical Exam: General: Alert, awake, ill appearing, oriented x3, with chills. HEENT: No bruits, no goiter. Heart: Regular rate and rhythm, without murmurs, rubs, gallops. Lungs: scattered ronchi at bases Abdomen: Soft, nontender, nondistended, positive bowel sounds. Extremities: bilateral amputee; left BKA right AKA; no edema, well healed Neuro: Grossly intact, nonfocal.    Lab Results: Basic Metabolic Panel:  Basename 08/24/12 1455 08/24/12 0155  NA 135 130*  K 4.2 3.8  CL 94* 86*  CO2 28 23  GLUCOSE 189* 158*  BUN 14 53*  CREATININE 3.96* 10.14*  CALCIUM 9.7 9.8  MG -- --  PHOS -- --   Liver Function Tests:  Basename 08/24/12 1455  AST 19  ALT 12  ALKPHOS 68  BILITOT 0.4  PROT 7.5  ALBUMIN 3.2*   No results found for this basename: LIPASE:2,AMYLASE:2 in the last 72 hours No results found for this basename: AMMONIA:2 in the last 72 hours CBC:  Basename 08/24/12 0155  WBC 9.7  NEUTROABS 8.0*  HGB 8.7*  HCT 26.6*  MCV 83.9  PLT 295   Cardiac Enzymes:  Basename 08/24/12 2006 08/24/12 1445 08/24/12 0151  CKTOTAL -- -- --  CKMB -- -- --  CKMBINDEX -- -- --  TROPONINI <0.30 <0.30 <0.30   BNP:  Basename 08/24/12 1455  PROBNP 26314.0*   D-Dimer: No results found for this  basename: DDIMER:2 in the last 72 hours CBG:  Basename 08/25/12 0739 08/24/12 2119 08/24/12 1722 08/24/12 1125  GLUCAP 121* 126* 158* 105*   Hemoglobin A1C: No results found for this basename: HGBA1C in the last 72 hours Fasting Lipid Panel: No results found for this basename: CHOL,HDL,LDLCALC,TRIG,CHOLHDL,LDLDIRECT in the last 72 hours Thyroid Function Tests: No results found for this basename: TSH,T4TOTAL,FREET4,T3FREE,THYROIDAB in the last 72 hours Anemia Panel: No results found for this basename: VITAMINB12,FOLATE,FERRITIN,TIBC,IRON,RETICCTPCT in the last 72 hours Coagulation: No results found for this basename: LABPROT:2,INR:2 in the last 72 hours Urine Drug Screen: Drugs of Abuse     Component Value Date/Time   LABOPIA NONE DETECTED 06/23/2008 0529   COCAINSCRNUR NONE DETECTED 06/23/2008 0529   LABBENZ NONE DETECTED 06/23/2008 0529   AMPHETMU NONE DETECTED 06/23/2008 0529   THCU NONE DETECTED 06/23/2008 0529   LABBARB  Value: NONE DETECTED        DRUG SCREEN FOR MEDICAL PURPOSES ONLY.  IF CONFIRMATION IS NEEDED FOR ANY PURPOSE, NOTIFY LAB WITHIN 5 DAYS. 06/23/2008 0529    Alcohol Level: No results found for this basename: ETH:2 in the last 72 hours Urinalysis: No results found for this basename: COLORURINE:2,APPERANCEUR:2,LABSPEC:2,PHURINE:2,GLUCOSEU:2,HGBUR:2,BILIRUBINUR:2,KETONESUR:2,PROTEINUR:2,UROBILINOGEN:2,NITRITE:2,LEUKOCYTESUR:2 in the last 72 hours  Recent Results (from the past 240 hour(s))  CULTURE, EXPECTORATED SPUTUM-ASSESSMENT     Status: Normal   Collection  Time   08/24/12  5:29 PM      Component Value Range Status Comment   Specimen Description SPUTUM   Final    Special Requests NONE   Final    Sputum evaluation     Final    Value: MICROSCOPIC FINDINGS SUGGEST THAT THIS SPECIMEN IS NOT REPRESENTATIVE OF LOWER RESPIRATORY SECRETIONS. PLEASE RECOLLECT.     CALLED TO MARSHALL RN 08/24/12 1935 WOOTEN,K   Report Status 08/24/2012 FINAL   Final      Studies/Results: Dg Chest Port 1 View  08/24/2012  *RADIOLOGY REPORT*  Clinical Data: Follow up pneumonia  PORTABLE CHEST - 1 VIEW  Comparison: 09/25/2011 01:10 hours  Findings: There is improved aeration in the right middle lobe with slight improvement of the infiltrates seen.  There remains mild edema bilaterally.  Cardiac shadow is stable.  No new focal abnormality is seen.  IMPRESSION: Improved aeration in the right middle lobe.   Original Report Authenticated By: Alcide Clever, M.D.    Dg Chest Port 1 View  08/24/2012  *RADIOLOGY REPORT*  Clinical Data: Short of breath.  CHF.  End-stage renal disease. Diabetic.  PORTABLE CHEST - 1 VIEW  Comparison: None.  Findings: Right middle lobe consolidation is present, respecting minor fissure.  Cardiomegaly.  Interstitial pulmonary edema is present. Monitoring leads are projected over the chest.  Aortic arch atherosclerosis.  IMPRESSION:  1.  Right middle lobe consolidation, most compatible with pneumonia.  Asymmetric/atypical pulmonary edema considered less likely. 2.  Mild to moderate CHF with interstitial pulmonary edema diffusely and cardiomegaly.   Original Report Authenticated By: Andreas Newport, M.D.     Medications: Scheduled Meds:    . amLODipine  10 mg Oral QHS  . calcium acetate (Phos Binder)  1,334 mg Oral TID WC  . ceFEPime (MAXIPIME) IV  2 g Intravenous Q T,Th,Sa-HD  . ceFEPime (MAXIPIME) IV  2 g Intravenous Q Mon-HD  . cinacalcet  60 mg Oral Daily  . darbepoetin (ARANESP) injection - DIALYSIS  60 mcg Intravenous Q Sat-HD  . famotidine  20 mg Oral QHS  . guaiFENesin  600 mg Oral BID  . insulin aspart  0-5 Units Subcutaneous QHS  . insulin aspart  0-9 Units Subcutaneous TID WC  . insulin aspart  0-9 Units Subcutaneous TID WC  . insulin glargine  16 Units Subcutaneous QHS  . levalbuterol  0.63 mg Nebulization QID  . levofloxacin (LEVAQUIN) IV  500 mg Intravenous Q48H  . losartan  50 mg Oral QHS  . paricalcitol  17 mcg  Intravenous Q T,Th,Sa-HD  . vancomycin  750 mg Intravenous Q T,Th,Sa-HD  . vancomycin  750 mg Intravenous Q Mon-HD   Continuous Infusions:  PRN Meds:.sodium chloride, sodium chloride, acetaminophen, alteplase, feeding supplement (NEPRO CARB STEADY), heparin, heparin, lidocaine, lidocaine-prilocaine, ondansetron (ZOFRAN) IV, pentafluoroprop-tetrafluoroeth  Assessment/Plan:  1. Acute hypoxic resp failure: due to fluid overload, pneumonia, off BIPAP  2. Health care assoc pneumonia: continue VAnc/Cefepime/Levaquin,  FU Blood cultures Xopenex nebs, mucinex Fevers down  3. ESRD with volume overload: HD with extra volume removal per renal  4. HTN (hypertension)-accelerated/uncontrolled at admission: improved with HD, resumed amlodpine, losartan  5. DM (diabetes mellitus): stable, lantus and SSI  6. Anemia: from CKD and recent GI bleed, denies  further bleeding, CBC in am  7. PVD: bilateral amputee  DVT proph: lovenox FULL CODE Disposition: inpatient  Time spent coordinating care:   LOS: 1 day   [facility] Pager: [phone] 08/25/2012, 10:46 AM

## 2012-08-25 NOTE — Progress Notes (Signed)
[facility] Progress Note  Subjective:   Fever last night. Feels a little better; requesting different breakfast  Objective Filed Vitals:   08/24/12 1722 08/24/12 2010 08/24/12 2100 08/25/12 0509  BP: 158/86  128/59 137/69  Pulse: 126 111 97 114  Temp: 102.2 F (39 C)  98.1 F (36.7 C) 100 F (37.8 C)  TempSrc: Oral  Oral Oral  Resp: 18 18 18 20   Weight:   62.052 kg (136 lb 12.8 oz)   SpO2: 97% 97% 95% 100%   Physical Exam General: Sitting in bed Heart:tachy reg Lungs: No rales; decreased BS upper right mid lung Abdomen: soft Extremities: bilateral amputee no edema Dialysis Access: left AVF  Dialysis Orders: Center: [place] on MonTTS  EDW 61 HD Bath Time 4 Heparin- "tight" exact dose not available - was to have increased to 3000 12/26 . Access left [id number]/[id number] 18 mcg IV/HD Epogen 10K Units IV/HD Venofer none- ferritin last 1100 in November   Assessment/Plan:  1. SOB secondary to PNA and pulmonary edema - symptomatically improved; had emergent HD 12/21 due to volume;. On empiric maxipime and Vanc I suspect she has lost some weight and needs EDW lowered. Repeat CXR still shows some edema with RML infiltrate. Sputum -specimen inadequate; BC pending. 2. ESRD - MonTTS - clotted system on HD Saturday - and clotted system at outpt center 12/17 noted by Dr. Kathrene Bongo; received 3500 heparin today. VP not high - watch; actually getting less heparin at her HD center. She could stay on MTTS while here which wouldn't interfere with holiday schedule.  Orders written for Monday and will try to lower EDW. 3. Hypertension/volume - Elevated at outpt unit; on norvasc 10 and benicar 40 ; UF 3.2 on Saturday. Still above EDW if correct.  She normally dialyzes 4 days a week. I think she needs a lower EDW which would also help BP.  She has been reluctant to lowering EDW in the past. 4. Anemia - Hgb 8.7; last Hgb 9.2 12/12; Epo 10K since 12/12 without increase in Hgb; if Hgb does not  increase with volume removal, will increase Aranesp dose; received 60 Saturday 5. Metabolic bone disease - Continue zemplar 18; takes sensipar 60 and 10 phoslyra ac at home - will adjust meds when orders released. 6. Nutrition - high protein renal diet 7. DM continue Lantus and SSI 8. PVD -bilateral amputee stable  Sheffield Slider, PA-C [facility] Beeper [phone] 08/25/2012,9:53 AM  LOS: 1 day   Patient seen and examined and agree with assessment and plan as above. Breathing better, nauseated and coughing. CXR after HD showed improved edema (but not resolved) and improved R sided infiltrate.  Plan HD tomorrow with further UF as tolerated to challenge volume.   Vinson Moselle  MD [facility] [phone] pgr    [phone] cell 08/25/2012, 3:14 PM   Additional Objective Labs: Basic Metabolic Panel:  Lab 08/24/12 9528 08/24/12 0155  NA 135 130*  K 4.2 3.8  CL 94* 86*  CO2 28 23  GLUCOSE 189* 158*  BUN 14 53*  CREATININE 3.96* 10.14*  CALCIUM 9.7 9.8  ALB -- --  PHOS -- --   Liver Function Tests:  Lab 08/24/12 1455  AST 19  ALT 12  ALKPHOS 68  BILITOT 0.4  PROT 7.5  ALBUMIN 3.2*  CBC:  Lab 08/24/12 0155  WBC 9.7  NEUTROABS 8.0*  HGB 8.7*  HCT 26.6*  MCV 83.9  PLT 295   Blood Culture  Component Value Date/Time   SDES SPUTUM 08/24/2012 1729   SPECREQUEST NONE 08/24/2012 1729   CULT NO GROWTH 5 DAYS 06/19/2011 2345   REPTSTATUS 08/24/2012 FINAL 08/24/2012 1729    Cardiac Enzymes:  Lab 08/24/12 2006 08/24/12 1445 08/24/12 0151  CKTOTAL -- -- --  CKMB -- -- --  CKMBINDEX -- -- --  TROPONINI <0.30 <0.30 <0.30   CBG:  Lab 08/25/12 0739 08/24/12 2119 08/24/12 1722 08/24/12 1125  GLUCAP 121* 126* 158* 105*  Studies/Results: Dg Chest Port 1 View  08/24/2012  *RADIOLOGY REPORT*  Clinical Data: Follow up pneumonia  PORTABLE CHEST - 1 VIEW  Comparison: 09/25/2011 01:10 hours  Findings: There is improved aeration in the right  middle lobe with slight improvement of the infiltrates seen.  There remains mild edema bilaterally.  Cardiac shadow is stable.  No new focal abnormality is seen.  IMPRESSION: Improved aeration in the right middle lobe.   Original Report Authenticated By: Alcide Clever, M.D.    Dg Chest Port 1 View  08/24/2012  *RADIOLOGY REPORT*  Clinical Data: Short of breath.  CHF.  End-stage renal disease. Diabetic.  PORTABLE CHEST - 1 VIEW  Comparison: None.  Findings: Right middle lobe consolidation is present, respecting minor fissure.  Cardiomegaly.  Interstitial pulmonary edema is present. Monitoring leads are projected over the chest.  Aortic arch atherosclerosis.  IMPRESSION:  1.  Right middle lobe consolidation, most compatible with pneumonia.  Asymmetric/atypical pulmonary edema considered less likely. 2.  Mild to moderate CHF with interstitial pulmonary edema diffusely and cardiomegaly.   Original Report Authenticated By: Andreas Newport, M.D.    Medications:      . amLODipine  10 mg Oral QHS  . calcium acetate (Phos Binder)  1,334 mg Oral TID WC  . ceFEPime (MAXIPIME) IV  2 g Intravenous Q T,Th,Sa-HD  . ceFEPime (MAXIPIME) IV  2 g Intravenous Q Mon-HD  . cinacalcet  60 mg Oral Daily  . darbepoetin (ARANESP) injection - DIALYSIS  60 mcg Intravenous Q Sat-HD  . famotidine  20 mg Oral QHS  . guaiFENesin  600 mg Oral BID  . insulin aspart  0-5 Units Subcutaneous QHS  . insulin aspart  0-9 Units Subcutaneous TID WC  . insulin aspart  0-9 Units Subcutaneous TID WC  . insulin glargine  16 Units Subcutaneous QHS  . levalbuterol  0.63 mg Nebulization QID  . levofloxacin (LEVAQUIN) IV  500 mg Intravenous Q48H  . losartan  50 mg Oral QHS  . paricalcitol  17 mcg Intravenous Q T,Th,Sa-HD  . sodium chloride  3 mL Intravenous Q12H  . vancomycin  750 mg Intravenous Q T,Th,Sa-HD  . vancomycin  750 mg Intravenous Q Mon-HD

## 2012-08-26 DIAGNOSIS — E119 Type 2 diabetes mellitus without complications: Secondary | ICD-10-CM

## 2012-08-26 LAB — CBC
HCT: 24.2 % — ABNORMAL LOW (ref 36.0–46.0)
MCHC: 31.4 g/dL (ref 30.0–36.0)
MCV: 85.5 fL (ref 78.0–100.0)
RDW: 15 % (ref 11.5–15.5)

## 2012-08-26 LAB — RENAL FUNCTION PANEL
Albumin: 2.7 g/dL — ABNORMAL LOW (ref 3.5–5.2)
BUN: 42 mg/dL — ABNORMAL HIGH (ref 6–23)
CO2: 21 mEq/L (ref 19–32)
Calcium: 8.8 mg/dL (ref 8.4–10.5)
Chloride: 92 mEq/L — ABNORMAL LOW (ref 96–112)
Creatinine, Ser: 9.65 mg/dL — ABNORMAL HIGH (ref 0.50–1.10)
GFR calc Af Amer: 5 mL/min — ABNORMAL LOW (ref >90)
GFR calc non Af Amer: 4 mL/min — ABNORMAL LOW (ref >90)
Glucose, Bld: 77 mg/dL (ref 70–99)
Phosphorus: 6.3 mg/dL — ABNORMAL HIGH (ref 2.3–4.6)
Potassium: 4.2 mEq/L (ref 3.5–5.1)
Sodium: 134 mEq/L — ABNORMAL LOW (ref 135–145)

## 2012-08-26 LAB — GLUCOSE, CAPILLARY: Glucose-Capillary: 126 mg/dL — ABNORMAL HIGH (ref 70–99)

## 2012-08-26 LAB — PREPARE RBC (CROSSMATCH)

## 2012-08-26 MED ORDER — PARICALCITOL 5 MCG/ML IV SOLN
INTRAVENOUS | Status: AC
  Administered 2012-08-26: 17 ug via INTRAVENOUS
  Filled 2012-08-26: qty 4

## 2012-08-26 MED ORDER — ACETAMINOPHEN 325 MG PO TABS
ORAL_TABLET | ORAL | Status: AC
  Administered 2012-08-26: 650 mg via ORAL
  Filled 2012-08-26: qty 2

## 2012-08-26 MED ORDER — GUAIFENESIN-DM 100-10 MG/5ML PO SYRP
5.0000 mL | ORAL_SOLUTION | ORAL | Status: DC | PRN
  Filled 2012-08-26 (×2): qty 5

## 2012-08-26 MED ORDER — OXYCODONE-ACETAMINOPHEN 5-325 MG PO TABS
1.0000 | ORAL_TABLET | ORAL | Status: DC | PRN
  Administered 2012-08-26 – 2012-08-29 (×2): 1 via ORAL

## 2012-08-26 MED ORDER — OXYCODONE-ACETAMINOPHEN 5-325 MG PO TABS
ORAL_TABLET | ORAL | Status: AC
  Filled 2012-08-26: qty 1

## 2012-08-26 MED ORDER — INSULIN GLARGINE 100 UNIT/ML ~~LOC~~ SOLN
10.0000 [IU] | Freq: Every day | SUBCUTANEOUS | Status: DC
  Administered 2012-08-26 – 2012-08-29 (×4): 10 [IU] via SUBCUTANEOUS

## 2012-08-26 NOTE — Progress Notes (Signed)
Pt received pain med at 0226,at 0400 pt wanted something for pain explained to pt not time for tylenol it" due at 0826,and suggested to pt I will call the doctor for something else stronger for pain.pt stated"no don"t call the doctor I will wait until I can have some more tylenol".will cont to monitor.Artemio Aly RN

## 2012-08-26 NOTE — Progress Notes (Signed)
Subjective:  Seen on dialysis, complains of bilateral "phantom pains", breathing better.  Objective: Vital signs in last 24 hours: Temp:  [98 F (36.7 C)-99.7 F (37.6 C)] 98.6 F (37 C) (12/23 0700) Pulse Rate:  [83-106] 83  (12/23 0730) Resp:  [18-24] 24  (12/23 0700) BP: (123-163)/(54-90) 143/78 mmHg (12/23 0730) SpO2:  [92 %-100 %] 100 % (12/23 0730) Weight:  [62.324 kg (137 lb 6.4 oz)-62.4 kg (137 lb 9.1 oz)] 62.4 kg (137 lb 9.1 oz) (12/23 0700) Weight change: 0.272 kg (9.6 oz)  Intake/Output from previous day: 12/22 0701 - 12/23 0700 In: 60 [P.O.:60] Out: -    EXAM: General appearance:  Alert, in no apparent distress Resp:  CTA without rales, rhonchi, or wheezes Cardio:  RRR without murmur or rub GI:  + BS, soft and nontender Extremities:  Right AKA, left BKA, no edema Access:  AVF @ LUA with BFR 400 cc/min  Lab Results:  Basename 08/26/12 0537 08/24/12 0155  WBC 6.0 9.7  HGB 7.6* 8.7*  HCT 24.2* 26.6*  PLT 213 295   BMET:  Basename 08/26/12 0723 08/24/12 1455  NA 134* 135  K 4.2 4.2  CL 92* 94*  CO2 21 28  GLUCOSE 77 189*  BUN 42* 14  CREATININE 9.65* 3.96*  CALCIUM 8.8 9.7  ALBUMIN 2.7* 3.2*   No results found for this basename: PTH:2 in the last 72 hours Iron Studies: No results found for this basename: IRON,TIBC,TRANSFERRIN,FERRITIN in the last 72 hours  Dialysis Orders: Center: [place] on TTS  EDW 61 HD Bath Time 4 Heparin- "tight" exact dose not available - was to have increased to 3000 12/26 . Access left AVF400/600Zemplar 18 mcg IV/HD Epogen 10K Units IV/HD Venofer none- ferritin last 1100 in November  Assessment/Plan: 1. SOB - secondary to PNA and pulmonary edema; improved, now on O2 per [place], s/p emergent HD 12/21 due to volume, on empiric IV Maxipime and Vanc, also Levaquin; repeat CXR showed mild edema and improved aeration in RML. 2. ESRD - HD on TTS @ [place]; pre-HD K 4.2 today.  3. Hypertension/volume - BP 134/77, on Norvasc 10 mg qd and Losartan  50 mg qd; UF goal of 3 L today (pre-HD wt 62.4 kg); decrease volume will help BP.  4. Anemia - Hgb decreasing (7.6 form 8.7); on outpatient Epo 10K, s/p transfusion on 12/10 during hospitalization for GI bleeding (EGD and colonoscopy without source), no noticeable bleeding since discharge on 12/11.  Transfuse 2 U PRBCs and follow. 5. Metabolic bone disease - Ca 8.8 (9.8 corrected), P 6.3; on Zemplar 18 mcg; Sensipar 60 mg qd and 10 phoslyra ac at home.  6. Nutrition - Alb 2.7, renal diet  7. DM - continue Lantus and SSI.  8. PVD - bilateral amputee.    LOS: 2 days   LYLES,CHARLES 08/26/2012,8:16 AM I have seen and examined this patient and agree with plan of Gerome Apley. Pt seen on HD Ap 100 Vp 270.  Trying to decrease DW.  Hg down to 7.6. Denies melena or hematochezia.  Had recent GI eval.  To get two units on HD. Randie Tallarico T,MD 08/26/2012 8:56 AM

## 2012-08-26 NOTE — Progress Notes (Signed)
Patient does not wear bipap

## 2012-08-26 NOTE — Progress Notes (Signed)
[facility]             Progress Note   Subjective: Feels better, fevers down, still with cough-productive associated with pain in chest  Objective: Vital signs in last 24 hours: Temp:  [97.6 F (36.4 C)-99.7 F (37.6 C)] 98.2 F (36.8 C) (12/23 1242) Pulse Rate:  [76-106] 86  (12/23 1242) Resp:  [18-24] 20  (12/23 1242) BP: (129-165)/(50-90) 138/61 mmHg (12/23 1242) SpO2:  [92 %-100 %] 100 % (12/23 1242) Weight:  [59 kg (130 lb 1.1 oz)-62.4 kg (137 lb 9.1 oz)] 59 kg (130 lb 1.1 oz) (12/23 1205) Weight change: 0.272 kg (9.6 oz) Last BM Date: 08/25/12  Intake/Output from previous day: 12/22 0701 - 12/23 0700 In: 60 [P.O.:60] Out: -  Total I/O In: 1000 [I.V.:300; Blood:700] Out: 2896 [Other:2896]   Physical Exam: General: Alert, awake, ill appearing, oriented x3, with chills. HEENT: No bruits, no goiter. Heart: Regular rate and rhythm, without murmurs, rubs, gallops. Lungs: scattered ronchi at bases Abdomen: Soft, nontender, nondistended, positive bowel sounds. Extremities: bilateral amputee; left BKA right AKA; no edema, well healed Neuro: Grossly intact, nonfocal.    Lab Results: Basic Metabolic Panel:  Basename 08/26/12 0723 08/24/12 1455  NA 134* 135  K 4.2 4.2  CL 92* 94*  CO2 21 28  GLUCOSE 77 189*  BUN 42* 14  CREATININE 9.65* 3.96*  CALCIUM 8.8 9.7  MG -- --  PHOS 6.3* --   Liver Function Tests:  Basename 08/26/12 0723 08/24/12 1455  AST -- 19  ALT -- 12  ALKPHOS -- 68  BILITOT -- 0.4  PROT -- 7.5  ALBUMIN 2.7* 3.2*   No results found for this basename: LIPASE:2,AMYLASE:2 in the last 72 hours No results found for this basename: AMMONIA:2 in the last 72 hours CBC:  Basename 08/26/12 0537 08/24/12 0155  WBC 6.0 9.7  NEUTROABS -- 8.0*  HGB 7.6* 8.7*  HCT 24.2* 26.6*  MCV 85.5 83.9  PLT 213 295   Cardiac Enzymes:  Basename 08/24/12 2006 08/24/12 1445 08/24/12 0151  CKTOTAL -- -- --  CKMB -- -- --  CKMBINDEX -- -- --   TROPONINI <0.30 <0.30 <0.30   BNP:  Basename 08/24/12 1455  PROBNP 26314.0*   D-Dimer: No results found for this basename: DDIMER:2 in the last 72 hours CBG:  Basename 08/26/12 1225 08/25/12 2204 08/25/12 1655 08/25/12 1125 08/25/12 0739 08/24/12 2119  GLUCAP 67* 94 108* 154* 121* 126*   Hemoglobin A1C: No results found for this basename: HGBA1C in the last 72 hours Fasting Lipid Panel: No results found for this basename: CHOL,HDL,LDLCALC,TRIG,CHOLHDL,LDLDIRECT in the last 72 hours Thyroid Function Tests: No results found for this basename: TSH,T4TOTAL,FREET4,T3FREE,THYROIDAB in the last 72 hours Anemia Panel: No results found for this basename: VITAMINB12,FOLATE,FERRITIN,TIBC,IRON,RETICCTPCT in the last 72 hours Coagulation: No results found for this basename: LABPROT:2,INR:2 in the last 72 hours Urine Drug Screen: Drugs of Abuse     Component Value Date/Time   LABOPIA NONE DETECTED 06/23/2008 0529   COCAINSCRNUR NONE DETECTED 06/23/2008 0529   LABBENZ NONE DETECTED 06/23/2008 0529   AMPHETMU NONE DETECTED 06/23/2008 0529   THCU NONE DETECTED 06/23/2008 0529   LABBARB  Value: NONE DETECTED        DRUG SCREEN FOR MEDICAL PURPOSES ONLY.  IF CONFIRMATION IS NEEDED FOR ANY PURPOSE, NOTIFY LAB WITHIN 5 DAYS. 06/23/2008 0529    Alcohol Level: No results found for this basename: ETH:2 in the last 72 hours Urinalysis: No results found for this  basename: COLORURINE:2,APPERANCEUR:2,LABSPEC:2,PHURINE:2,GLUCOSEU:2,HGBUR:2,BILIRUBINUR:2,KETONESUR:2,PROTEINUR:2,UROBILINOGEN:2,NITRITE:2,LEUKOCYTESUR:2 in the last 72 hours  Recent Results (from the past 240 hour(s))  CULTURE, BLOOD (SINGLE)     Status: Normal (Preliminary result)   Collection Time   08/24/12  2:00 AM      Component Value Range Status Comment   Specimen Description BLOOD RIGHT HAND   Final    Special Requests BOTTLES DRAWN AEROBIC AND ANAEROBIC 5CC EACH   Final    Culture  Setup Time 08/24/2012 08:12   Final     Culture     Final    Value:        BLOOD CULTURE RECEIVED NO GROWTH TO DATE CULTURE WILL BE HELD FOR 5 DAYS BEFORE ISSUING A FINAL NEGATIVE REPORT   Report Status PENDING   Incomplete   CULTURE, BLOOD (ROUTINE X 2)     Status: Normal (Preliminary result)   Collection Time   08/24/12  2:45 PM      Component Value Range Status Comment   Specimen Description BLOOD RIGHT HAND   Final    Special Requests BOTTLES DRAWN AEROBIC ONLY 10CC   Final    Culture  Setup Time 08/24/2012 23:24   Final    Culture     Final    Value:        BLOOD CULTURE RECEIVED NO GROWTH TO DATE CULTURE WILL BE HELD FOR 5 DAYS BEFORE ISSUING A FINAL NEGATIVE REPORT   Report Status PENDING   Incomplete   CULTURE, EXPECTORATED SPUTUM-ASSESSMENT     Status: Normal   Collection Time   08/24/12  5:29 PM      Component Value Range Status Comment   Specimen Description SPUTUM   Final    Special Requests NONE   Final    Sputum evaluation     Final    Value: MICROSCOPIC FINDINGS SUGGEST THAT THIS SPECIMEN IS NOT REPRESENTATIVE OF LOWER RESPIRATORY SECRETIONS. PLEASE RECOLLECT.     CALLED TO MARSHALL RN 08/24/12 1935 WOOTEN,K   Report Status 08/24/2012 FINAL   Final     Studies/Results: Dg Chest Port 1 View  08/24/2012  *RADIOLOGY REPORT*  Clinical Data: Follow up pneumonia  PORTABLE CHEST - 1 VIEW  Comparison: 09/25/2011 01:10 hours  Findings: There is improved aeration in the right middle lobe with slight improvement of the infiltrates seen.  There remains mild edema bilaterally.  Cardiac shadow is stable.  No new focal abnormality is seen.  IMPRESSION: Improved aeration in the right middle lobe.   Original Report Authenticated By: Alcide Clever, M.D.     Medications: Scheduled Meds:    . amLODipine  10 mg Oral QHS  . calcium acetate (Phos Binder)  1,334 mg Oral TID WC  . ceFEPime (MAXIPIME) IV  2 g Intravenous Q T,Th,Sa-HD  . ceFEPime (MAXIPIME) IV  2 g Intravenous Q Mon-HD  . cinacalcet  60 mg Oral Daily  .  darbepoetin (ARANESP) injection - DIALYSIS  60 mcg Intravenous Q Sat-HD  . famotidine  20 mg Oral QHS  . guaiFENesin  600 mg Oral BID  . insulin aspart  0-5 Units Subcutaneous QHS  . insulin aspart  0-9 Units Subcutaneous TID WC  . insulin glargine  16 Units Subcutaneous QHS  . levalbuterol  0.63 mg Nebulization QID  . levofloxacin (LEVAQUIN) IV  500 mg Intravenous Q48H  . losartan  50 mg Oral QHS  . oxyCODONE-acetaminophen      . paricalcitol  17 mcg Intravenous Q T,Th,Sa-HD  . vancomycin  750 mg Intravenous  Q T,Th,Sa-HD  . vancomycin  750 mg Intravenous Q Mon-HD   Continuous Infusions:  PRN Meds:.acetaminophen, guaiFENesin-dextromethorphan, ondansetron (ZOFRAN) IV, oxyCODONE-acetaminophen  Assessment/Plan:  1. Acute hypoxic resp failure: due to fluid overload, pneumonia, off BIPAP  2. Health care assoc pneumonia: continue VAnc/Cefepime/Levaquin, transition to PO abx tomorrow Blood cultures negative so far Xopenex nebs, mucinex Fevers down  3. ESRD with volume overload: HD  per renal  4. HTN (hypertension)-accelerated/uncontrolled at admission: improved with HD, resumed amlodpine, losartan  5. DM (diabetes mellitus): stable, lantus and SSI  6. Anemia: from CKD and recent GI bleed, Hb trended down, for 2 units PRBC today with HD, denies  further bleeding, CBC in am  7. PVD: bilateral amputee  DVT proph: lovenox FULL CODE Disposition: inpatient  Time spent coordinating care:   LOS: 2 days   [facility] Pager: [phone] 08/26/2012, 2:42 PM

## 2012-08-26 NOTE — Procedures (Signed)
4.5 hour hemodialysis treatment completed via left upper arm AVF.  Tolerated removal of 2.8 liters (net) with no interruption in ultrafiltration.  Hgb 7.6 (down from 8.7 two days ago).  Pt received 2U PRBC with HD.  All blood was reinfused.  Hemostasis achieved within 12 minutes.

## 2012-08-27 ENCOUNTER — Inpatient Hospital Stay (HOSPITAL_COMMUNITY): Payer: Medicare Other

## 2012-08-27 DIAGNOSIS — R1110 Vomiting, unspecified: Secondary | ICD-10-CM

## 2012-08-27 LAB — TYPE AND SCREEN
ABO/RH(D): A POS
Unit division: 0

## 2012-08-27 LAB — CBC
MCH: 28 pg (ref 26.0–34.0)
Platelets: 217 10*3/uL (ref 150–400)
RBC: 4.21 MIL/uL (ref 3.87–5.11)
RDW: 14.5 % (ref 11.5–15.5)

## 2012-08-27 MED ORDER — PROMETHAZINE HCL 25 MG/ML IJ SOLN
12.5000 mg | Freq: Four times a day (QID) | INTRAMUSCULAR | Status: DC | PRN
  Administered 2012-08-27: 12.5 mg via INTRAVENOUS
  Filled 2012-08-27: qty 1

## 2012-08-27 MED ORDER — LEVALBUTEROL HCL 0.63 MG/3ML IN NEBU
0.6300 mg | INHALATION_SOLUTION | Freq: Three times a day (TID) | RESPIRATORY_TRACT | Status: DC
  Administered 2012-08-27 – 2012-08-30 (×7): 0.63 mg via RESPIRATORY_TRACT
  Filled 2012-08-27 (×11): qty 3

## 2012-08-27 MED ORDER — IOHEXOL 300 MG/ML  SOLN
80.0000 mL | Freq: Once | INTRAMUSCULAR | Status: AC | PRN
  Administered 2012-08-27: 80 mL via INTRAVENOUS

## 2012-08-27 MED ORDER — IOHEXOL 300 MG/ML  SOLN: 20.0000 mL | INTRAMUSCULAR | Status: AC

## 2012-08-27 NOTE — Progress Notes (Addendum)
[facility]             Progress Note   Subjective: Abdominal pain today, still coughing, vomiting last pm and this morning  Objective: Vital signs in last 24 hours: Temp:  [97.9 F (36.6 C)-99.8 F (37.7 C)] 98.3 F (36.8 C) (12/24 1256) Pulse Rate:  [88-106] 88  (12/24 1256) Resp:  [18-20] 18  (12/24 1256) BP: (126-162)/(61-81) 152/68 mmHg (12/24 1256) SpO2:  [91 %-100 %] 100 % (12/24 1256) Weight:  [59.467 kg (131 lb 1.6 oz)] 59.467 kg (131 lb 1.6 oz) (12/23 2121) Weight change: -3.324 kg (-7 lb 5.3 oz) Last BM Date: 08/25/12  Intake/Output from previous day: 12/23 0701 - 12/24 0700 In: 1600 [P.O.:600; I.V.:300; Blood:700] Out: 2969 [Urine:1; Emesis/NG output:72]     Physical Exam: General: Alert, awake, ill appearing, oriented x3, with chills. HEENT: No bruits, no goiter. Heart: Regular rate and rhythm, without murmurs, rubs, gallops. Lungs: scattered ronchi at bases Abdomen: Soft, tender in lower quadrant, nondistended, positive bowel sounds. Extremities: bilateral amputee; left BKA right AKA; no edema, well healed Neuro: Grossly intact, nonfocal.    Lab Results: Basic Metabolic Panel:  Basename 08/26/12 0723 08/24/12 1455  NA 134* 135  K 4.2 4.2  CL 92* 94*  CO2 21 28  GLUCOSE 77 189*  BUN 42* 14  CREATININE 9.65* 3.96*  CALCIUM 8.8 9.7  MG -- --  PHOS 6.3* --   Liver Function Tests:  Basename 08/26/12 0723 08/24/12 1455  AST -- 19  ALT -- 12  ALKPHOS -- 68  BILITOT -- 0.4  PROT -- 7.5  ALBUMIN 2.7* 3.2*   No results found for this basename: LIPASE:2,AMYLASE:2 in the last 72 hours No results found for this basename: AMMONIA:2 in the last 72 hours CBC:  Basename 08/27/12 0450 08/26/12 0537  WBC 6.6 6.0  NEUTROABS -- --  HGB 11.8* 7.6*  HCT 35.8* 24.2*  MCV 85.0 85.5  PLT 217 213   Cardiac Enzymes:  Basename 08/24/12 2006 08/24/12 1445  CKTOTAL -- --  CKMB -- --  CKMBINDEX -- --  TROPONINI <0.30 <0.30    BNP:  Basename 08/24/12 1455  PROBNP 26314.0*   D-Dimer: No results found for this basename: DDIMER:2 in the last 72 hours CBG:  Basename 08/27/12 1128 08/27/12 0721 08/26/12 2113 08/26/12 1707 08/26/12 1225 08/25/12 2204  GLUCAP 127* 93 117* 126* 67* 94   Hemoglobin A1C: No results found for this basename: HGBA1C in the last 72 hours Fasting Lipid Panel: No results found for this basename: CHOL,HDL,LDLCALC,TRIG,CHOLHDL,LDLDIRECT in the last 72 hours Thyroid Function Tests: No results found for this basename: TSH,T4TOTAL,FREET4,T3FREE,THYROIDAB in the last 72 hours Anemia Panel: No results found for this basename: VITAMINB12,FOLATE,FERRITIN,TIBC,IRON,RETICCTPCT in the last 72 hours Coagulation: No results found for this basename: LABPROT:2,INR:2 in the last 72 hours Urine Drug Screen: Drugs of Abuse     Component Value Date/Time   LABOPIA NONE DETECTED 06/23/2008 0529   COCAINSCRNUR NONE DETECTED 06/23/2008 0529   LABBENZ NONE DETECTED 06/23/2008 0529   AMPHETMU NONE DETECTED 06/23/2008 0529   THCU NONE DETECTED 06/23/2008 0529   LABBARB  Value: NONE DETECTED        DRUG SCREEN FOR MEDICAL PURPOSES ONLY.  IF CONFIRMATION IS NEEDED FOR ANY PURPOSE, NOTIFY LAB WITHIN 5 DAYS. 06/23/2008 0529    Alcohol Level: No results found for this basename: ETH:2 in the last 72 hours Urinalysis: No results found for this basename: COLORURINE:2,APPERANCEUR:2,LABSPEC:2,PHURINE:2,GLUCOSEU:2,HGBUR:2,BILIRUBINUR:2,KETONESUR:2,PROTEINUR:2,UROBILINOGEN:2,NITRITE:2,LEUKOCYTESUR:2 in the last 72 hours  Recent Results (  from the past 240 hour(s))  CULTURE, BLOOD (SINGLE)     Status: Normal (Preliminary result)   Collection Time   08/24/12  2:00 AM      Component Value Range Status Comment   Specimen Description BLOOD RIGHT HAND   Final    Special Requests BOTTLES DRAWN AEROBIC AND ANAEROBIC 5CC EACH   Final    Culture  Setup Time 08/24/2012 08:12   Final    Culture     Final    Value:         BLOOD CULTURE RECEIVED NO GROWTH TO DATE CULTURE WILL BE HELD FOR 5 DAYS BEFORE ISSUING A FINAL NEGATIVE REPORT   Report Status PENDING   Incomplete   CULTURE, BLOOD (ROUTINE X 2)     Status: Normal (Preliminary result)   Collection Time   08/24/12  2:45 PM      Component Value Range Status Comment   Specimen Description BLOOD RIGHT HAND   Final    Special Requests BOTTLES DRAWN AEROBIC ONLY 10CC   Final    Culture  Setup Time 08/24/2012 23:24   Final    Culture     Final    Value:        BLOOD CULTURE RECEIVED NO GROWTH TO DATE CULTURE WILL BE HELD FOR 5 DAYS BEFORE ISSUING A FINAL NEGATIVE REPORT   Report Status PENDING   Incomplete   CULTURE, EXPECTORATED SPUTUM-ASSESSMENT     Status: Normal   Collection Time   08/24/12  5:29 PM      Component Value Range Status Comment   Specimen Description SPUTUM   Final    Special Requests NONE   Final    Sputum evaluation     Final    Value: MICROSCOPIC FINDINGS SUGGEST THAT THIS SPECIMEN IS NOT REPRESENTATIVE OF LOWER RESPIRATORY SECRETIONS. PLEASE RECOLLECT.     CALLED TO MARSHALL RN 08/24/12 1935 WOOTEN,K   Report Status 08/24/2012 FINAL   Final     Studies/Results: No results found.  Medications: Scheduled Meds:    . amLODipine  10 mg Oral QHS  . calcium acetate (Phos Binder)  1,334 mg Oral TID WC  . ceFEPime (MAXIPIME) IV  2 g Intravenous Q T,Th,Sa-HD  . ceFEPime (MAXIPIME) IV  2 g Intravenous Q Mon-HD  . cinacalcet  60 mg Oral Daily  . darbepoetin (ARANESP) injection - DIALYSIS  60 mcg Intravenous Q Sat-HD  . famotidine  20 mg Oral QHS  . guaiFENesin  600 mg Oral BID  . insulin aspart  0-5 Units Subcutaneous QHS  . insulin aspart  0-9 Units Subcutaneous TID WC  . insulin glargine  10 Units Subcutaneous QHS  . levalbuterol  0.63 mg Nebulization TID  . levofloxacin (LEVAQUIN) IV  500 mg Intravenous Q48H  . losartan  50 mg Oral QHS  . paricalcitol  17 mcg Intravenous Q T,Th,Sa-HD   Continuous Infusions:  PRN  Meds:.acetaminophen, guaiFENesin-dextromethorphan, ondansetron (ZOFRAN) IV, oxyCODONE-acetaminophen, promethazine  Assessment/Plan:  1. Acute hypoxic resp failure: due to fluid overload, pneumonia, off BIPAP  2. Health care assoc pneumonia: continue VAnc/Cefepime/Levaquin, transition to PO abx tomorrow Blood cultures negative so far Xopenex nebs, mucinex Fevers down  3. ESRD with volume overload: HD  per renal  4. Abd tenderness today with vomiting : will check CT abd, change diet to clears  5.HTN (hypertension)-accelerated/uncontrolled at admission: improved with HD, resumed amlodpine, losartan  6. DM (diabetes mellitus): stable, lantus and SSI  7. Anemia: from CKD and recent GI  bleed, Hb trended down, transfused 2 Units PRBC 12/23 with HD, denies  further bleeding, CBC in am  8. PVD: bilateral amputee  DVT proph: lovenox FULL CODE Disposition: inpatient  Time spent coordinating care:   LOS: 3 days   [facility] Pager: [phone] 08/27/2012, 2:05 PM

## 2012-08-27 NOTE — Progress Notes (Signed)
ANTIBIOTIC CONSULT NOTE - FOLLOW UP  Pharmacy Consult for Levaquin, Cefepime Indication: Suspected HCAP  No Known Allergies  Patient Measurements: Height:  (bilateral amputee) Weight: 131 lb 1.6 oz (59.467 kg) Adjusted Body Weight:   Vital Signs: Temp: 98.3 F (36.8 C) (12/24 1256) Temp src: Oral (12/24 1256) BP: 152/68 mmHg (12/24 1256) Pulse Rate: 88  (12/24 1256) Intake/Output from previous day: 12/23 0701 - 12/24 0700 In: 1600 [P.O.:600; I.V.:300; Blood:700] Out: 2969 [Urine:1; Emesis/NG output:72] Intake/Output from this shift:    Labs:  Basename 08/27/12 0450 08/26/12 0723 08/26/12 0537 08/24/12 1455  WBC 6.6 -- 6.0 --  HGB 11.8* -- 7.6* --  PLT 217 -- 213 --  LABCREA -- -- -- --  CREATININE -- 9.65* -- 3.96*   The CrCl is unknown because both a height and weight (above a minimum accepted value) are required for this calculation. No results found for this basename: VANCOTROUGH:2,VANCOPEAK:2,VANCORANDOM:2,GENTTROUGH:2,GENTPEAK:2,GENTRANDOM:2,TOBRATROUGH:2,TOBRAPEAK:2,TOBRARND:2,AMIKACINPEAK:2,AMIKACINTROU:2,AMIKACIN:2, in the last 72 hours   Microbiology: Recent Results (from the past 720 hour(s))  CULTURE, BLOOD (SINGLE)     Status: Normal (Preliminary result)   Collection Time   08/24/12  2:00 AM      Component Value Range Status Comment   Specimen Description BLOOD RIGHT HAND   Final    Special Requests BOTTLES DRAWN AEROBIC AND ANAEROBIC 5CC EACH   Final    Culture  Setup Time 08/24/2012 08:12   Final    Culture     Final    Value:        BLOOD CULTURE RECEIVED NO GROWTH TO DATE CULTURE WILL BE HELD FOR 5 DAYS BEFORE ISSUING A FINAL NEGATIVE REPORT   Report Status PENDING   Incomplete   CULTURE, BLOOD (ROUTINE X 2)     Status: Normal (Preliminary result)   Collection Time   08/24/12  2:45 PM      Component Value Range Status Comment   Specimen Description BLOOD RIGHT HAND   Final    Special Requests BOTTLES DRAWN AEROBIC ONLY 10CC   Final    Culture   Setup Time 08/24/2012 23:24   Final    Culture     Final    Value:        BLOOD CULTURE RECEIVED NO GROWTH TO DATE CULTURE WILL BE HELD FOR 5 DAYS BEFORE ISSUING A FINAL NEGATIVE REPORT   Report Status PENDING   Incomplete   CULTURE, EXPECTORATED SPUTUM-ASSESSMENT     Status: Normal   Collection Time   08/24/12  5:29 PM      Component Value Range Status Comment   Specimen Description SPUTUM   Final    Special Requests NONE   Final    Sputum evaluation     Final    Value: MICROSCOPIC FINDINGS SUGGEST THAT THIS SPECIMEN IS NOT REPRESENTATIVE OF LOWER RESPIRATORY SECRETIONS. PLEASE RECOLLECT.     CALLED TO MARSHALL RN 08/24/12 1935 WOOTEN,K   Report Status 08/24/2012 FINAL   Final     Anti-infectives     Start     Dose/Rate Route Frequency Ordered Stop   08/27/12 1200   vancomycin (VANCOCIN) 750 mg in sodium chloride 0.9 % 150 mL IVPB  Status:  Discontinued        750 mg 150 mL/hr over 60 Minutes Intravenous Every T-Th-Sa (Hemodialysis) 08/24/12 1401 08/27/12 1015   08/26/12 1200   ceFEPIme (MAXIPIME) 2 g in dextrose 5 % 50 mL IVPB        2 g 100 mL/hr over 30  Minutes Intravenous Every Mon (Hemodialysis) 08/24/12 1529     08/26/12 1200   vancomycin (VANCOCIN) 750 mg in sodium chloride 0.9 % 150 mL IVPB  Status:  Discontinued        750 mg 150 mL/hr over 60 Minutes Intravenous Every Mon (Hemodialysis) 08/24/12 1529 08/27/12 1015   08/26/12 0600   levofloxacin (LEVAQUIN) IVPB 500 mg        500 mg 100 mL/hr over 60 Minutes Intravenous Every 48 hours 08/24/12 1529     08/24/12 1700   ceFEPIme (MAXIPIME) 2 g in dextrose 5 % 50 mL IVPB        2 g 100 mL/hr over 30 Minutes Intravenous Every T-Th-Sa (Hemodialysis) 08/24/12 1529     08/24/12 0600   ceFEPIme (MAXIPIME) 1 g in dextrose 5 % 50 mL IVPB  Status:  Discontinued        1 g 100 mL/hr over 30 Minutes Intravenous 3 times per day 08/24/12 0152 08/24/12 2017   08/24/12 0215   vancomycin (VANCOCIN) 1,250 mg in sodium chloride 0.9 %  250 mL IVPB        1,250 mg 166.7 mL/hr over 90 Minutes Intravenous  Once 08/24/12 0213 08/24/12 0717   08/24/12 0200   levofloxacin (LEVAQUIN) IVPB 750 mg  Status:  Discontinued        750 mg 100 mL/hr over 90 Minutes Intravenous Every 24 hours 08/24/12 0152 08/24/12 0456          Assessment: 51yof on Vancomycin, Levaquin and Cefepime Day 4 for suspected HCAP. Patient has remained afebrile, WBC wnl and cultures have reported NGTD. Vancomycin was discontinued by MD this AM. Patient has ESRD and receives HD on [facility] (4x per week). Levaquin and Cefepime are dosed appropriately - follow-up for simplifying and transition to oral antibiotic regimen.  Plan:  1. Continue Levaquin 500mg  IV q48h 2. Continue Cefepime 2g IV qHD (orders entered for Monday with HD and TTS with HD) 3. Consider simplifying and transitioning to oral regimen if clinically appropriate  Cleon Dew [phone] 08/27/2012,1:25 PM

## 2012-08-27 NOTE — Progress Notes (Signed)
Subjective:   No current complaints, but nausea and vomiting in early evening and again during night.  Objective: Vital signs in last 24 hours: Temp:  [97.6 F (36.4 C)-99.8 F (37.7 C)] 99.8 F (37.7 C) (12/24 0427) Pulse Rate:  [76-106] 105  (12/24 0427) Resp:  [18-24] 20  (12/24 0427) BP: (126-165)/(50-90) 162/81 mmHg (12/24 0427) SpO2:  [91 %-100 %] 91 % (12/24 0427) Weight:  [59 kg (130 lb 1.1 oz)-62.4 kg (137 lb 9.1 oz)] 59.467 kg (131 lb 1.6 oz) (12/23 2121) Weight change: -3.324 kg (-7 lb 5.3 oz)  Intake/Output from previous day: 12/23 0701 - 12/24 0700 In: 1600 [P.O.:600; I.V.:300; Blood:700] Out: 2969 [Urine:1; Emesis/NG output:72] Intake/Output this shift: Total I/O In: 120 [P.O.:120] Out: 72 [Emesis/NG output:72] EXAM: General appearance:  Alert, in no apparent distress Resp:  CTA without rales, rhonchi, or wheezes Cardio:  RRR without murmur or rub GI:  + BS, soft and nontender Extremities:  Right AKA, left BKA, no edema Access:  AVF @ LUA with + bruit  Lab Results:  [facility] 08/26/12 0537  WBC 6.0  HGB 7.6*  HCT 24.2*  PLT 213   BMET:  Basename 08/26/12 0723 08/24/12 1455  NA 134* 135  K 4.2 4.2  CL 92* 94*  CO2 21 28  GLUCOSE 77 189*  BUN 42* 14  CREATININE 9.65* 3.96*  CALCIUM 8.8 9.7  ALBUMIN 2.7* 3.2*   No results found for this basename: PTH:2 in the last 72 hours Iron Studies: No results found for this basename: IRON,TIBC,TRANSFERRIN,FERRITIN in the last 72 hours  Dialysis Orders: Center: [place] on TTS  EDW 61 HD Bath Time 4 Heparin- "tight" exact dose not available - was to have increased to 3000 12/26 . Access left AVF400/600Zemplar 18 mcg IV/HD Epogen 10K Units IV/HD Venofer none- ferritin last 1100 in November  Assessment/Plan: 1. SOB - secondary to PNA and pulmonary edema; improved s/p emergent HD 12/21 due to volume, on empiric IV Maxipime, Vanc, and Levaquin. 2. ESRD - HD on TTS @ [place]; pre-HD K 4.2 yesterday.   3. Hypertension/volume - BP 162/81, on Norvasc 10 mg qd and Losartan 50 mg qd; wt 59.5 kg s/p net UF 2.9 L; decrease volume will help BP.  4. Anemia - Hgb down to 7.6 yesterday (from 8.7); received 2 U PRBCs with HD yesterday; s/p transfusion on 12/10 during hospitalization for GI bleeding (EGD and colonoscopy without source), no noticeable bleeding since discharge on 12/11.   5. Metabolic bone disease - Ca 8.8 (9.8 corrected), P 6.3; on Zemplar 18 mcg; Sensipar 60 mg qd and 10 phoslyra ac at home.  6. Nutrition - Alb 2.7, renal diet  7. DM - continue Lantus and SSI.  8. PVD - bilateral amputee.    LOS: 3 days   LYLES,CHARLES 08/27/2012,6:45 AM  I have seen and examined this patient and agree with plan per Gerome Apley.  Vomited last night but feels better this AM.  Normally HD 4x/wk due to excess fluid but PO intake controlled in hosp.  Hg up to 11.8 post transfusion which makes one wonder about the hg of 7.6?  Plan HD again on thurs Adriann Thau T,MD 08/27/2012 8:51 AM

## 2012-08-28 DIAGNOSIS — R1110 Vomiting, unspecified: Secondary | ICD-10-CM | POA: Diagnosis not present

## 2012-08-28 LAB — CBC
Hemoglobin: 10.9 g/dL — ABNORMAL LOW (ref 12.0–15.0)
MCH: 27.8 pg (ref 26.0–34.0)
MCV: 84.2 fL (ref 78.0–100.0)
RBC: 3.92 MIL/uL (ref 3.87–5.11)

## 2012-08-28 LAB — BASIC METABOLIC PANEL
BUN: 39 mg/dL — ABNORMAL HIGH (ref 6–23)
CO2: 26 mEq/L (ref 19–32)
Calcium: 9.4 mg/dL (ref 8.4–10.5)
Creatinine, Ser: 8.58 mg/dL — ABNORMAL HIGH (ref 0.50–1.10)
Glucose, Bld: 110 mg/dL — ABNORMAL HIGH (ref 70–99)

## 2012-08-28 LAB — GLUCOSE, CAPILLARY: Glucose-Capillary: 82 mg/dL (ref 70–99)

## 2012-08-28 MED ORDER — LEVOFLOXACIN 750 MG PO TABS
750.0000 mg | ORAL_TABLET | Freq: Once | ORAL | Status: AC
  Administered 2012-08-28: 750 mg via ORAL
  Filled 2012-08-28: qty 1

## 2012-08-28 MED ORDER — LEVOFLOXACIN 500 MG PO TABS
500.0000 mg | ORAL_TABLET | ORAL | Status: DC
  Filled 2012-08-28: qty 1

## 2012-08-28 NOTE — Progress Notes (Signed)
[facility]             Progress Note   Subjective: Feels much better, denies any active complaints.  Objective: Vital signs in last 24 hours: Temp:  [98 F (36.7 C)-98.6 F (37 C)] 98 F (36.7 C) (12/25 1345) Pulse Rate:  [86-97] 87  (12/25 1345) Resp:  [18-20] 18  (12/25 1345) BP: (136-168)/(72-90) 152/79 mmHg (12/25 1345) SpO2:  [88 %-100 %] 100 % (12/25 1345) Weight:  [60.2 kg (132 lb 11.5 oz)] 60.2 kg (132 lb 11.5 oz) (12/24 2200) Weight change: 1.2 kg (2 lb 10.3 oz) Last BM Date: 08/25/12  Intake/Output from previous day:   Total I/O In: 120 [P.O.:120] Out: -    Physical Exam: General: Alert, awake, ill appearing, oriented x3, with chills. HEENT: No bruits, no goiter. Heart: Regular rate and rhythm, without murmurs, rubs, gallops. Lungs: scattered ronchi at bases Abdomen: Soft, tender in lower quadrant, nondistended, positive bowel sounds. Extremities: bilateral amputee; left BKA right AKA; no edema, well healed Neuro: Grossly intact, nonfocal.    Lab Results: Basic Metabolic Panel:  Basename 08/28/12 0440 08/26/12 0723  NA 132* 134*  K 3.5 4.2  CL 89* 92*  CO2 26 21  GLUCOSE 110* 77  BUN 39* 42*  CREATININE 8.58* 9.65*  CALCIUM 9.4 8.8  MG -- --  PHOS -- 6.3*   Liver Function Tests:  Basename 08/26/12 0723  AST --  ALT --  ALKPHOS --  BILITOT --  PROT --  ALBUMIN 2.7*   No results found for this basename: LIPASE:2,AMYLASE:2 in the last 72 hours No results found for this basename: AMMONIA:2 in the last 72 hours CBC:  Basename 08/28/12 0440 08/27/12 0450  WBC 6.0 6.6  NEUTROABS -- --  HGB 10.9* 11.8*  HCT 33.0* 35.8*  MCV 84.2 85.0  PLT 221 217   Cardiac Enzymes: No results found for this basename: CKTOTAL:3,CKMB:3,CKMBINDEX:3,TROPONINI:3 in the last 72 hours BNP: No results found for this basename: PROBNP:3 in the last 72 hours D-Dimer: No results found for this basename: DDIMER:2 in the last 72  hours CBG:  Basename 08/28/12 1205 08/28/12 0734 08/27/12 2120 08/27/12 1706 08/27/12 1128 08/27/12 0721  GLUCAP 90 102* 85 83 127* 93   Hemoglobin A1C: No results found for this basename: HGBA1C in the last 72 hours Fasting Lipid Panel: No results found for this basename: CHOL,HDL,LDLCALC,TRIG,CHOLHDL,LDLDIRECT in the last 72 hours Thyroid Function Tests: No results found for this basename: TSH,T4TOTAL,FREET4,T3FREE,THYROIDAB in the last 72 hours Anemia Panel: No results found for this basename: VITAMINB12,FOLATE,FERRITIN,TIBC,IRON,RETICCTPCT in the last 72 hours Coagulation: No results found for this basename: LABPROT:2,INR:2 in the last 72 hours Urine Drug Screen: Drugs of Abuse     Component Value Date/Time   LABOPIA NONE DETECTED 06/23/2008 0529   COCAINSCRNUR NONE DETECTED 06/23/2008 0529   LABBENZ NONE DETECTED 06/23/2008 0529   AMPHETMU NONE DETECTED 06/23/2008 0529   THCU NONE DETECTED 06/23/2008 0529   LABBARB  Value: NONE DETECTED        DRUG SCREEN FOR MEDICAL PURPOSES ONLY.  IF CONFIRMATION IS NEEDED FOR ANY PURPOSE, NOTIFY LAB WITHIN 5 DAYS. 06/23/2008 0529    Alcohol Level: No results found for this basename: ETH:2 in the last 72 hours Urinalysis: No results found for this basename: COLORURINE:2,APPERANCEUR:2,LABSPEC:2,PHURINE:2,GLUCOSEU:2,HGBUR:2,BILIRUBINUR:2,KETONESUR:2,PROTEINUR:2,UROBILINOGEN:2,NITRITE:2,LEUKOCYTESUR:2 in the last 72 hours  Recent Results (from the past 240 hour(s))  CULTURE, BLOOD (SINGLE)     Status: Normal (Preliminary result)   Collection Time   08/24/12  2:00 AM  Component Value Range Status Comment   Specimen Description BLOOD RIGHT HAND   Final    Special Requests BOTTLES DRAWN AEROBIC AND ANAEROBIC 5CC EACH   Final    Culture  Setup Time 08/24/2012 08:12   Final    Culture     Final    Value:        BLOOD CULTURE RECEIVED NO GROWTH TO DATE CULTURE WILL BE HELD FOR 5 DAYS BEFORE ISSUING A FINAL NEGATIVE REPORT   Report Status  PENDING   Incomplete   CULTURE, BLOOD (ROUTINE X 2)     Status: Normal (Preliminary result)   Collection Time   08/24/12  2:45 PM      Component Value Range Status Comment   Specimen Description BLOOD RIGHT HAND   Final    Special Requests BOTTLES DRAWN AEROBIC ONLY 10CC   Final    Culture  Setup Time 08/24/2012 23:24   Final    Culture     Final    Value:        BLOOD CULTURE RECEIVED NO GROWTH TO DATE CULTURE WILL BE HELD FOR 5 DAYS BEFORE ISSUING A FINAL NEGATIVE REPORT   Report Status PENDING   Incomplete   CULTURE, EXPECTORATED SPUTUM-ASSESSMENT     Status: Normal   Collection Time   08/24/12  5:29 PM      Component Value Range Status Comment   Specimen Description SPUTUM   Final    Special Requests NONE   Final    Sputum evaluation     Final    Value: MICROSCOPIC FINDINGS SUGGEST THAT THIS SPECIMEN IS NOT REPRESENTATIVE OF LOWER RESPIRATORY SECRETIONS. PLEASE RECOLLECT.     CALLED TO MARSHALL RN 08/24/12 1935 WOOTEN,K   Report Status 08/24/2012 FINAL   Final     Studies/Results: Ct Abdomen Pelvis W Contrast  08/27/2012  *RADIOLOGY REPORT*  Clinical Data: Abdominal pain.  Vomiting.  Lower abdominal tenderness.  CT ABDOMEN AND PELVIS WITH CONTRAST  Technique:  Multidetector CT imaging of the abdomen and pelvis was performed following the standard protocol during bolus administration of intravenous contrast.  Contrast: 80mL OMNIPAQUE IOHEXOL 300 MG/ML  SOLN  Comparison: 08/12/2012.  Findings: Lung Bases: Cardiomegaly.  Atelectasis.  Liver:  Unchanged low attenuation lesions in the left and right hepatic lobes compared to prior, probably representing cysts.  Spleen:  Normal.  Gallbladder:  Normal.  Common bile duct:  Normal.  Pancreas:  Normal.  Adrenal glands:  Normal.  Kidneys:  Renal atrophy and renal cystic disease, with an appearance compatible with dialysis related renal cystic disease. The ureters appear within normal limits.  Stomach:  Small hiatal hernia.  Thickening of the  distal esophagus suggest gastroesophageal reflux although nonspecific.  Small bowel:  No inflammatory changes.  No mesenteric adenopathy. No obstruction.  Colon:   Normal appendix.  No inflammatory changes of colon.  Pelvic Genitourinary:  Hysterectomy.  No free fluid.  Urinary bladder decompressed.  Bones:  Dense appearance of the bones compatible with renal osteodystrophy.  Vasculature: Atherosclerosis.  No acute vascular abnormality.  IMPRESSION:  1.  No acute abnormality. 2.  Small hiatal hernia and thickening of the distal esophagus, most commonly associated with gastroesophageal reflux.  3.  Cardiomegaly. 4.  Unchanged low density hepatic lesions, probably representing cysts. 5.  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 6.  Hysterectomy.   Original Report Authenticated By: Andreas Newport, M.D.     Medications: Scheduled Meds:    .  amLODipine  10 mg Oral QHS  . calcium acetate (Phos Binder)  1,334 mg Oral TID WC  . ceFEPime (MAXIPIME) IV  2 g Intravenous Q T,Th,Sa-HD  . ceFEPime (MAXIPIME) IV  2 g Intravenous Q Mon-HD  . cinacalcet  60 mg Oral Daily  . darbepoetin (ARANESP) injection - DIALYSIS  60 mcg Intravenous Q Sat-HD  . famotidine  20 mg Oral QHS  . guaiFENesin  600 mg Oral BID  . insulin aspart  0-5 Units Subcutaneous QHS  . insulin aspart  0-9 Units Subcutaneous TID WC  . insulin glargine  10 Units Subcutaneous QHS  . levalbuterol  0.63 mg Nebulization TID  . levofloxacin (LEVAQUIN) IV  500 mg Intravenous Q48H  . losartan  50 mg Oral QHS  . paricalcitol  17 mcg Intravenous Q T,Th,Sa-HD   Continuous Infusions:  PRN Meds:.acetaminophen, guaiFENesin-dextromethorphan, ondansetron (ZOFRAN) IV, oxyCODONE-acetaminophen, promethazine  Assessment/Plan:  1. Acute hypoxic resp failure: due to fluid overload, pneumonia, off BIPAP  2. Health care assoc pneumonia: On vancomycin, cefepime and Levaquin. I will be escalated antibiotics and continue  only Levaquin. Blood cultures negative so far Xopenex nebs, mucinex Fevers down  3. ESRD with volume overload: HD  per renal, mild noncardiogenic pulmonary edema secondary to fluid overload  4. Abd tenderness today with vomiting : will check CT abd, change diet to clears  5.HTN (hypertension)-accelerated/uncontrolled at admission: improved with HD, resumed amlodpine, losartan  6. DM (diabetes mellitus): stable, lantus and SSI  7. Anemia: from CKD and recent GI bleed, Hb trended down, transfused 2 Units PRBC 12/23 with HD, denies  further bleeding, CBC in am  8. PVD: bilateral amputee  DVT proph: lovenox FULL CODE Disposition: inpatient, can be discharged in a.m. after dialysis if okay with nephrology.  Time spent coordinating care:   LOS: 4 days   [facility] Pager: [phone] 08/28/2012, 2:19 PM

## 2012-08-28 NOTE — Progress Notes (Signed)
Subjective:   No current complaints, says breathing better.   Objective: Vital signs in last 24 hours: Temp:  [98.2 F (36.8 C)-98.6 F (37 C)] 98.4 F (36.9 C) (12/25 0600) Pulse Rate:  [88-100] 89  (12/25 0600) Resp:  [18-20] 18  (12/25 0600) BP: (141-168)/(68-90) 141/80 mmHg (12/25 0600) SpO2:  [88 %-100 %] 93 % (12/25 0600) Weight:  [60.2 kg (132 lb 11.5 oz)] 60.2 kg (132 lb 11.5 oz) (12/24 2200) Weight change: 1.2 kg (2 lb 10.3 oz)  Intake/Output from previous day:   Intake/Output this shift:   EXAM: General appearance:  Alert, in no apparent distress Resp:  CTA without rales, rhonchi, or wheezes Cardio:  RRR without murmur or rub GI:  + BS, soft and nontender Extremities:  Right AKA, left BKA, no edema Access:  AVF @ LUA with + bruit  Lab Results:  Basename 08/28/12 0440 08/27/12 0450  WBC 6.0 6.6  HGB 10.9* 11.8*  HCT 33.0* 35.8*  PLT 221 217   BMET:   Basename 08/28/12 0440 08/26/12 0723  NA 132* 134*  K 3.5 4.2  CL 89* 92*  CO2 26 21  GLUCOSE 110* 77  BUN 39* 42*  CREATININE 8.58* 9.65*  CALCIUM 9.4 8.8  ALBUMIN -- 2.7*   No results found for this basename: PTH:2 in the last 72 hours Iron Studies: No results found for this basename: IRON,TIBC,TRANSFERRIN,FERRITIN in the last 72 hours  Dialysis Orders: Center: [place] on TTS  EDW 61 HD Bath Time 4 Heparin- "tight" exact dose not available - was to have increased to 3000 12/26 . Access left AVF400/600Zemplar 18 mcg IV/HD Epogen 10K Units IV/HD Venofer none- ferritin last 1100 in November  Assessment/Plan: 1. SOB - secondary to PNA and pulmonary edema; improved s/p emergent HD 12/21 due to volume, on empiric IV Maxipime, Vanc, and Levaquin. Per primary team possible change to PO ABX 2. ESRD - HD on MTTS @ [place]; pre-HD K 4.2 yesterday. Had HD on 12/23 as well ; planning for next on 12/26. Runs 4 times a week normally 3. Hypertension/volume - BP 162/81, on Norvasc 10 mg qd and Losartan 50 mg qd; wt 59.5 kg  s/p net UF 2.9 L; decrease volume will help BP.  4. Anemia - Hgb back up after received 2 U PRBCs with HD 12/23; s/p transfusion on 12/10 during hospitalization for GI bleeding (EGD and colonoscopy without source), no noticeable bleeding since discharge on 12/11.   5. Metabolic bone disease - Ca 8.8 (9.8 corrected), P 6.3; on Zemplar 18 mcg; Sensipar 60 mg qd and 10 phoslyra ac at home.  6. Nutrition - Alb 2.7, renal diet  7. DM - continue Lantus and SSI.  8. PVD - bilateral amputee.  9. Dispo- primary indicating that will be changed to PO abx today.  Plan for HD in AM.  ?possible discharge after HD tomorrow if continues to improve ?    LOS: 4 days   Chayson Charters A 08/28/2012,8:29 AM

## 2012-08-29 DIAGNOSIS — I2510 Atherosclerotic heart disease of native coronary artery without angina pectoris: Secondary | ICD-10-CM

## 2012-08-29 LAB — CBC
Hemoglobin: 10.7 g/dL — ABNORMAL LOW (ref 12.0–15.0)
MCH: 27.8 pg (ref 26.0–34.0)
MCV: 83.6 fL (ref 78.0–100.0)
Platelets: 218 10*3/uL (ref 150–400)
RBC: 3.85 MIL/uL — ABNORMAL LOW (ref 3.87–5.11)
WBC: 7.9 10*3/uL (ref 4.0–10.5)

## 2012-08-29 LAB — RENAL FUNCTION PANEL
CO2: 22 mEq/L (ref 19–32)
Calcium: 8.9 mg/dL (ref 8.4–10.5)
Chloride: 89 mEq/L — ABNORMAL LOW (ref 96–112)
Creatinine, Ser: 11.69 mg/dL — ABNORMAL HIGH (ref 0.50–1.10)
GFR calc Af Amer: 4 mL/min — ABNORMAL LOW (ref >90)
GFR calc non Af Amer: 3 mL/min — ABNORMAL LOW (ref >90)
Glucose, Bld: 132 mg/dL — ABNORMAL HIGH (ref 70–99)
Sodium: 132 mEq/L — ABNORMAL LOW (ref 135–145)

## 2012-08-29 LAB — GLUCOSE, CAPILLARY
Glucose-Capillary: 102 mg/dL — ABNORMAL HIGH (ref 70–99)
Glucose-Capillary: 104 mg/dL — ABNORMAL HIGH (ref 70–99)

## 2012-08-29 MED ORDER — SODIUM CHLORIDE 0.9 % IV SOLN: 100.0000 mL | INTRAVENOUS | Status: DC | PRN

## 2012-08-29 MED ORDER — PARICALCITOL 5 MCG/ML IV SOLN
INTRAVENOUS | Status: AC
  Administered 2012-08-29: 17 ug via INTRAVENOUS
  Filled 2012-08-29: qty 4

## 2012-08-29 MED ORDER — ALTEPLASE 2 MG IJ SOLR: 2.0000 mg | Freq: Once | INTRAMUSCULAR | Status: DC | PRN

## 2012-08-29 MED ORDER — OXYCODONE-ACETAMINOPHEN 5-325 MG PO TABS
ORAL_TABLET | ORAL | Status: AC
  Administered 2012-08-29: 1 via ORAL
  Filled 2012-08-29: qty 1

## 2012-08-29 MED ORDER — NEPRO/CARBSTEADY PO LIQD: 237.0000 mL | ORAL | Status: DC | PRN

## 2012-08-29 MED ORDER — HEPARIN SODIUM (PORCINE) 1000 UNIT/ML DIALYSIS: 1000.0000 [IU] | INTRAMUSCULAR | Status: DC | PRN

## 2012-08-29 MED ORDER — PENTAFLUOROPROP-TETRAFLUOROETH EX AERO: 1.0000 "application " | INHALATION_SPRAY | CUTANEOUS | Status: DC | PRN

## 2012-08-29 MED ORDER — HEPARIN SODIUM (PORCINE) 1000 UNIT/ML DIALYSIS
20.0000 [IU]/kg | INTRAMUSCULAR | Status: DC | PRN
  Administered 2012-08-29: 1200 [IU] via INTRAVENOUS_CENTRAL

## 2012-08-29 MED ORDER — LIDOCAINE HCL (PF) 1 % IJ SOLN: 5.0000 mL | INTRAMUSCULAR | Status: DC | PRN

## 2012-08-29 MED ORDER — LIDOCAINE-PRILOCAINE 2.5-2.5 % EX CREA: 1.0000 "application " | TOPICAL_CREAM | CUTANEOUS | Status: DC | PRN

## 2012-08-29 NOTE — Progress Notes (Signed)
[facility]             Progress Note   Subjective: Seen while having dialysis earlier today. Seems irritable and confused, I saw her again about 2 PM she is better, awake, alert and oriented x3.  Objective: Vital signs in last 24 hours: Temp:  [97.1 F (36.2 C)-98.8 F (37.1 C)] 98.8 F (37.1 C) (12/26 1248) Pulse Rate:  [84-118] 98  (12/26 1248) Resp:  [16-28] 18  (12/26 1248) BP: (82-159)/(32-85) 131/76 mmHg (12/26 1248) SpO2:  [91 %-100 %] 97 % (12/26 1335) Weight:  [58.2 kg (128 lb 4.9 oz)-62.279 kg (137 lb 4.8 oz)] 58.2 kg (128 lb 4.9 oz) (12/26 1120) Weight change: 2.079 kg (4 lb 9.3 oz) Last BM Date: 08/25/12  Intake/Output from previous day: 12/25 0701 - 12/26 0700 In: 540 [P.O.:540] Out: -  Total I/O In: -  Out: 2350 [Other:2350]   Physical Exam: General: Alert, awake, ill appearing, oriented x3, with chills. HEENT: No bruits, no goiter. Heart: Regular rate and rhythm, without murmurs, rubs, gallops. Lungs: scattered ronchi at bases Abdomen: Soft, tender in lower quadrant, nondistended, positive bowel sounds. Extremities: bilateral amputee; left BKA right AKA; no edema, well healed Neuro: Grossly intact, nonfocal.    Lab Results: Basic Metabolic Panel:  Basename 08/29/12 0702 08/28/12 0440  NA 132* 132*  K 4.1 3.5  CL 89* 89*  CO2 22 26  GLUCOSE 132* 110*  BUN 59* 39*  CREATININE 11.69* 8.58*  CALCIUM 8.9 9.4  MG -- --  PHOS 6.7* --   Liver Function Tests:  Basename 08/29/12 0702  AST --  ALT --  ALKPHOS --  BILITOT --  PROT --  ALBUMIN 2.6*   No results found for this basename: LIPASE:2,AMYLASE:2 in the last 72 hours No results found for this basename: AMMONIA:2 in the last 72 hours CBC:  Basename 08/29/12 0702 08/28/12 0440  WBC 7.9 6.0  NEUTROABS -- --  HGB 10.7* 10.9*  HCT 32.2* 33.0*  MCV 83.6 84.2  PLT 218 221   Cardiac Enzymes: No results found for this basename: CKTOTAL:3,CKMB:3,CKMBINDEX:3,TROPONINI:3 in  the last 72 hours BNP: No results found for this basename: PROBNP:3 in the last 72 hours D-Dimer: No results found for this basename: DDIMER:2 in the last 72 hours CBG:  Basename 08/29/12 1213 08/28/12 2142 08/28/12 1632 08/28/12 1205 08/28/12 0734 08/27/12 2120  GLUCAP 90 108* 82 90 102* 85   Hemoglobin A1C: No results found for this basename: HGBA1C in the last 72 hours Fasting Lipid Panel: No results found for this basename: CHOL,HDL,LDLCALC,TRIG,CHOLHDL,LDLDIRECT in the last 72 hours Thyroid Function Tests: No results found for this basename: TSH,T4TOTAL,FREET4,T3FREE,THYROIDAB in the last 72 hours Anemia Panel: No results found for this basename: VITAMINB12,FOLATE,FERRITIN,TIBC,IRON,RETICCTPCT in the last 72 hours Coagulation: No results found for this basename: LABPROT:2,INR:2 in the last 72 hours Urine Drug Screen: Drugs of Abuse     Component Value Date/Time   LABOPIA NONE DETECTED 06/23/2008 0529   COCAINSCRNUR NONE DETECTED 06/23/2008 0529   LABBENZ NONE DETECTED 06/23/2008 0529   AMPHETMU NONE DETECTED 06/23/2008 0529   THCU NONE DETECTED 06/23/2008 0529   LABBARB  Value: NONE DETECTED        DRUG SCREEN FOR MEDICAL PURPOSES ONLY.  IF CONFIRMATION IS NEEDED FOR ANY PURPOSE, NOTIFY LAB WITHIN 5 DAYS. 06/23/2008 0529    Alcohol Level: No results found for this basename: ETH:2 in the last 72 hours Urinalysis: No results found for this basename: COLORURINE:2,APPERANCEUR:2,LABSPEC:2,PHURINE:2,GLUCOSEU:2,HGBUR:2,BILIRUBINUR:2,KETONESUR:2,PROTEINUR:2,UROBILINOGEN:2,NITRITE:2,LEUKOCYTESUR:2 in the last 72  hours  Recent Results (from the past 240 hour(s))  CULTURE, BLOOD (SINGLE)     Status: Normal (Preliminary result)   Collection Time   08/24/12  2:00 AM      Component Value Range Status Comment   Specimen Description BLOOD RIGHT HAND   Final    Special Requests BOTTLES DRAWN AEROBIC AND ANAEROBIC 5CC EACH   Final    Culture  Setup Time 08/24/2012 08:12   Final     Culture     Final    Value:        BLOOD CULTURE RECEIVED NO GROWTH TO DATE CULTURE WILL BE HELD FOR 5 DAYS BEFORE ISSUING A FINAL NEGATIVE REPORT   Report Status PENDING   Incomplete   CULTURE, BLOOD (ROUTINE X 2)     Status: Normal (Preliminary result)   Collection Time   08/24/12  2:45 PM      Component Value Range Status Comment   Specimen Description BLOOD RIGHT HAND   Final    Special Requests BOTTLES DRAWN AEROBIC ONLY 10CC   Final    Culture  Setup Time 08/24/2012 23:24   Final    Culture     Final    Value:        BLOOD CULTURE RECEIVED NO GROWTH TO DATE CULTURE WILL BE HELD FOR 5 DAYS BEFORE ISSUING A FINAL NEGATIVE REPORT   Report Status PENDING   Incomplete   CULTURE, EXPECTORATED SPUTUM-ASSESSMENT     Status: Normal   Collection Time   08/24/12  5:29 PM      Component Value Range Status Comment   Specimen Description SPUTUM   Final    Special Requests NONE   Final    Sputum evaluation     Final    Value: MICROSCOPIC FINDINGS SUGGEST THAT THIS SPECIMEN IS NOT REPRESENTATIVE OF LOWER RESPIRATORY SECRETIONS. PLEASE RECOLLECT.     CALLED TO MARSHALL RN 08/24/12 1935 WOOTEN,K   Report Status 08/24/2012 FINAL   Final     Studies/Results: Ct Abdomen Pelvis W Contrast  08/27/2012  *RADIOLOGY REPORT*  Clinical Data: Abdominal pain.  Vomiting.  Lower abdominal tenderness.  CT ABDOMEN AND PELVIS WITH CONTRAST  Technique:  Multidetector CT imaging of the abdomen and pelvis was performed following the standard protocol during bolus administration of intravenous contrast.  Contrast: 80mL OMNIPAQUE IOHEXOL 300 MG/ML  SOLN  Comparison: 08/12/2012.  Findings: Lung Bases: Cardiomegaly.  Atelectasis.  Liver:  Unchanged low attenuation lesions in the left and right hepatic lobes compared to prior, probably representing cysts.  Spleen:  Normal.  Gallbladder:  Normal.  Common bile duct:  Normal.  Pancreas:  Normal.  Adrenal glands:  Normal.  Kidneys:  Renal atrophy and renal cystic disease, with  an appearance compatible with dialysis related renal cystic disease. The ureters appear within normal limits.  Stomach:  Small hiatal hernia.  Thickening of the distal esophagus suggest gastroesophageal reflux although nonspecific.  Small bowel:  No inflammatory changes.  No mesenteric adenopathy. No obstruction.  Colon:   Normal appendix.  No inflammatory changes of colon.  Pelvic Genitourinary:  Hysterectomy.  No free fluid.  Urinary bladder decompressed.  Bones:  Dense appearance of the bones compatible with renal osteodystrophy.  Vasculature: Atherosclerosis.  No acute vascular abnormality.  IMPRESSION:  1.  No acute abnormality. 2.  Small hiatal hernia and thickening of the distal esophagus, most commonly associated with gastroesophageal reflux.  3.  Cardiomegaly. 4.  Unchanged low density hepatic lesions, probably representing cysts.  5.  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 6.  Hysterectomy.   Original Report Authenticated By: Andreas Newport, M.D.     Medications: Scheduled Meds:    . amLODipine  10 mg Oral QHS  . calcium acetate (Phos Binder)  1,334 mg Oral TID WC  . cinacalcet  60 mg Oral Daily  . darbepoetin (ARANESP) injection - DIALYSIS  60 mcg Intravenous Q Sat-HD  . famotidine  20 mg Oral QHS  . guaiFENesin  600 mg Oral BID  . insulin aspart  0-5 Units Subcutaneous QHS  . insulin aspart  0-9 Units Subcutaneous TID WC  . insulin glargine  10 Units Subcutaneous QHS  . levalbuterol  0.63 mg Nebulization TID  . levofloxacin  500 mg Oral Q48H  . losartan  50 mg Oral QHS  . paricalcitol  17 mcg Intravenous Q T,Th,Sa-HD   Continuous Infusions:  PRN Meds:.acetaminophen, guaiFENesin-dextromethorphan, ondansetron (ZOFRAN) IV, oxyCODONE-acetaminophen, promethazine  Assessment/Plan:  1. Acute hypoxic resp failure: due to fluid overload, pneumonia, off BIPAP  2. Health care assoc pneumonia: On vancomycin, cefepime and Levaquin. I will be  escalated antibiotics and continue only Levaquin. Blood cultures negative so far Xopenex nebs, mucinex Fever is down Developed some confusion this morning but it resolved, likely secondary to oxycodone.  3. ESRD with volume overload: HD  per renal, mild noncardiogenic pulmonary edema secondary to fluid overload  4. Abd tenderness today with vomiting : will check CT abd, change diet to clears  5.HTN (hypertension)-accelerated/uncontrolled at admission: improved with HD, resumed amlodpine, losartan  6. DM (diabetes mellitus): stable, lantus and SSI  7. Anemia: from CKD and recent GI bleed, Hb trended down, transfused 2 Units PRBC 12/23 with HD, denies  further bleeding, CBC in am  8. PVD: bilateral amputee  DVT proph: lovenox FULL CODE Disposition: inpatient, can be discharged in a.m. 08/30/2012  Time spent coordinating care:   LOS: 5 days   [facility] A [facility] Pager: [phone] 08/29/2012, 2:15 PM

## 2012-08-29 NOTE — Progress Notes (Signed)
Pt just vomited. I did not see the emesis, but daughter said it was not food, just phlegm. I offered pt Zofran, but pt declined, says that typically makes her more nauseated. MD discontinued phenergan due to AMS earlier this afternoon. Offered pt a ginger ale, will continue to monitor.

## 2012-08-29 NOTE — Procedures (Addendum)
I have seen and examined this patient and agree with the plan of care, low blood pressure will reduce goal to 3 L  Hurbert Duran W 08/29/2012, 9:13 AM

## 2012-08-29 NOTE — Progress Notes (Signed)
Upon returning from dialysis, pt was exhibiting some confusion. Pt was able to answer orientation questions correctly, but other questions she answered inappropriately (ex: answered "ok" to yes, no questions). Pt also had an episode of bowel incontinence, which is unusual for pt. Her vital signs are stable. No asymmetrical movement. No complaints of pain, is grimacing, but cannot tell me if she is in pain. MD notified. Will continue to monitor. Oriented pt to her surroundings, opened blinds, family at bedside.

## 2012-08-30 LAB — CULTURE, BLOOD (ROUTINE X 2): Culture: NO GROWTH

## 2012-08-30 LAB — CULTURE, BLOOD (SINGLE)

## 2012-08-30 MED ORDER — LEVOFLOXACIN 500 MG PO TABS: 500.0000 mg | ORAL_TABLET | ORAL | Status: DC

## 2012-08-30 NOTE — Progress Notes (Signed)
Discussed discharge instructions with pt. Pt showed no barriers to discharge. IV removed. Tele removed. Assessment unchanged from morning. Pt discharged to home with daughter.

## 2012-08-30 NOTE — Progress Notes (Signed)
Pt wants me to wait, bathing

## 2012-08-30 NOTE — Progress Notes (Signed)
I have seen and examined this patient and agree with the plan of care . Pulmonary edema improved.  Dalbert Stillings W 08/30/2012, 9:50 AM

## 2012-08-30 NOTE — Progress Notes (Signed)
Subjective:   Sitting in middle of bed, feeling much better, no nausea or vomiting since yesterday.  Objective: Vital signs in last 24 hours: Temp:  [98.1 F (36.7 C)-98.9 F (37.2 C)] 98.1 F (36.7 C) (12/27 0449) Pulse Rate:  [87-118] 97  (12/27 0449) Resp:  [16-28] 18  (12/27 0449) BP: (82-152)/(32-78) 131/78 mmHg (12/27 0449) SpO2:  [94 %-98 %] 96 % (12/27 0808) Weight:  [58.2 kg (128 lb 4.9 oz)] 58.2 kg (128 lb 4.9 oz) (12/26 1120) Weight change: -1.379 kg (-3 lb 0.6 oz)  Intake/Output from previous day: 12/26 0701 - 12/27 0700 In: 968 [P.O.:960; IV Piggyback:8] Out: 2350    EXAM: General appearance:  Alert, in no apparent distress Resp:  CTA without rales, rhonchi, or wheezes Cardio:  RRR without murmur or rub GI:  + BS, soft and nontender Extremities:  Right AKA, left BKA, no edema Access:  AVF @ LUA with + bruit  Lab Results:  [facility] 08/29/12 0702 08/28/12 0440  WBC 7.9 6.0  HGB 10.7* 10.9*  HCT 32.2* 33.0*  PLT 218 221   BMET:  Basename 08/29/12 0702 08/28/12 0440  NA 132* 132*  K 4.1 3.5  CL 89* 89*  CO2 22 26  GLUCOSE 132* 110*  BUN 59* 39*  CREATININE 11.69* 8.58*  CALCIUM 8.9 9.4  ALBUMIN 2.6* --   No results found for this basename: PTH:2 in the last 72 hours Iron Studies: No results found for this basename: IRON,TIBC,TRANSFERRIN,FERRITIN in the last 72 hours  Dialysis Orders: Center: [place] on TTS  EDW 61 HD Bath Time 4 Heparin- "tight" exact dose not available - was to have increased to 3000 12/26 . Access left AVF400/600Zemplar 18 mcg IV/HD Epogen 10K Units IV/HD Venofer none- ferritin last 1100 in November  Assessment/Plan: 1. SOB - secondary to PNA and pulmonary edema; improved s/p emergent HD 12/21 due to volume, now on PO  Levaquin. Per primary team. 2. ESRD - HD on MTTS @ [place]; pre-HD K 4.1 yesterday.  Next HD tomorrow. 3. Hypertension/volume - BP 131/78, on Norvasc 10 mg qd and Losartan 50 mg qd; wt 58.2 kg s/p net UF 2.4 L yesterday.    4. Anemia - Hgb 10.7, s/p  2 U PRBCs with HD 12/23; s/p transfusion on 12/10 during hospitalization for GI bleeding (EGD and colonoscopy without source), no noticeable bleeding since discharge on 12/11.  5. Metabolic bone disease - Ca 8.9 (10 corrected), P 6.7; on Zemplar 18 mcg; Sensipar 60 mg qd and 10 phoslyra ac at home.  6. Nutrition - Alb 2.6, renal diet  7. DM - continue Lantus and SSI.  8. PVD - bilateral amputee.    LOS: 6 days   [name] 08/30/2012,8:56 AM

## 2012-09-02 NOTE — Discharge Summary (Signed)
Physician Discharge Summary  [name] ZOX:[id number] DOB: [birth year] DOA: 08/24/2012  PCP: Cecille Aver, MD  Admit date: 08/24/2012 Discharge date: 09/02/2012  Time spent: 40 minutes  Recommendations for Outpatient Follow-up:  1. followup primary care physician one week)  Discharge Diagnoses:  Principal Problem:  *HAP (hospital-acquired pneumonia) Active Problems:  Pulmonary edema with congestive heart failure with reduced left ventricular function  HTN (hypertension)-accelerated/uncontrolled at admission  DM (diabetes mellitus)  Anemia  Acute respiratory failure with hypoxia  ESRD on hemodialysis  Vomiting   Discharge Condition: stable  Diet recommendation: renal, low potassium diet  Filed Weights   08/28/12 2143 08/29/12 0709 08/29/12 1120  Weight: 62.279 kg (137 lb 4.8 oz) 60.9 kg (134 lb 4.2 oz) 58.2 kg (128 lb 4.9 oz)    History of present illness:  [name] is a 51 y.o. female  has a past medical history of Hypertension; Anemia; Systolic CHF, acute on chronic; Coronary artery disease; Peripheral vascular disease; CHF (congestive heart failure); Angina; Blood transfusion (~2003; 08/12/2012); Hypercholesteremia; DVT of leg (deep venous thrombosis) (2011); Shortness of breath (01/02/12); Type II diabetes mellitus; Acute lower GI bleeding (08/12/2012); and ESRD on dialysis.  Presented with  Patient was admtitted to [facility] with GI bleed 1 and a half weeks ago. This week she started to have a cough And this progressed to shortness of breath over the past 3 days. Patient denies any fever or chills. Dry cough non production. No chest pain. She presented to ER and was found to have likely PNA on CXR. Due to increased work of breathing she was put on Bipap and now doing better.  She reports getting HD on Monday, Tuesdays, Thursdays and Saturdays. Last HD was on Thursday and went well. She has Left arm graft.  She reports still making some urine once a  week.   Hospital Course:   1. Acute respiratory failure: Acute hypoxic respiratory failure, this is multifactorial but primarily secondary to pneumonia associated with fluid overload. Upon admission patient was placed on BiPAP briefly in the emergency department tell her breathing improved. Then when fluids were removed with dialysis patient breathing improved and she was placed on nasal cannula and ultimately on room air.  2. Healthcare associated pneumonia: At the time of admission to the hospital patient started on vancomycin, cefepime and Levaquin. Because patient does dialysis she was treated as healthcare associated pneumonia, blood culture did not show any growth. Patient was treated also with supportive management with inhaled Xopenex, Mucinex. She defervesced, antibiotics switched to only Levaquin. Patient to complete 10 days of antibiotics.  3. End stage renal disease: Patient came in with volume overload and mild noncardiogenic pulmonary edema secondary to that. This resolved after dialysis.  4. Diabetes mellitus: Stable, she is on Lantus insulin sliding scale. This continued throughout her hospital stay.  5. Anemia: Anemia of chronic disease secondary to the ESRD, patient has recent GI bleed, no overt bleeding this time. Her hemoglobin trended down, she had transfused 2 units of packed RBCs with hemodialysis on 08/26/2012.  6. PVD: Patient is bilateral amputee.  Procedures:  none  Consultations:  none  Discharge Exam: Filed Vitals:   08/29/12 2145 08/30/12 0449 08/30/12 0808 08/30/12 1128  BP: 152/77 131/78  158/71  Pulse: 108 97  98  Temp: 98.3 F (36.8 C) 98.1 F (36.7 C)  98.1 F (36.7 C)  TempSrc: Oral Oral    Resp: 18 18  21   Weight:      SpO2: 94%  96% 96% 98%   General: Alert and awake, oriented x3, not in any acute distress. HEENT: anicteric sclera, pupils reactive to light and accommodation, EOMI CVS: S1-S2 clear, no murmur rubs or gallops Chest: clear to  auscultation bilaterally, no wheezing, rales or rhonchi Abdomen: soft nontender, nondistended, normal bowel sounds, no organomegaly Extremities: Patient is bilateral amputee. Neuro: Cranial nerves II-XII intact, no focal neurological deficits  Discharge Instructions  Discharge Orders    Future Orders Please Complete By Expires   Diet Carb Modified      Increase activity slowly          Medication List     As of 09/02/2012  2:37 PM    TAKE these medications         amLODipine 10 MG tablet   Commonly known as: NORVASC   Take 10 mg by mouth at bedtime.      calcium acetate 667 MG capsule   Commonly known as: PHOSLO   Take 1,334 mg by mouth 3 (three) times daily with meals.      cinacalcet 60 MG tablet   Commonly known as: SENSIPAR   Take 60 mg by mouth daily.      famotidine 20 MG tablet   Commonly known as: PEPCID   Take 20 mg by mouth at bedtime. One at bedtime      insulin aspart 100 UNIT/ML injection   Commonly known as: novoLOG   Inject 1-5 Units into the skin 3 (three) times daily before meals. Sliding scale      insulin glargine 100 UNIT/ML injection   Commonly known as: LANTUS   Inject 16 Units into the skin at bedtime.      levofloxacin 500 MG tablet   Commonly known as: LEVAQUIN   Take 1 tablet (500 mg total) by mouth every other day.      losartan 50 MG tablet   Commonly known as: COZAAR   Take 50 mg by mouth at bedtime.      ondansetron 4 MG disintegrating tablet   Commonly known as: ZOFRAN-ODT   Take 1 tablet (4 mg total) by mouth every 8 (eight) hours as needed for nausea.           Follow-up Information    Follow up with GOLDSBOROUGH,KELLIE A, MD. In 1 week.   Contact information:   [address] [place] [address] [phone]           The results of significant diagnostics from this hospitalization (including imaging, microbiology, ancillary and laboratory) are listed below for reference.    Significant Diagnostic Studies: Ct  Abdomen Pelvis Wo Contrast  08/12/2012  *RADIOLOGY REPORT*  Clinical Data: Bloody stools.  Hemodialysis patient.  CT ABDOMEN AND PELVIS WITHOUT CONTRAST  Technique:  Multidetector CT imaging of the abdomen and pelvis was performed following the standard protocol without intravenous contrast.  Comparison: None.  Findings: Linear atelectasis in the lung bases.  Mild cardiac enlargement.  Calcifications in the liver and spleen likely represent granulomas. A simple appearing cysts in the liver, largest measuring 2.1 cm diameter.  The liver, spleen, gallbladder, pancreas, adrenal glands, and retroperitoneal lymph nodes are otherwise unremarkable. There is diffuse calcification of the abdominal aorta and branch vessels.  Significant calcification is present in the superior mesenteric artery.  Mesenteric stenosis is not excluded.  Bilateral renal atrophy with multiple renal cysts.  Multiple bilateral renal calcifications appear to be vascular calcifications.  No hydronephrosis.  The stomach, small bowel, and colon are not  abnormally distended and no specific wall thickening is appreciated.  No free air or free fluid in the abdomen.  Pelvis:  The uterus appears surgically absent.  No abnormal adnexal masses.  Prominence of the wall of the rectosigmoid colon could represent bowel wall thickening such as inflammatory process or could represent incomplete distension or muscular hypertrophy.  No evidence of diverticulitis.  The appendix is normal.  No free or loculated pelvic fluid collections.  Normal alignment of lumbar vertebrae with mild degenerative change.  IMPRESSION: Extensive vascular calcifications.  Bilateral renal parenchymal atrophy and multiple cysts.  Nonspecific wall thickening of the rectosigmoid colon.   Original Report Authenticated By: Burman Nieves, M.D.    Ct Abdomen Pelvis W Contrast  08/27/2012  *RADIOLOGY REPORT*  Clinical Data: Abdominal pain.  Vomiting.  Lower abdominal tenderness.  CT ABDOMEN  AND PELVIS WITH CONTRAST  Technique:  Multidetector CT imaging of the abdomen and pelvis was performed following the standard protocol during bolus administration of intravenous contrast.  Contrast: 80mL OMNIPAQUE IOHEXOL 300 MG/ML  SOLN  Comparison: 08/12/2012.  Findings: Lung Bases: Cardiomegaly.  Atelectasis.  Liver:  Unchanged low attenuation lesions in the left and right hepatic lobes compared to prior, probably representing cysts.  Spleen:  Normal.  Gallbladder:  Normal.  Common bile duct:  Normal.  Pancreas:  Normal.  Adrenal glands:  Normal.  Kidneys:  Renal atrophy and renal cystic disease, with an appearance compatible with dialysis related renal cystic disease. The ureters appear within normal limits.  Stomach:  Small hiatal hernia.  Thickening of the distal esophagus suggest gastroesophageal reflux although nonspecific.  Small bowel:  No inflammatory changes.  No mesenteric adenopathy. No obstruction.  Colon:   Normal appendix.  No inflammatory changes of colon.  Pelvic Genitourinary:  Hysterectomy.  No free fluid.  Urinary bladder decompressed.  Bones:  Dense appearance of the bones compatible with renal osteodystrophy.  Vasculature: Atherosclerosis.  No acute vascular abnormality.  IMPRESSION:  1.  No acute abnormality. 2.  Small hiatal hernia and thickening of the distal esophagus, most commonly associated with gastroesophageal reflux.  3.  Cardiomegaly. 4.  Unchanged low density hepatic lesions, probably representing cysts. 5.  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 6.  Hysterectomy.   Original Report Authenticated By: Andreas Newport, M.D.    Dg Chest Port 1 View  08/24/2012  *RADIOLOGY REPORT*  Clinical Data: Follow up pneumonia  PORTABLE CHEST - 1 VIEW  Comparison: 09/25/2011 01:10 hours  Findings: There is improved aeration in the right middle lobe with slight improvement of the infiltrates seen.  There remains mild edema bilaterally.  Cardiac shadow is  stable.  No new focal abnormality is seen.  IMPRESSION: Improved aeration in the right middle lobe.   Original Report Authenticated By: Alcide Clever, M.D.    Dg Chest Port 1 View  08/24/2012  *RADIOLOGY REPORT*  Clinical Data: Short of breath.  CHF.  End-stage renal disease. Diabetic.  PORTABLE CHEST - 1 VIEW  Comparison: None.  Findings: Right middle lobe consolidation is present, respecting minor fissure.  Cardiomegaly.  Interstitial pulmonary edema is present. Monitoring leads are projected over the chest.  Aortic arch atherosclerosis.  IMPRESSION:  1.  Right middle lobe consolidation, most compatible with pneumonia.  Asymmetric/atypical pulmonary edema considered less likely. 2.  Mild to moderate CHF with interstitial pulmonary edema diffusely and cardiomegaly.   Original Report Authenticated By: Andreas Newport, M.D.     Microbiology: Recent Results (from the past 240  hour(s))  CULTURE, BLOOD (SINGLE)     Status: Normal   Collection Time   08/24/12  2:00 AM      Component Value Range Status Comment   Specimen Description BLOOD RIGHT HAND   Final    Special Requests BOTTLES DRAWN AEROBIC AND ANAEROBIC [facility] EACH   Final    Culture  Setup Time 08/24/2012 08:12   Final    Culture NO GROWTH 5 DAYS   Final    Report Status 08/30/2012 FINAL   Final   CULTURE, BLOOD (ROUTINE X 2)     Status: Normal   Collection Time   08/24/12  2:45 PM      Component Value Range Status Comment   Specimen Description BLOOD RIGHT HAND   Final    Special Requests BOTTLES DRAWN AEROBIC ONLY 10CC   Final    Culture  Setup Time 08/24/2012 23:24   Final    Culture NO GROWTH 5 DAYS   Final    Report Status 08/30/2012 FINAL   Final   CULTURE, EXPECTORATED SPUTUM-ASSESSMENT     Status: Normal   Collection Time   08/24/12  5:29 PM      Component Value Range Status Comment   Specimen Description SPUTUM   Final    Special Requests NONE   Final    Sputum evaluation     Final    Value: MICROSCOPIC FINDINGS SUGGEST THAT  THIS SPECIMEN IS NOT REPRESENTATIVE OF LOWER RESPIRATORY SECRETIONS. PLEASE RECOLLECT.     CALLED TO MARSHALL RN 08/24/12 1935 WOOTEN,K   Report Status 08/24/2012 FINAL   Final      Labs: Basic Metabolic Panel:  Lab 08/29/12 1610 08/28/12 0440  NA 132* 132*  K 4.1 3.5  CL 89* 89*  CO2 22 26  GLUCOSE 132* 110*  BUN 59* 39*  CREATININE 11.69* 8.58*  CALCIUM 8.9 9.4  MG -- --  PHOS 6.7* --   Liver Function Tests:  Lab 08/29/12 0702  AST --  ALT --  ALKPHOS --  BILITOT --  PROT --  ALBUMIN 2.6*   No results found for this basename: LIPASE:5,AMYLASE:5 in the last 168 hours No results found for this basename: AMMONIA:5 in the last 168 hours CBC:  Lab 08/29/12 0702 08/28/12 0440 08/27/12 0450  WBC 7.9 6.0 6.6  NEUTROABS -- -- --  HGB 10.7* 10.9* 11.8*  HCT 32.2* 33.0* 35.8*  MCV 83.6 84.2 85.0  PLT 218 221 217   Cardiac Enzymes: No results found for this basename: CKTOTAL:5,CKMB:5,CKMBINDEX:5,TROPONINI:5 in the last 168 hours BNP: BNP (last 3 results)  Basename 08/24/12 1455 01/02/12 0221  PROBNP 26314.0* 16007.0*   CBG:  Lab 08/30/12 1126 08/30/12 0735 08/30/12 0145 08/29/12 2143 08/29/12 1646  GLUCAP 136* 119* 151* 104* 102*       Signed:  Chaunta Bejarano A  [facility] 09/02/2012, 2:37 PM

## 2013-03-13 ENCOUNTER — Encounter: Payer: Self-pay | Admitting: Vascular Surgery

## 2013-03-14 ENCOUNTER — Ambulatory Visit: Payer: Medicare Other | Admitting: Vascular Surgery

## 2013-03-14 ENCOUNTER — Encounter: Payer: Self-pay | Admitting: Vascular Surgery

## 2013-03-14 ENCOUNTER — Ambulatory Visit (INDEPENDENT_AMBULATORY_CARE_PROVIDER_SITE_OTHER): Payer: Medicare Other | Admitting: Vascular Surgery

## 2013-03-14 ENCOUNTER — Encounter (HOSPITAL_COMMUNITY): Payer: Self-pay | Admitting: Pharmacy Technician

## 2013-03-14 VITALS — BP 179/89 | HR 99 | Ht 62.0 in | Wt 154.0 lb

## 2013-03-14 DIAGNOSIS — N186 End stage renal disease: Secondary | ICD-10-CM

## 2013-03-14 DIAGNOSIS — T82898A Other specified complication of vascular prosthetic devices, implants and grafts, initial encounter: Secondary | ICD-10-CM | POA: Insufficient documentation

## 2013-03-14 DIAGNOSIS — T888XXA Other specified complications of surgical and medical care, not elsewhere classified, initial encounter: Secondary | ICD-10-CM

## 2013-03-14 NOTE — Progress Notes (Signed)
[facility] [place]  Established Dialysis Access  History of Present Illness  [name] is a 52 y.o. 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  The patient denies prior bleeding complications.  She notes flow rates have been okay.  The patient has had no steal sx.  She denies any sequalae from her prior valvular insufficiency.  She does not ambulate due prior amputations.  Past Medical History  Diagnosis Date  . Hypertension   . Anemia   . Systolic CHF, acute on chronic   . Coronary artery disease   . Peripheral vascular disease   . CHF (congestive heart failure)   . Angina   . Blood transfusion ~2003; 08/12/2012    "when I started on dialysis; lower gi bleeding" (08/12/2012)  . Hypercholesteremia   . DVT of leg (deep venous thrombosis) 2011    "RLE" (08/12/2012)  . Shortness of breath 01/02/12    "@rest; lying down; w/exertion"; no change 08/12/2012  . Type II diabetes mellitus   . Acute lower GI bleeding 08/12/2012  . ESRD on dialysis     "[facility], TTS hemodialysis    Past Surgical History  Procedure Laterality Date  . 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 Av fistula placement  ~ 2003    left upper arm  . Abdominal hysterectomy  1990's  . Arteriovenous graft placement  2011    left upper arm  . Vascular surgery    . Cardiac catheterization  ~ 2010  . Cesarean section  1988  . Esophagogastroduodenoscopy  08/12/2012    Procedure: ESOPHAGOGASTRODUODENOSCOPY (EGD);  Trosper: James L Edwards Jr., MD;  Location: [facility] ENDOSCOPY;  Service: Endoscopy;  Laterality: N/A;  . Colonoscopy  08/13/2012    Procedure: COLONOSCOPY;  [place]ull: James L Edwards Jr., MD;  Location: [facility] ENDOSCOPY;  Service: Endoscopy;  Laterality: N/A;    History   Social History  . Marital Status: Married    Spouse Name: N/A    Number of Children:  N/A  . Years of Education: N/A   Occupational History  . Not on file.   Social History Main Topics  . Smoking status: Never Smoker   . Smokeless tobacco: Never Used  . Alcohol Use: No  . Drug Use: No  . Sexually Active: Yes   Other Topics Concern  . Not on file   Social History Narrative  . No narrative on file    Family History  Problem Relation Age of Onset  . Breast cancer Sister   . Breast cancer Sister   . Kidney failure Mother   . Diabetes Mother   . Cancer - Other Father      Current Outpatient Prescriptions on File Prior to Visit  Medication Sig Dispense Refill  . amLODipine (NORVASC) 10 MG tablet Take 10 mg by mouth at bedtime.       . cinacalcet (SENSIPAR) 60 MG tablet Take 60 mg by mouth daily.      . famotidine (PEPCID) 20 MG tablet Take 20 mg by mouth at bedtime. One at bedtime      . insulin aspart (NOVOLOG) 100 UNIT/ML injection Inject 1-5 Units into the skin 3 (three) times daily before meals. Sliding scale      . insulin glargine (LANTUS) 100 UNIT/ML injection Inject 16 Units into the skin at bedtime.  10 mL    0  . calcium acetate (PHOSLO) 667 MG capsule Take 1,334 mg by mouth 3 (three) times daily with meals.      . levofloxacin (LEVAQUIN) 500 MG tablet Take 1 tablet (500 mg total) by mouth every other day.  3 tablet  0  . losartan (COZAAR) 50 MG tablet Take 50 mg by mouth at bedtime.      . ondansetron (ZOFRAN ODT) 4 MG disintegrating tablet Take 1 tablet (4 mg total) by mouth every 8 (eight) hours as needed for nausea.  20 tablet  0   No current facility-administered medications on file prior to visit.    No Known Allergies  REVIEW OF SYSTEMS:  (Positives checked otherwise negative)  CARDIOVASCULAR:  [] chest pain, [] chest pressure, [] palpitations, [] shortness of breath when laying flat, [] shortness of breath with exertion,  [] pain in feet when walking, [] pain in feet when laying flat, [] history of blood clot in veins (DVT), [] history of  phlebitis, [] swelling in legs, [] varicose veins  PULMONARY:  [] productive cough, [] asthma, [] wheezing  NEUROLOGIC:  [] weakness in arms or legs, [] numbness in arms or legs, [] difficulty speaking or slurred speech, [] temporary loss of vision in one eye, [] dizziness  HEMATOLOGIC:  [] bleeding problems, [] problems with blood clotting too easily  MUSCULOSKEL:  [] joint pain, [] joint swelling, [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  General: A&O x 3, WD, WN  Pulmonary: Sym exp, good air movt, CTAB, no rales, rhonchi, & wheezing  Cardiac: RRR, Nl S1, S2, no rubs or gallops, HSM IV/VI  Gastrointestinal: soft, NTND, -G/R, - HSM, - masses, - CVAT B  Musculoskeletal: M/S 5/5 throughout , Extremities without  ischemic changes except  R AKA an L BKA, palpable thrill in access , multiple large pseudoaneurysms, lateral upper PSA with compromised skin  Neurologic: Pain and light touch intact in extremities , Motor exam as listed above   Medical Decision Making  [name] is a 52 y.o. 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   I have some concern of rupture with the more proximal PSA, so I recommend plication of both sets of the pseudoaneurysm and interval placement of a tunneled dialysis catheter   I had an extensive discussion with this patient in regards to the nature of access surgery, including risk, benefits, and alternatives.    The patient is aware that the risks of access surgery include but are not limited to: bleeding, infection, steal syndrome, nerve damage,  ischemic monomelic neuropathy, failure of access to mature, and possible need for additional access procedures in the future. The patient is aware the risks of tunneled dialysis catheter placement include but are not limited to: bleeding, infection, central venous injury, pneumothorax, possible venous stenosis, possible malpositioning in the venous system, and possible infections related to long-term catheter presence.   The patient has agreed to proceed with the above procedure which will be scheduled 16 JUL 14.    Riyanna Crutchley, MD [facility] [place] Office: [phone] Pager: [phone]  03/14/2013, 12:04 PM   

## 2013-03-17 ENCOUNTER — Other Ambulatory Visit: Payer: Self-pay

## 2013-03-18 ENCOUNTER — Encounter (HOSPITAL_COMMUNITY): Payer: Self-pay | Admitting: *Deleted

## 2013-03-18 MED ORDER — CEFUROXIME SODIUM 1.5 G IJ SOLR
1.5000 g | INTRAMUSCULAR | Status: AC
  Administered 2013-03-19: 1.5 g via INTRAVENOUS
  Filled 2013-03-18 (×2): qty 1.5

## 2013-03-19 ENCOUNTER — Ambulatory Visit (HOSPITAL_COMMUNITY): Payer: Medicare Other

## 2013-03-19 ENCOUNTER — Ambulatory Visit (HOSPITAL_COMMUNITY): Payer: Medicare Other | Admitting: Anesthesiology

## 2013-03-19 ENCOUNTER — Telehealth: Payer: Self-pay | Admitting: Vascular Surgery

## 2013-03-19 ENCOUNTER — Encounter (HOSPITAL_COMMUNITY): Payer: Self-pay | Admitting: Anesthesiology

## 2013-03-19 ENCOUNTER — Ambulatory Visit (HOSPITAL_COMMUNITY)
Admission: RE | Admit: 2013-03-19 | Discharge: 2013-03-19 | Disposition: A | Discharge status: Home / Self Care | Payer: Medicare Other | Source: Ambulatory Visit | Attending: Vascular Surgery | Admitting: Vascular Surgery

## 2013-03-19 ENCOUNTER — Encounter (HOSPITAL_COMMUNITY)
Admission: RE | Disposition: A | Discharge status: Home / Self Care | Payer: Self-pay | Source: Ambulatory Visit | Attending: Vascular Surgery

## 2013-03-19 ENCOUNTER — Encounter (HOSPITAL_COMMUNITY): Payer: Self-pay | Admitting: *Deleted

## 2013-03-19 DIAGNOSIS — I82609 Acute embolism and thrombosis of unspecified veins of unspecified upper extremity: Secondary | ICD-10-CM | POA: Insufficient documentation

## 2013-03-19 DIAGNOSIS — Z86718 Personal history of other venous thrombosis and embolism: Secondary | ICD-10-CM | POA: Insufficient documentation

## 2013-03-19 DIAGNOSIS — Z794 Long term (current) use of insulin: Secondary | ICD-10-CM | POA: Insufficient documentation

## 2013-03-19 DIAGNOSIS — E119 Type 2 diabetes mellitus without complications: Secondary | ICD-10-CM | POA: Insufficient documentation

## 2013-03-19 DIAGNOSIS — N186 End stage renal disease: Secondary | ICD-10-CM | POA: Insufficient documentation

## 2013-03-19 DIAGNOSIS — T82898D Other specified complication of vascular prosthetic devices, implants and grafts, subsequent encounter: Secondary | ICD-10-CM

## 2013-03-19 DIAGNOSIS — T82898A Other specified complication of vascular prosthetic devices, implants and grafts, initial encounter: Secondary | ICD-10-CM | POA: Insufficient documentation

## 2013-03-19 DIAGNOSIS — Z79899 Other long term (current) drug therapy: Secondary | ICD-10-CM | POA: Insufficient documentation

## 2013-03-19 DIAGNOSIS — S88119A Complete traumatic amputation at level between knee and ankle, unspecified lower leg, initial encounter: Secondary | ICD-10-CM | POA: Insufficient documentation

## 2013-03-19 DIAGNOSIS — Z8701 Personal history of pneumonia (recurrent): Secondary | ICD-10-CM | POA: Insufficient documentation

## 2013-03-19 DIAGNOSIS — I2510 Atherosclerotic heart disease of native coronary artery without angina pectoris: Secondary | ICD-10-CM | POA: Insufficient documentation

## 2013-03-19 DIAGNOSIS — Z90710 Acquired absence of both cervix and uterus: Secondary | ICD-10-CM | POA: Insufficient documentation

## 2013-03-19 DIAGNOSIS — J4489 Other specified chronic obstructive pulmonary disease: Secondary | ICD-10-CM | POA: Insufficient documentation

## 2013-03-19 DIAGNOSIS — S78119A Complete traumatic amputation at level between unspecified hip and knee, initial encounter: Secondary | ICD-10-CM | POA: Insufficient documentation

## 2013-03-19 DIAGNOSIS — I120 Hypertensive chronic kidney disease with stage 5 chronic kidney disease or end stage renal disease: Secondary | ICD-10-CM | POA: Insufficient documentation

## 2013-03-19 DIAGNOSIS — Z992 Dependence on renal dialysis: Secondary | ICD-10-CM | POA: Insufficient documentation

## 2013-03-19 DIAGNOSIS — I999 Unspecified disorder of circulatory system: Secondary | ICD-10-CM | POA: Insufficient documentation

## 2013-03-19 DIAGNOSIS — I739 Peripheral vascular disease, unspecified: Secondary | ICD-10-CM | POA: Insufficient documentation

## 2013-03-19 DIAGNOSIS — Y832 Surgical operation with anastomosis, bypass or graft as the cause of abnormal reaction of the patient, or of later complication, without mention of misadventure at the time of the procedure: Secondary | ICD-10-CM | POA: Insufficient documentation

## 2013-03-19 DIAGNOSIS — J449 Chronic obstructive pulmonary disease, unspecified: Secondary | ICD-10-CM | POA: Insufficient documentation

## 2013-03-19 DIAGNOSIS — I509 Heart failure, unspecified: Secondary | ICD-10-CM | POA: Insufficient documentation

## 2013-03-19 DIAGNOSIS — I209 Angina pectoris, unspecified: Secondary | ICD-10-CM | POA: Insufficient documentation

## 2013-03-19 DIAGNOSIS — I5023 Acute on chronic systolic (congestive) heart failure: Secondary | ICD-10-CM | POA: Insufficient documentation

## 2013-03-19 DIAGNOSIS — D649 Anemia, unspecified: Secondary | ICD-10-CM | POA: Insufficient documentation

## 2013-03-19 DIAGNOSIS — R011 Cardiac murmur, unspecified: Secondary | ICD-10-CM | POA: Insufficient documentation

## 2013-03-19 HISTORY — DX: Cardiac murmur, unspecified: R01.1

## 2013-03-19 HISTORY — PX: REVISION OF ARTERIOVENOUS GORETEX GRAFT: SHX6073

## 2013-03-19 HISTORY — DX: Sickle-cell trait: D57.3

## 2013-03-19 HISTORY — PX: RESECTION OF ARTERIOVENOUS FISTULA ANEURYSM: SHX6070

## 2013-03-19 HISTORY — DX: Chronic obstructive pulmonary disease, unspecified: J44.9

## 2013-03-19 HISTORY — PX: INSERTION OF DIALYSIS CATHETER: SHX1324

## 2013-03-19 HISTORY — DX: Pneumonia, unspecified organism: J18.9

## 2013-03-19 LAB — POCT I-STAT 4, (NA,K, GLUC, HGB,HCT)
Glucose, Bld: 160 mg/dL — ABNORMAL HIGH (ref 70–99)
HCT: 38 % (ref 36.0–46.0)
Potassium: 3.3 mEq/L — ABNORMAL LOW (ref 3.5–5.1)

## 2013-03-19 LAB — GLUCOSE, CAPILLARY

## 2013-03-19 SURGERY — RESECTION OF ARTERIOVENOUS FISTULA ANEURYSM
Anesthesia: General | Site: Neck | Wound class: Clean

## 2013-03-19 MED ORDER — NEOSTIGMINE METHYLSULFATE 1 MG/ML IJ SOLN
INTRAMUSCULAR | Status: DC | PRN
  Administered 2013-03-19: 5 mg via INTRAVENOUS

## 2013-03-19 MED ORDER — SUCCINYLCHOLINE CHLORIDE 20 MG/ML IJ SOLN
INTRAMUSCULAR | Status: DC | PRN
  Administered 2013-03-19: 120 mg via INTRAVENOUS

## 2013-03-19 MED ORDER — LIDOCAINE HCL (PF) 1 % IJ SOLN
INTRAMUSCULAR | Status: AC
  Filled 2013-03-19: qty 30

## 2013-03-19 MED ORDER — MIDAZOLAM HCL 5 MG/5ML IJ SOLN
INTRAMUSCULAR | Status: DC | PRN
  Administered 2013-03-19: 1 mg via INTRAVENOUS

## 2013-03-19 MED ORDER — HEPARIN SODIUM (PORCINE) 1000 UNIT/ML IJ SOLN
INTRAMUSCULAR | Status: AC
  Filled 2013-03-19: qty 1

## 2013-03-19 MED ORDER — HEPARIN SODIUM (PORCINE) 1000 UNIT/ML IJ SOLN
INTRAMUSCULAR | Status: DC | PRN
  Administered 2013-03-19: 1000 [IU]

## 2013-03-19 MED ORDER — ARTIFICIAL TEARS OP OINT
TOPICAL_OINTMENT | OPHTHALMIC | Status: DC | PRN
  Administered 2013-03-19: 1 via OPHTHALMIC

## 2013-03-19 MED ORDER — LIDOCAINE-EPINEPHRINE (PF) 1 %-1:200000 IJ SOLN
INTRAMUSCULAR | Status: AC
  Filled 2013-03-19: qty 10

## 2013-03-19 MED ORDER — 0.9 % SODIUM CHLORIDE (POUR BTL) OPTIME
TOPICAL | Status: DC | PRN
  Administered 2013-03-19: 1000 mL

## 2013-03-19 MED ORDER — THROMBIN 20000 UNITS EX SOLR
CUTANEOUS | Status: DC | PRN
  Administered 2013-03-19: 15:00:00 via TOPICAL

## 2013-03-19 MED ORDER — OXYCODONE HCL 5 MG PO TABS: 5.0000 mg | ORAL_TABLET | ORAL | Status: DC | PRN

## 2013-03-19 MED ORDER — OXYCODONE HCL 5 MG/5ML PO SOLN: 5.0000 mg | Freq: Once | ORAL | Status: AC | PRN

## 2013-03-19 MED ORDER — LIDOCAINE HCL (CARDIAC) 20 MG/ML IV SOLN
INTRAVENOUS | Status: DC | PRN
  Administered 2013-03-19: 100 mg via INTRAVENOUS

## 2013-03-19 MED ORDER — SODIUM CHLORIDE 0.9 % IV SOLN
INTRAVENOUS | Status: DC
  Administered 2013-03-19: 10:00:00 via INTRAVENOUS

## 2013-03-19 MED ORDER — SODIUM CHLORIDE 0.9 % IR SOLN
Status: DC | PRN
  Administered 2013-03-19: 14:00:00

## 2013-03-19 MED ORDER — PHENYLEPHRINE HCL 10 MG/ML IJ SOLN
INTRAMUSCULAR | Status: DC | PRN
  Administered 2013-03-19: 40 ug via INTRAVENOUS
  Administered 2013-03-19 (×3): 80 ug via INTRAVENOUS
  Administered 2013-03-19: 40 ug via INTRAVENOUS

## 2013-03-19 MED ORDER — PROPOFOL 10 MG/ML IV BOLUS
INTRAVENOUS | Status: DC | PRN
  Administered 2013-03-19: 150 mg via INTRAVENOUS

## 2013-03-19 MED ORDER — ONDANSETRON HCL 4 MG/2ML IJ SOLN
INTRAMUSCULAR | Status: DC | PRN
  Administered 2013-03-19 (×2): 4 mg via INTRAVENOUS

## 2013-03-19 MED ORDER — OXYCODONE HCL 5 MG PO TABS
5.0000 mg | ORAL_TABLET | Freq: Once | ORAL | Status: AC | PRN
  Administered 2013-03-19: 5 mg via ORAL

## 2013-03-19 MED ORDER — OXYCODONE HCL 5 MG PO TABS
ORAL_TABLET | ORAL | Status: AC
  Filled 2013-03-19: qty 1

## 2013-03-19 MED ORDER — PROMETHAZINE HCL 25 MG/ML IJ SOLN
6.2500 mg | INTRAMUSCULAR | Status: DC | PRN
  Administered 2013-03-19: 6.25 mg via INTRAVENOUS

## 2013-03-19 MED ORDER — HYDROMORPHONE HCL PF 1 MG/ML IJ SOLN: 0.2500 mg | INTRAMUSCULAR | Status: DC | PRN

## 2013-03-19 MED ORDER — FENTANYL CITRATE 0.05 MG/ML IJ SOLN
INTRAMUSCULAR | Status: DC | PRN
  Administered 2013-03-19: 100 ug via INTRAVENOUS
  Administered 2013-03-19: 50 ug via INTRAVENOUS

## 2013-03-19 MED ORDER — ROCURONIUM BROMIDE 100 MG/10ML IV SOLN
INTRAVENOUS | Status: DC | PRN
  Administered 2013-03-19: 30 mg via INTRAVENOUS

## 2013-03-19 MED ORDER — PROTAMINE SULFATE 10 MG/ML IV SOLN
INTRAVENOUS | Status: DC | PRN
  Administered 2013-03-19: 10 mg via INTRAVENOUS
  Administered 2013-03-19: 20 mg via INTRAVENOUS

## 2013-03-19 MED ORDER — PROMETHAZINE HCL 25 MG/ML IJ SOLN
INTRAMUSCULAR | Status: AC
  Filled 2013-03-19: qty 1

## 2013-03-19 MED ORDER — GLYCOPYRROLATE 0.2 MG/ML IJ SOLN
INTRAMUSCULAR | Status: DC | PRN
  Administered 2013-03-19: .8 mg via INTRAVENOUS

## 2013-03-19 MED ORDER — HEPARIN SODIUM (PORCINE) 1000 UNIT/ML IJ SOLN
INTRAMUSCULAR | Status: DC | PRN
  Administered 2013-03-19: 6000 [IU] via INTRAVENOUS

## 2013-03-19 MED ORDER — THROMBIN 20000 UNITS EX SOLR
CUTANEOUS | Status: AC
  Filled 2013-03-19: qty 20000

## 2013-03-19 SURGICAL SUPPLY — 81 items
BAG DECANTER FOR FLEXI CONT (MISCELLANEOUS) ×3 IMPLANT
BANDAGE ELASTIC 4 VELCRO ST LF (GAUZE/BANDAGES/DRESSINGS) ×3 IMPLANT
BANDAGE ESMARK 6X9 LF (GAUZE/BANDAGES/DRESSINGS) ×2 IMPLANT
BANDAGE GAUZE ELAST BULKY 4 IN (GAUZE/BANDAGES/DRESSINGS) ×3 IMPLANT
BNDG ESMARK 6X9 LF (GAUZE/BANDAGES/DRESSINGS) ×3
CANISTER SUCTION 2500CC (MISCELLANEOUS) ×3 IMPLANT
CATH CANNON HEMO 15F 50CM (CATHETERS) IMPLANT
CATH CANNON HEMO 15FR 19 (HEMODIALYSIS SUPPLIES) IMPLANT
CATH CANNON HEMO 15FR 23CM (HEMODIALYSIS SUPPLIES) ×3 IMPLANT
CATH CANNON HEMO 15FR 31CM (HEMODIALYSIS SUPPLIES) IMPLANT
CATH CANNON HEMO 15FR 32CM (HEMODIALYSIS SUPPLIES) IMPLANT
CATH STRAIGHT 5FR 65CM (CATHETERS) IMPLANT
CLIP TI MEDIUM 6 (CLIP) ×3 IMPLANT
CLIP TI WIDE RED SMALL 6 (CLIP) ×3 IMPLANT
CLOTH BEACON ORANGE TIMEOUT ST (SAFETY) ×3 IMPLANT
CONT SPECI 4OZ STER CLIK (MISCELLANEOUS) ×3 IMPLANT
COVER PROBE W GEL 5X96 (DRAPES) ×3 IMPLANT
COVER SURGICAL LIGHT HANDLE (MISCELLANEOUS) ×3 IMPLANT
DECANTER SPIKE VIAL GLASS SM (MISCELLANEOUS) IMPLANT
DERMABOND ADVANCED (GAUZE/BANDAGES/DRESSINGS) ×2
DERMABOND ADVANCED .7 DNX12 (GAUZE/BANDAGES/DRESSINGS) ×4 IMPLANT
DRAIN PENROSE 1/2X12 LTX STRL (WOUND CARE) IMPLANT
DRAPE C-ARM 42X72 X-RAY (DRAPES) ×3 IMPLANT
DRAPE CHEST BREAST 15X10 FENES (DRAPES) ×3 IMPLANT
ELECT REM PT RETURN 9FT ADLT (ELECTROSURGICAL) ×3
ELECTRODE REM PT RTRN 9FT ADLT (ELECTROSURGICAL) ×2 IMPLANT
GAUZE SPONGE 2X2 8PLY STRL LF (GAUZE/BANDAGES/DRESSINGS) ×2 IMPLANT
GAUZE SPONGE 4X4 16PLY XRAY LF (GAUZE/BANDAGES/DRESSINGS) IMPLANT
GLOVE BIO SURGEON STRL SZ7 (GLOVE) ×3 IMPLANT
GLOVE BIOGEL PI IND STRL 6.5 (GLOVE) ×6 IMPLANT
GLOVE BIOGEL PI IND STRL 7.0 (GLOVE) ×6 IMPLANT
GLOVE BIOGEL PI IND STRL 7.5 (GLOVE) ×2 IMPLANT
GLOVE BIOGEL PI INDICATOR 6.5 (GLOVE) ×3
GLOVE BIOGEL PI INDICATOR 7.0 (GLOVE) ×3
GLOVE BIOGEL PI INDICATOR 7.5 (GLOVE) ×1
GLOVE ECLIPSE 6.5 STRL STRAW (GLOVE) ×9 IMPLANT
GLOVE SS BIOGEL STRL SZ 6.5 (GLOVE) ×2 IMPLANT
GLOVE SUPERSENSE BIOGEL SZ 6.5 (GLOVE) ×1
GLOVE SURG SS PI 7.0 STRL IVOR (GLOVE) ×3 IMPLANT
GLOVE SURG SS PI 7.5 STRL IVOR (GLOVE) ×3 IMPLANT
GOWN STRL NON-REIN LRG LVL3 (GOWN DISPOSABLE) ×21 IMPLANT
GOWN STRL REIN XL XLG (GOWN DISPOSABLE) ×3 IMPLANT
GRAFT GORETEX STND 6X20 (Vascular Products) ×3 IMPLANT
GRAFT GORETEXSTD 6X20 (Vascular Products) ×2 IMPLANT
KIT BASIN OR (CUSTOM PROCEDURE TRAY) ×3 IMPLANT
KIT ROOM TURNOVER OR (KITS) ×3 IMPLANT
NEEDLE 18GX1X1/2 (RX/OR ONLY) (NEEDLE) ×3 IMPLANT
NEEDLE HYPO 25GX1X1/2 BEV (NEEDLE) ×3 IMPLANT
NS IRRIG 1000ML POUR BTL (IV SOLUTION) ×3 IMPLANT
PACK CV ACCESS (CUSTOM PROCEDURE TRAY) ×3 IMPLANT
PACK SURGICAL SETUP 50X90 (CUSTOM PROCEDURE TRAY) IMPLANT
PAD ARMBOARD 7.5X6 YLW CONV (MISCELLANEOUS) ×6 IMPLANT
PAD CAST 4YDX4 CTTN HI CHSV (CAST SUPPLIES) ×2 IMPLANT
PADDING CAST COTTON 4X4 STRL (CAST SUPPLIES) ×1
SET MICROPUNCTURE 5F STIFF (MISCELLANEOUS) ×3 IMPLANT
SOAP 2 % CHG 4 OZ (WOUND CARE) ×3 IMPLANT
SPONGE GAUZE 2X2 STER 10/PKG (GAUZE/BANDAGES/DRESSINGS) ×1
SPONGE GAUZE 4X4 12PLY (GAUZE/BANDAGES/DRESSINGS) ×3 IMPLANT
SPONGE SURGIFOAM ABS GEL 100 (HEMOSTASIS) ×3 IMPLANT
STAPLER VISISTAT 35W (STAPLE) ×3 IMPLANT
SUT ETHILON 3 0 PS 1 (SUTURE) ×3 IMPLANT
SUT GORETEX 6.0 TT13 (SUTURE) ×9 IMPLANT
SUT MNCRL AB 4-0 PS2 18 (SUTURE) ×3 IMPLANT
SUT PROLENE 6 0 BV (SUTURE) IMPLANT
SUT PROLENE 7 0 BV 1 (SUTURE) ×3 IMPLANT
SUT VIC AB 2-0 CT1 27 (SUTURE) ×2
SUT VIC AB 2-0 CT1 TAPERPNT 27 (SUTURE) ×4 IMPLANT
SUT VIC AB 3-0 SH 27 (SUTURE) ×2
SUT VIC AB 3-0 SH 27X BRD (SUTURE) ×4 IMPLANT
SYR 20CC LL (SYRINGE) ×6 IMPLANT
SYR 30ML LL (SYRINGE) IMPLANT
SYR 3ML LL SCALE MARK (SYRINGE) ×3 IMPLANT
SYR 5ML LL (SYRINGE) ×3 IMPLANT
SYR CONTROL 10ML LL (SYRINGE) IMPLANT
SYRINGE 10CC LL (SYRINGE) ×3 IMPLANT
TAPE CLOTH SURG 4X10 WHT LF (GAUZE/BANDAGES/DRESSINGS) ×3 IMPLANT
TOWEL OR 17X24 6PK STRL BLUE (TOWEL DISPOSABLE) ×3 IMPLANT
TOWEL OR 17X26 10 PK STRL BLUE (TOWEL DISPOSABLE) ×3 IMPLANT
UNDERPAD 30X30 INCONTINENT (UNDERPADS AND DIAPERS) ×3 IMPLANT
WATER STERILE IRR 1000ML POUR (IV SOLUTION) ×3 IMPLANT
WIRE AMPLATZ SS-J .035X180CM (WIRE) IMPLANT

## 2013-03-19 NOTE — H&P (View-Only) (Signed)
[facility] [place]  Established Dialysis Access  History of Present Illness  [name] is a 52 y.o. 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  The patient denies prior bleeding complications.  She notes flow rates have been okay.  The patient has had no steal sx.  She denies any sequalae from her prior valvular insufficiency.  She does not ambulate due prior amputations.  Past Medical History  Diagnosis Date  . Hypertension   . Anemia   . Systolic CHF, acute on chronic   . Coronary artery disease   . Peripheral vascular disease   . CHF (congestive heart failure)   . Angina   . Blood transfusion ~2003; 08/12/2012    "when I started on dialysis; lower gi bleeding" (08/12/2012)  . Hypercholesteremia   . DVT of leg (deep venous thrombosis) 2011    "RLE" (08/12/2012)  . Shortness of breath 01/02/12    "@rest ; lying down; w/exertion"; no change 08/12/2012  . Type II diabetes mellitus   . Acute lower GI bleeding 08/12/2012  . ESRD on dialysis     "[facility]us Mako, TTS hemodialysis    Past Surgical History  Procedure Laterality Date  . 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 Av fistula placement  ~ 2003    left upper arm  . Abdominal hysterectomy  1990's  . Arteriovenous graft placement  2011    left upper arm  . Vascular surgery    . Cardiac catheterization  ~ 2010  . Cesarean section  1988  . Esophagogastroduodenoscopy  08/12/2012    Procedure: ESOPHAGOGASTRODUODENOSCOPY (EGD);  Strey: Vertell Novak., MD;  Location: [facility] ENDOSCOPY;  Service: Endoscopy;  Laterality: N/A;  . Colonoscopy  08/13/2012    Procedure: COLONOSCOPY;  Egger: Vertell Novak., MD;  Location: [facility] ENDOSCOPY;  Service: Endoscopy;  Laterality: N/A;    History   Social History  . Marital Status: Married    Spouse Name: N/A    Number of Children:  N/A  . Years of Education: N/A   Occupational History  . Not on file.   Social History Main Topics  . Smoking status: Never Smoker   . Smokeless tobacco: Never Used  . Alcohol Use: No  . Drug Use: No  . Sexually Active: Yes   Other Topics Concern  . Not on file   Social History Narrative  . No narrative on file    Family History  Problem Relation Age of Onset  . Breast cancer Sister   . Breast cancer Sister   . Kidney failure Mother   . Diabetes Mother   . Cancer - Other Father      Current Outpatient Prescriptions on File Prior to Visit  Medication Sig Dispense Refill  . amLODipine (NORVASC) 10 MG tablet Take 10 mg by mouth at bedtime.       . cinacalcet (SENSIPAR) 60 MG tablet Take 60 mg by mouth daily.      . famotidine (PEPCID) 20 MG tablet Take 20 mg by mouth at bedtime. One at bedtime      . insulin aspart (NOVOLOG) 100 UNIT/ML injection Inject 1-5 Units into the skin 3 (three) times daily before meals. Sliding scale      . insulin glargine (LANTUS) 100 UNIT/ML injection Inject 16 Units into the skin at bedtime.  10 mL  0  . calcium acetate (PHOSLO) 667 MG capsule Take 1,334 mg by mouth 3 (three) times daily with meals.      Marland Kitchen levofloxacin (LEVAQUIN) 500 MG tablet Take 1 tablet (500 mg total) by mouth every other day.  3 tablet  0  . losartan (COZAAR) 50 MG tablet Take 50 mg by mouth at bedtime.      . ondansetron (ZOFRAN ODT) 4 MG disintegrating tablet Take 1 tablet (4 mg total) by mouth every 8 (eight) hours as needed for nausea.  20 tablet  0   No current facility-administered medications on file prior to visit.    No Known Allergies  REVIEW OF SYSTEMS:  (Positives checked otherwise negative)  CARDIOVASCULAR:  []  chest pain, []  chest pressure, []  palpitations, []  shortness of breath when laying flat, []  shortness of breath with exertion,  []  pain in feet when walking, []  pain in feet when laying flat, []  history of blood clot in veins (DVT), []  history of  phlebitis, []  swelling in legs, []  varicose veins  PULMONARY:  []  productive cough, []  asthma, []  wheezing  NEUROLOGIC:  []  weakness in arms or legs, []  numbness in arms or legs, []  difficulty speaking or slurred speech, []  temporary loss of vision in one eye, []  dizziness  HEMATOLOGIC:  []  bleeding problems, []  problems with blood clotting too easily  MUSCULOSKEL:  []  joint pain, []  joint swelling, [x]  R AKA, L BKA  GASTROINTEST:  []  vomiting blood, []  blood in stool     GENITOURINARY:  []  burning with urination, []  blood in urine  PSYCHIATRIC:  []  history of major depression  INTEGUMENTARY:  []  rashes, []  ulcers  Physical Examination  Filed Vitals:   03/14/13 1109  BP: 179/89  Pulse: 99  Height: 5\' 2"  (1.575 m)  Weight: 154 lb (69.854 kg)  SpO2: 99%   Body mass index is 28.16 kg/(m^2).  General: A&O x 3, WD, WN  Pulmonary: Sym exp, good air movt, CTAB, no rales, rhonchi, & wheezing  Cardiac: RRR, Nl S1, S2, no rubs or gallops, HSM IV/VI  Gastrointestinal: soft, NTND, -G/R, - HSM, - masses, - CVAT B  Musculoskeletal: M/S 5/5 throughout , Extremities without  ischemic changes except  R AKA an L BKA, palpable thrill in access , multiple large pseudoaneurysms, lateral upper PSA with compromised skin  Neurologic: Pain and light touch intact in extremities , Motor exam as listed above   Medical Decision Making  [name] is a 52 y.o. 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   I have some concern of rupture with the more proximal PSA, so I recommend plication of both sets of the pseudoaneurysm and interval placement of a tunneled dialysis catheter   I had an extensive discussion with this patient in regards to the nature of access surgery, including risk, benefits, and alternatives.    The patient is aware that the risks of access surgery include but are not limited to: bleeding, infection, steal syndrome, nerve damage,  ischemic monomelic neuropathy, failure of access to mature, and possible need for additional access procedures in the future. The patient is aware the risks of tunneled dialysis catheter placement include but are not limited to: bleeding, infection, central venous injury, pneumothorax, possible venous stenosis, possible malpositioning in the venous system, and possible infections related to long-term catheter presence.   The patient has agreed to proceed with the above procedure which will be scheduled 16 JUL 14.  Leonides Sake, MD [facility] [place] Office: [phone] Pager: [phone]  03/14/2013, 12:04 PM

## 2013-03-19 NOTE — Anesthesia Preprocedure Evaluation (Addendum)
Anesthesia Evaluation  Patient identified by MRN, date of birth, ID band Patient awake    History of Anesthesia Complications Negative for: history of anesthetic complications  Airway Mallampati: II TM Distance: >3 FB Neck ROM: Full    Dental  (+) Edentulous Upper, Dental Advisory Given and Edentulous Lower   Pulmonary shortness of breath, pneumonia -, resolved, COPD   Pulmonary exam normal       Cardiovascular hypertension, Pt. on medications + angina + CAD, + Peripheral Vascular Disease and +CHF Rhythm:Regular Rate:Normal + Systolic murmurs- Diastolic murmurs Echo 10/10/2011: Left ventricle: The cavity size was normal. There was mild  concentric hypertrophy. Systolic function was mildly reduced. The estimated ejection fraction was in the range of 45% to 50%. Diffuse hypokinesis. - Aortic valve: Calcification. Moderate focal calcification   involving the left coronary cusp. - Mitral valve: Moderate regurgitation directed posteriorly. - Left atrium: The atrium was mildly dilated. - Right ventricle: Systolic function was mildly reduced. - Tricuspid valve: Moderate regurgitation.    Neuro/Psych negative neurological ROS  negative psych ROS   GI/Hepatic negative GI ROS, Neg liver ROS,   Endo/Other  diabetes  Renal/GU ESRF and DialysisRenal disease     Musculoskeletal negative musculoskeletal ROS (+)   Abdominal   Peds  Hematology negative hematology ROS (+)   Anesthesia Other Findings   Reproductive/Obstetrics                        Anesthesia Physical Anesthesia Plan  ASA: III  Anesthesia Plan: General   Post-op Pain Management:    Induction: Intravenous  Airway Management Planned: LMA  Additional Equipment:   Intra-op Plan:   Post-operative Plan: Extubation in OR  Informed Consent: I have reviewed the patients History and Physical, chart, labs and discussed the procedure including  the risks, benefits and alternatives for the proposed anesthesia with the patient or authorized representative who has indicated his/her understanding and acceptance.   Dental advisory given and Dental Advisory Given  Plan Discussed with: CRNA, Anesthesiologist and [place]  Anesthesia Plan Comments:        Anesthesia Quick Evaluation

## 2013-03-19 NOTE — Transfer of Care (Signed)
Immediate Anesthesia Transfer of Care Note  Patient: [name]  Procedure(s) Performed: Procedure(s) with comments: EXCISION OF LEFT BRACHIOCEPHALIC ARTERIOVENOUS FISTULA PSEUDOANEURYSM (Left) INSERTION OF DIALYSIS CATHETER (N/A) - Right Internal Jugular Placement REVISION OF ARTERIOVENOUS GORETEX GRAFT (Left) - using 6mm x 20cm Gore-Tex Graft  Patient Location: PACU  Anesthesia Type:General  Level of Consciousness: sedated  Airway & Oxygen Therapy: Patient Spontanous Breathing and Patient connected to nasal cannula oxygen  Post-op Assessment: Report given to PACU RN and Post -op Vital signs reviewed and stable  Post vital signs: Reviewed and stable  Complications: No apparent anesthesia complications

## 2013-03-19 NOTE — Interval H&P Note (Signed)
[facility] [place]  History and Physical Update  The patient was interviewed and re-examined.  The patient's previous History and Physical has been reviewed and is unchanged.  There is no change in the plan of care: Plication of L BC AVF, TDC placement.  Leonides Sake, MD [facility] [place] Office: [phone] Pager: [phone]  03/19/2013, 11:45 AM

## 2013-03-19 NOTE — Op Note (Signed)
OPERATIVE NOTE  PROCEDURE: 1.  Right internal jugular vein tunneled dialysis catheter placement 2.  Right internal jugular vein cannulation under ultrasound guidance 3.  Excision of ruptured graft segment 4.  Revision of left hybrid brachiocephalic arteriovenous fistula and upper arm graft with interposition graft 5.  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  Tips of the catheter in the right atrium on fluoroscopy 2.  No obvious pneumothorax on fluoroscopy  SPECIMEN(S):  Anaerobic and aerobic cultures of graft  INDICATIONS:   [name] is a 52 y.o. female who presents with end stage renal disease and concerning left brachiocephalic arteriovenous fistula pseudoaneurysm concerning for imminent rupture.  The patient presents for tunneled dialysis catheter placement.  The patient is aware the risks of tunneled dialysis catheter placement include but are not limited to: bleeding, infection, central venous injury, pneumothorax, possible venous stenosis, possible malpositioning in the venous system, and possible infections related to long-term catheter presence.  The patient was aware of these risks and agreed to proceed.  DESCRIPTION: After written full informed consent was obtained from the patient, the patient was taken back to the operating room.  Prior to induction, the patient was given IV antibiotics.  After obtaining adequate sedation, the patient was prepped and draped in the standard fashion for a chest or neck tunneled dialysis catheter placement.  Under ultrasound guidance, the right internal jugular vein was cannulated with the 18 gauge  needle.  A J-wire was then placed down in the right ventricle under fluroscopic guidance.  The wire was then secured in place with a clamp to the drapes.  I then made stab incisions at the neck and exit sites.   I dissected from the exit site to the cannulation site with a metal tunneler.   The subcutaneous tunnel was dilated by passing a plastic dilator over the metal dissector. The wire was then unclamped and I removed the needle.  The skin tract and venotomy was dilated serially with dilators.  Finally, the dilator-sheath was placed under fluroscopic guidance into the superior vena cava.  The dilator and wire were removed.  A 23 cm Diatek catheter was placed under fluoroscopic guidance down into the right atrium.  The sheath was broken and peeled away while holding the catheter cuff at the level of the skin.  The back end of this catheter was transected, revealing the two lumens of this catheter.  The ports were docked onto these two lumens.  The catheter hub was then screwed into place.  Each port was tested by aspirating and flushing.  No resistance was noted.  Each port was then thoroughly flushed with heparinized saline.  The catheter was secured in placed with two interrupted stitches of 3-0 Nylon tied to the catheter.  The neck incision was closed with a U-stitch of 4-0 Monocryl.  The neck and chest incision were cleaned and sterile bandages applied.  Each port was then loaded with concentrated heparin (1000 Units/mL) at the manufacturer recommended volumes to each port.  Sterile caps were applied to each port.  On completion fluoroscopy, the tips of the catheter were in the right atrium, and there was no evidence of pneumothorax.  At this points, the drapes were taken down.  The  operating room bed was repositioned for a left arm access procedure.  The patient was prepped and draped in the standard fashion for: left access procedure.  The patient was given 6000 units of Heparin intravenously, which was a  therapeutic bolus.  After waiting 5 minutes, a sterile tourniquet was applied to the upper arm and inflated to 250 mm Hg after exsanguinating the arm with an Esmark bandage.  An elliptical incision was made around the skin overlying the two pseudoaneurysmal segments in the fistula.  I dissected the skin away from the pseudoaneurysm at both sites  with electrocautery.  Eventually, I was able to skeletonize the pseudoaneurysm at each site.  Upon entering each pseudoaneurysm, it became evident this pseudoaneurysm was actually in the an interposition graft segment.  In reviewing the chart, this brachiocephalic arteriovenous fistula has previously been revised with an interposition graft.  I extended the incisions proximally and distally and dissected out the graft proximally and distally until I had enough length to clamp this graft.   I clamped the proximal and distal segment of the graft and dropped the tourniquet.  I connected the two incisions sharply and dissected out the graft with electrocautery between the two clamped segments.  I transected the graft just distal and proximal to both clamps.  I passed off the graft as a specimen for anaerobic and aerobic cultures.  I obtained a 6 mm segment of Goretex graft.  I tunneled laterally in a curvilinear fashion from one end of the graft to the other with a Gore tunneler.  I passed the new graft through the metal tunnel and then removed the tunnel.  I spatulated the new graft to meet the old graft diameter.  The interposition graft was sewed to the old graft with a running stitch of CV-6.  I pulled the graft to tension in the tunnel.  I spatulated this proximal end of the graft to meet the diameter of the old graft.  This end of the interposition graft was sewed to the old graft with a running stitch of CV-6.  Prior to completing this anastomosis, I backbled both ends of the graft.  There was good bleeding from both ends.  Note, I passed a 5 mm dilator proximally  without any resistance.  I completed the anastomosis in the usual fashion.   The distal chronically thrombosed brachiocephalic arteriovenous fistula pseudoaneurysm was in close proximity to the incision line, so I felt excision of the pseudoaneurysm could be completed with minimal alteration in the incision line.  I made an incision over the old distal brachiocephalic arteriovenous fistula pseudoaneurysm in a fashion to complete an ellipse with the current incision line.  I dissected out the chronically thrombosed brachiocephalic arteriovenous fistula with electrocautery.  There was minimal bleeding from the proximal vein, so I tied off the cephalic vein with 2-0 silk and transected this  pseudoaneurysm.  Proximally the fistula was chronically occluded.  As a precaution, I tied off the distal fistula with a 2-0 silk.    At this point, the patient was given 30 mg of Protamine intravenously to reverse the anticoagulation.  Thrombin and gelfoam were applied to the incision.  Bleeding points were controlled with suture ligature and electrocautery.  After a few more round of thrombin and gelfoam, no further active bleeding was noted.  I reapproximated the deep subcutaneous tissue with a running stitch of 2-0 Vicryl, taking care to obliterate dead space in the process.  I reapproximated the superficial subcutaneous tissue in  the subdermal layer with a running stitch of 3-0 Vicryl.  The skin was reapproximated with staples.  The skin was cleaned, dried, and sterile guaze was applied to the incision.  The arm was wrapped with a Kerlix and a gentle ACE wrap.  The patient continued to have a palpable thrill in the graft.   COMPLICATIONS: none  CONDITION: stable  Leonides Sake, MD [facility] [place] Office: [phone] Pager: [phone]  03/19/2013, 1:17 PM

## 2013-03-19 NOTE — Preoperative (Signed)
Beta Blockers   Reason not to administer Beta Blockers:Not Applicable 

## 2013-03-19 NOTE — Anesthesia Procedure Notes (Signed)
Procedure Name: Intubation Date/Time: 03/19/2013 12:46 PM Performed by: Sherie Don Pre-anesthesia Checklist: Patient identified, Emergency Drugs available, Suction available, Patient being monitored and Timeout performed Patient Re-evaluated:Patient Re-evaluated prior to inductionOxygen Delivery Method: Circle system utilized Preoxygenation: Pre-oxygenation with 100% oxygen Intubation Type: Rapid sequence and IV induction Laryngoscope Size: Mac and 3 Grade View: Grade I Tube type: Oral Tube size: 7.5 mm Number of attempts: 1 Airway Equipment and Method: Stylet Placement Confirmation: ETT inserted through vocal cords under direct vision,  positive ETCO2 and breath sounds checked- equal and bilateral Secured at: 23 cm Tube secured with: Tape Dental Injury: Teeth and Oropharynx as per pre-operative assessment

## 2013-03-19 NOTE — OR Nursing (Signed)
Dialysis Catheter Placement end @1320 

## 2013-03-19 NOTE — Anesthesia Postprocedure Evaluation (Signed)
Anesthesia Post Note  Patient: [name]  Procedure(s) Performed: Procedure(s) (LRB): EXCISION OF LEFT BRACHIOCEPHALIC ARTERIOVENOUS FISTULA PSEUDOANEURYSM (Left) INSERTION OF DIALYSIS CATHETER (N/A) REVISION OF ARTERIOVENOUS GORETEX GRAFT (Left)  Anesthesia type: general  Patient location: PACU  Post pain: Pain level controlled  Post assessment: Patient's Cardiovascular Status Stable  Last Vitals:  Filed Vitals:   03/19/13 1607  BP: 167/85  Pulse: 89  Temp:   Resp: 16    Post vital signs: Reviewed and stable  Level of consciousness: sedated  Complications: No apparent anesthesia complications

## 2013-03-19 NOTE — Telephone Encounter (Addendum)
Message copied by Rosalyn Charters on Wed Mar 19, 2013  4:41 PM ------      Message from: Melene Plan      Created: Wed Mar 19, 2013  4:11 PM                   ----- Message -----         From: Fransisco Hertz, MD         Sent: 03/19/2013   4:08 PM           To: Reuel Derby, Melene Plan, RN            Keyona Emrich Blahnik      [id number]      13-Mar-1961            PROCEDURE:      1.  Right internal jugular vein tunneled dialysis catheter placement      2.  Right internal jugular vein cannulation under ultrasound guidance      3.  Excision of ruptured graft segment      4.  Revision of left hybrid brachiocephalic arteriovenous fistula and upper arm graft with interposition graft      5.  Excision of thrombosed left brachiocephalic arteriovenous fistula  Pseudoaneurysm            Asst: Della Goo, [facility]            Follow-up: 2 week for staple removal             ------notified patient of fu appt. with dr. Imogene Burn on 04-04-13 at 11:45

## 2013-03-21 LAB — WOUND CULTURE
Culture: NO GROWTH
Gram Stain: NONE SEEN

## 2013-03-24 ENCOUNTER — Encounter (HOSPITAL_COMMUNITY): Payer: Self-pay | Admitting: Vascular Surgery

## 2013-04-03 ENCOUNTER — Encounter: Payer: Self-pay | Admitting: Vascular Surgery

## 2013-04-04 ENCOUNTER — Encounter: Payer: Self-pay | Admitting: Vascular Surgery

## 2013-04-04 ENCOUNTER — Ambulatory Visit (INDEPENDENT_AMBULATORY_CARE_PROVIDER_SITE_OTHER): Payer: Medicare Other | Admitting: Vascular Surgery

## 2013-04-04 VITALS — BP 173/96 | HR 96 | Temp 98.3°F | Resp 16 | Ht 62.5 in | Wt 156.0 lb

## 2013-04-04 DIAGNOSIS — Z48812 Encounter for surgical aftercare following surgery on the circulatory system: Secondary | ICD-10-CM

## 2013-04-04 DIAGNOSIS — N186 End stage renal disease: Secondary | ICD-10-CM

## 2013-04-04 DIAGNOSIS — Z4802 Encounter for removal of sutures: Secondary | ICD-10-CM | POA: Insufficient documentation

## 2013-04-04 NOTE — Progress Notes (Signed)
[facility] [place]  Postoperative Access Visit  History of Present Illness  [name] is a 52 y.o. year old female who presents for postoperative follow-up for procedures performed on 03/19/13:  1. Right internal jugular vein tunneled dialysis catheter placement  2. Right internal jugular vein cannulation under ultrasound guidance  3. Excision of ruptured graft segment  4. Revision of left hybrid brachiocephalic arteriovenous fistula and upper arm graft with interposition graft  5. Excision of thrombosed left brachiocephalic arteriovenous fistula pseudoaneurysm The patient's wounds are healed.  The patient notes no steal symptoms.  The patient is able to complete their activities of daily living.  The patient's current symptoms are: none.  For VQI Use Only  PRE-ADM LIVING: Home  AMB STATUS: Ambulatory  Physical Examination Filed Vitals:   04/04/13 1206  BP: 173/96  Pulse: 96  Temp: 98.3 F (36.8 C)  Resp: 16    LUE: Incision is healed, staples in place, skin feels warm, hand grip is 5/5, sensation in digits is intact, palpable thrill, bruit can be auscultated   Medical Decision Making  [name] is a 52 y.o. year old female who presents s/p excision of L BC AVF PSA, excision of L hybrid AVF/AVG PSA, revision with interposition graft.  The patient's access will be ready for use in 2 weeks.  The patient's tunneled dialysis catheter can be removed after two successful cannulations and completed dialysis treatments.  Thank you for allowing [place] to participate in this patient's care.  Leonides Sake, MD [facility] [place] Office: [phone] Pager: [phone]  04/04/2013, 1:56 PM

## 2013-07-29 ENCOUNTER — Other Ambulatory Visit: Payer: Self-pay | Admitting: Internal Medicine

## 2013-12-04 IMAGING — CR DG CHEST 1V PORT
1 series · 1 of 1 positions shown · non-contrast
Comparison: 01/03/2012.

CLINICAL DATA: Short of breath.  Difficulty breathing.

PORTABLE CHEST - 1 VIEW

[AP]
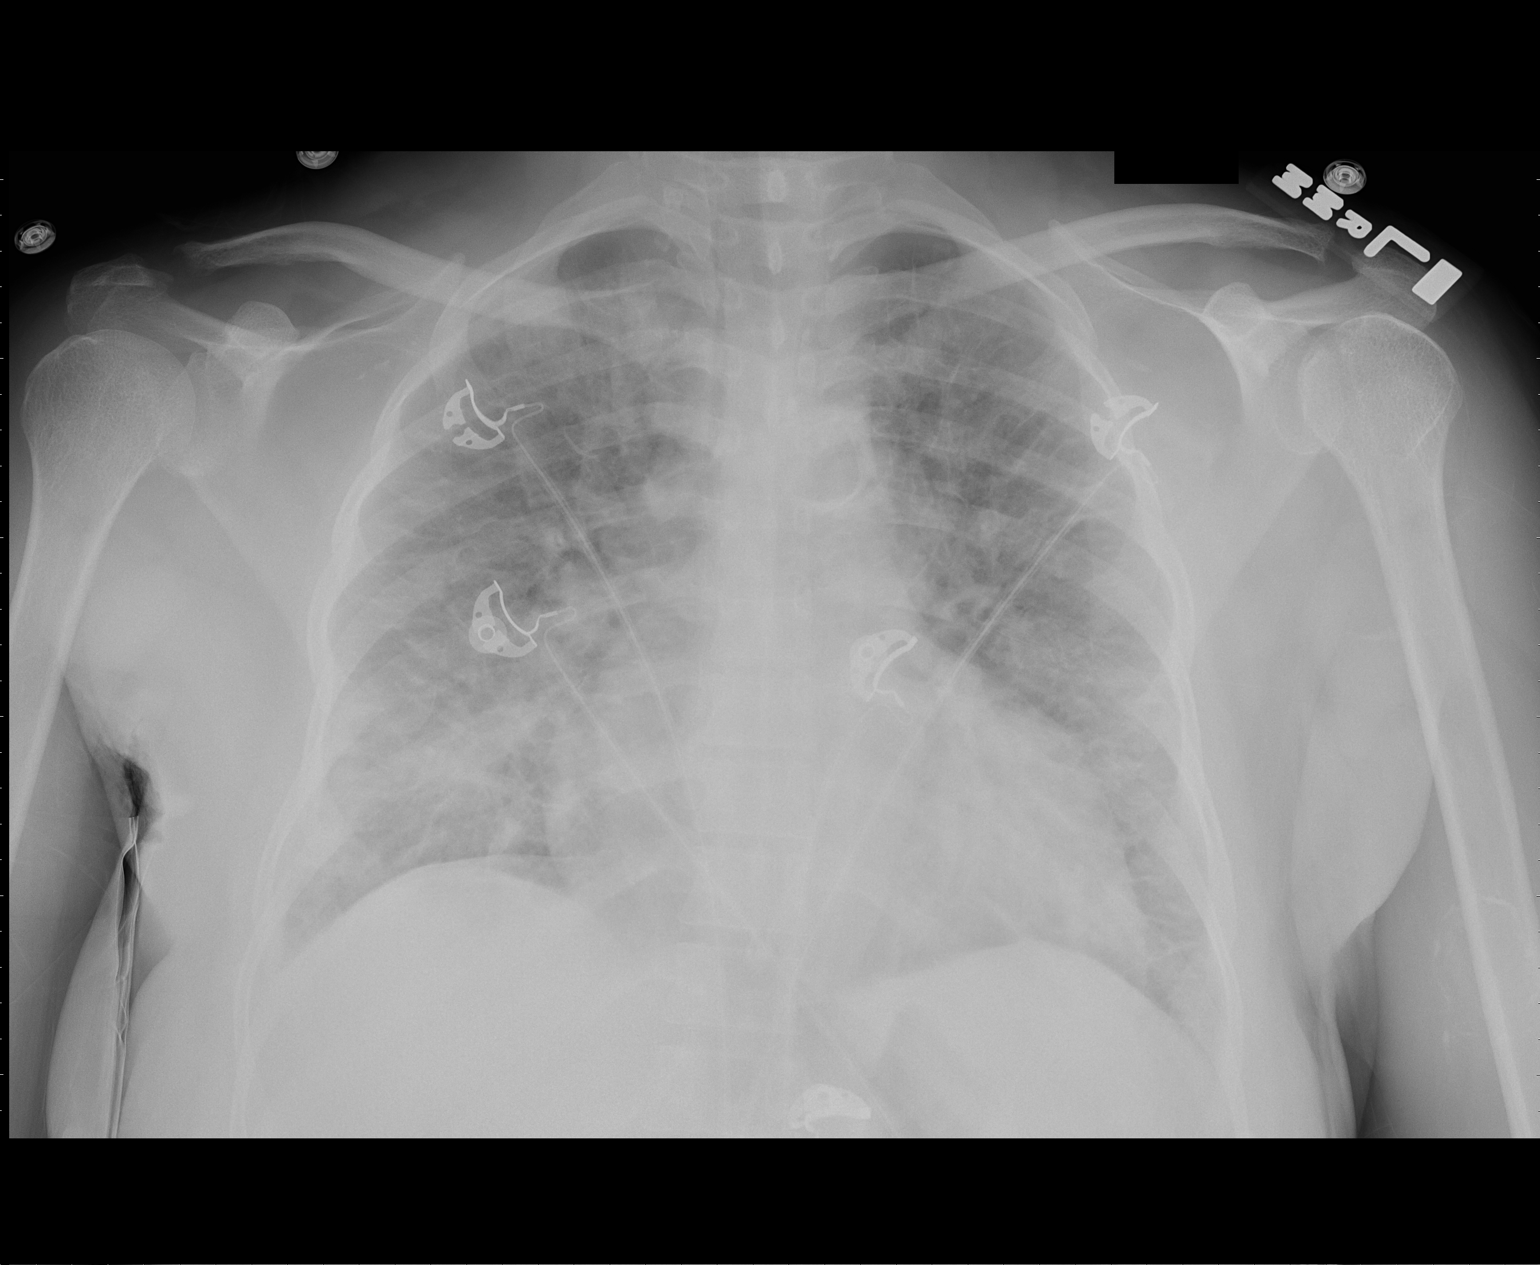

[1 of 1 positions shown; findings below may reference images not displayed]

FINDINGS: Cardiomegaly remains present, more pronounced than on
prior exam most similar to the prior study of 10/09/2011.
Interstitial and alveolar pulmonary edema is present with Kerley B
lines in the periphery.  Aortic arch atherosclerosis.
IMPRESSION: Moderate CHF.

## 2013-12-12 ENCOUNTER — Other Ambulatory Visit: Payer: Self-pay | Admitting: Internal Medicine

## 2014-05-28 ENCOUNTER — Encounter: Payer: Self-pay | Admitting: Vascular Surgery

## 2014-05-29 ENCOUNTER — Encounter: Payer: Self-pay | Admitting: Family

## 2014-05-29 ENCOUNTER — Ambulatory Visit (INDEPENDENT_AMBULATORY_CARE_PROVIDER_SITE_OTHER): Payer: Medicare Other | Admitting: Family

## 2014-05-29 VITALS — BP 186/100 | HR 108 | Resp 18 | Ht 61.5 in | Wt 159.0 lb

## 2014-05-29 DIAGNOSIS — L988 Other specified disorders of the skin and subcutaneous tissue: Secondary | ICD-10-CM

## 2014-05-29 DIAGNOSIS — Z48812 Encounter for surgical aftercare following surgery on the circulatory system: Secondary | ICD-10-CM

## 2014-05-29 DIAGNOSIS — M79605 Pain in left leg: Secondary | ICD-10-CM

## 2014-05-29 DIAGNOSIS — M79609 Pain in unspecified limb: Secondary | ICD-10-CM

## 2014-05-29 DIAGNOSIS — I7025 Atherosclerosis of native arteries of other extremities with ulceration: Secondary | ICD-10-CM | POA: Insufficient documentation

## 2014-05-29 DIAGNOSIS — R234 Changes in skin texture: Secondary | ICD-10-CM | POA: Insufficient documentation

## 2014-05-29 DIAGNOSIS — L98499 Non-pressure chronic ulcer of skin of other sites with unspecified severity: Secondary | ICD-10-CM

## 2014-05-29 DIAGNOSIS — I739 Peripheral vascular disease, unspecified: Secondary | ICD-10-CM

## 2014-05-29 MED ORDER — OXYCODONE HCL 5 MG PO TABS: ORAL_TABLET | ORAL | Status: DC

## 2014-05-29 NOTE — Patient Instructions (Signed)

## 2014-05-29 NOTE — Progress Notes (Signed)
Established PAD  History of Present Illness  [name] is a 53 y.o. (11/12/1960) female patient;  on 03/19/13 she had the following performed by Dr Imogene Burn:  1. Right internal jugular vein tunneled dialysis catheter placement  2. Right internal jugular vein cannulation under ultrasound guidance  3. Excision of ruptured graft segment  4. Revision of left hybrid brachiocephalic arteriovenous fistula and upper arm graft with interposition graft  5. 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 She denies any problems with her left upper arm AVG.  She returns today referred by Dr. Jonni Sanger with C/O Bilat. stump pain Right > Left, 1 mo . Left stump ulcers, 1 mo. She has intermittent pain in both LE stumps that somehow seems connected, left stump pain is worse, and is 11 out of 10 severity, per patient. She has never smoked but is exposed to her husband's smoking in their house, her A1C in July, 2015 was 8.3., states she rarely checks her blood sugar. Ulcers started on her left stump about a month ago, getting worse; patient denies using prostheses on either stump, denies any constant type abrasive activity to either stump. Dr. 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   Past Medical History  Diagnosis Date  . Hypertension   . Anemia   . Systolic CHF, acute on chronic   . Coronary artery disease   . Peripheral vascular disease   . CHF (congestive heart failure)   . Angina   . Blood transfusion ~2003; 08/12/2012    "when I started on dialysis; lower gi bleeding" (08/12/2012)  . Hypercholesteremia   . DVT of leg (deep venous thrombosis) 2011    "RLE" (08/12/2012)  . Type II diabetes mellitus   . Acute lower GI bleeding 08/12/2012  . Heart murmur     some say yes , some say no  . Shortness of breath 01/02/12    " ; lying down; w/exertion"; no  change 08/12/2012  . COPD (chronic obstructive pulmonary disease)   . ESRD on dialysis     "[facility], TTS hemodialysis and Mon 4 times aweek  . Pneumonia     Dec 2013  . Sickle cell trait     Social History History  Substance Use Topics  . Smoking status: Never Smoker   . Smokeless tobacco: Never Used  . Alcohol Use: No    Family History Family History  Problem Relation Age of Onset  . Breast cancer Sister   . Breast cancer Sister   . Kidney failure Mother   . Diabetes Mother   . Cancer - Other Father     Surgical History Past Surgical History  Procedure Laterality Date  . 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 Av fistula placement  ~ 2003    left upper arm  . Abdominal hysterectomy  1990's  . Arteriovenous graft placement  2011    left upper arm  . Vascular surgery    . Cardiac catheterization  ~ 2010  . Cesarean section  1988  . Esophagogastroduodenoscopy  08/12/2012    Procedure: ESOPHAGOGASTRODUODENOSCOPY (EGD);  Gossard: Vertell Novak., MD;  Location: [facility] ENDOSCOPY;  Service: Endoscopy;  Laterality: N/A;  . Colonoscopy  08/13/2012    Procedure: COLONOSCOPY;  Rochel: Tresea Mall  Montez Hageman., MD;  Location: [facility] ENDOSCOPY;  Service: Endoscopy;  Laterality: N/A;  . Resection of arteriovenous fistula aneurysm Left 03/19/2013    Procedure: EXCISION OF LEFT BRACHIOCEPHALIC ARTERIOVENOUS FISTULA PSEUDOANEURYSM;  Vanderzee: Fransisco Hertz, MD;  Location: [facility] OR;  Service: Vascular;  Laterality: Left;  . Insertion of dialysis catheter N/A 03/19/2013    Procedure: INSERTION OF DIALYSIS CATHETER;  Rostro: Fransisco Hertz, MD;  Location: [facility] OR;  Service: Vascular;  Laterality: N/A;  Right Internal Jugular Placement  . Revision of arteriovenous goretex graft Left 03/19/2013    Procedure: REVISION OF ARTERIOVENOUS GORETEX GRAFT;  Strupp: Fransisco Hertz, MD;  Location: [facility] OR;  Service: Vascular;  Laterality: Left;  using 6mm x 20cm  Gore-Tex Graft    No Known Allergies  Current Outpatient Prescriptions  Medication Sig Dispense Refill  . amLODipine (NORVASC) 10 MG tablet Take 10 mg by mouth at bedtime.       Marland Kitchen BENICAR 40 MG tablet Take 1 tablet by mouth daily.      . famotidine (PEPCID) 20 MG tablet TAKE ONE TABLET BY MOUTH AT BEDTIME  30 tablet  0  . insulin glargine (LANTUS) 100 UNIT/ML injection Inject 16 Units into the skin at bedtime.  10 mL  0  . LYRICA 50 MG capsule daily.      Marland Kitchen RENVELA 800 MG tablet 2 with Meals and one with snacks      . cinacalcet (SENSIPAR) 60 MG tablet Take 60 mg by mouth daily.      . insulin aspart (NOVOLOG) 100 UNIT/ML injection Inject 1-5 Units into the skin 3 (three) times daily before meals. Sliding scale      . losartan (COZAAR) 50 MG tablet Take 50 mg by mouth at bedtime.      Marland Kitchen oxyCODONE (ROXICODONE) 5 MG immediate release tablet Take 1-2 tablets (5-10 mg total) by mouth every 4 (four) hours as needed for pain.  30 tablet  0  . Sucroferric Oxyhydroxide (VELPHORO) 500 MG CHEW Chew 1 tablet by mouth 3 (three) times daily with meals. Also she takes if she has a snack.       No current facility-administered medications for this visit.     REVIEW OF SYSTEMS: see HPI for pertinent positives and negatives    PHYSICAL EXAMINATION:  Filed Vitals:   05/29/14 1614  BP: 186/100  Pulse: 108  Resp: 18  Height: 5' 1.5" (1.562 m)  Weight: 159 lb (72.122 kg)  SpO2: 98%   Body mass index is 29.56 kg/(m^2).  General: The patient appears their stated age, seated in wheelchair.   HEENT:  No gross abnormalities Pulmonary: Respirations are non-labored Abdomen: Soft and non-tender. Musculoskeletal: There are no major deformities; right   Neurologic: No focal weakness or paresthesias are detected, Skin: There are no rashes noted. No ulcers on right LE stump, superficial non draining ulcers on left stump, no necrosis, no gangrene, no erythema, no swelling. Psychiatric: The patient has  normal affect. Cardiovascular: There is a regular rate and rhythm without significant murmur appreciated. Right femoral pulse is 2+ palpable, left femoral pulse is not palpable, left popliteal pulse is not palpable. Left upper arm AV graft has a palpable thrill.   Medical Decision Making  [name] is a 53 y.o. female who is s/p right AKA in 05/2010 and left BKA in 06/2010. She presents with painful, possible ischemic ulcers on her left LE stump, left femoral pulse is not palpable, right femoral pulse is palpable.  Patient complains of intermittent severe left LE stump pain, and also pain in her right stump. Prescribed oxycodone 5 mg, 1 tab every 8 hours as needed for severe pain, disp #14, no refills. This is in the background of uncontrolled diabetes, end stage renal failure, and chronic exposure to second hand tobacco smoke. After discussing with Dr. Imogene Burn, patient will be scheduled for aortoiliac Duplex to evaluate for possible left iliac artery stenosis on 06/04/14 and follow up with Dr. Darrick Penna that day.    [name], Carma Lair, RN, MSN, FNP-C [facility] [place] Office: [phone]  05/29/2014, 4:30 PM  Clinic MD: Imogene Burn

## 2014-06-02 ENCOUNTER — Ambulatory Visit (HOSPITAL_COMMUNITY)
Admission: RE | Admit: 2014-06-02 | Discharge: 2014-06-02 | Disposition: A | Discharge status: Home / Self Care | Payer: Medicare Other | Source: Ambulatory Visit | Attending: Vascular Surgery | Admitting: Vascular Surgery

## 2014-06-02 ENCOUNTER — Other Ambulatory Visit: Payer: Self-pay | Admitting: Family

## 2014-06-02 DIAGNOSIS — I739 Peripheral vascular disease, unspecified: Secondary | ICD-10-CM | POA: Insufficient documentation

## 2014-06-02 DIAGNOSIS — M79609 Pain in unspecified limb: Secondary | ICD-10-CM

## 2014-06-02 DIAGNOSIS — M79605 Pain in left leg: Secondary | ICD-10-CM

## 2014-06-02 DIAGNOSIS — Z48812 Encounter for surgical aftercare following surgery on the circulatory system: Secondary | ICD-10-CM | POA: Insufficient documentation

## 2014-06-02 DIAGNOSIS — L98499 Non-pressure chronic ulcer of skin of other sites with unspecified severity: Secondary | ICD-10-CM | POA: Insufficient documentation

## 2014-06-03 ENCOUNTER — Encounter: Payer: Self-pay | Admitting: Vascular Surgery

## 2014-06-04 ENCOUNTER — Ambulatory Visit (INDEPENDENT_AMBULATORY_CARE_PROVIDER_SITE_OTHER): Payer: Medicare Other | Admitting: Vascular Surgery

## 2014-06-04 ENCOUNTER — Encounter: Payer: Self-pay | Admitting: Vascular Surgery

## 2014-06-04 VITALS — BP 190/81 | HR 96 | Temp 97.6°F | Resp 16 | Ht 61.5 in | Wt 159.0 lb

## 2014-06-04 DIAGNOSIS — I739 Peripheral vascular disease, unspecified: Secondary | ICD-10-CM

## 2014-06-04 DIAGNOSIS — L98499 Non-pressure chronic ulcer of skin of other sites with unspecified severity: Secondary | ICD-10-CM

## 2014-06-04 NOTE — Progress Notes (Signed)
Patient is a 53-year-old female who has previously had a right above-knee amputation and a left below-knee amputation. She developed an ulceration on her left below-knee amputation and some pain approximately one month ago. She was recently seen by our nurse practitioner in her aortoiliac duplex ordered. She returns today for followup. He still has some pain in the ulceration. They have not progressed significantly. They are dry. Both of her amputations were performed approximately 4 years ago. She has not had problems with them since that time. She is chronically on hemodialysis and dialyzes Monday Tuesday Thursday and Saturday due to problems with fluid overload. She has not had any episodes of congestive failure recently. Chronic medical problems include diabetes, elevated cholesterol, hypertension, coronary artery disease, congestive failure. These are all currently stable.  Review of systems: She denies chest pain. She denies shortness of breath.  Past Medical History  Diagnosis Date  . Hypertension   . Anemia   . Systolic CHF, acute on chronic   . Coronary artery disease   . Peripheral vascular disease   . CHF (congestive heart failure)   . Angina   . Blood transfusion ~2003; 08/12/2012    "when I started on dialysis; lower gi bleeding" (08/12/2012)  . Hypercholesteremia   . DVT of leg (deep venous thrombosis) 2011    "RLE" (08/12/2012)  . Type II diabetes mellitus   . Acute lower GI bleeding 08/12/2012  . Heart murmur     some say yes , some say no  . Shortness of breath 01/02/12    "@rest; lying down; w/exertion"; no change 08/12/2012  . COPD (chronic obstructive pulmonary disease)   . ESRD on dialysis     "[facility], TTS hemodialysis and Mon 4 times aweek  . Pneumonia     Dec 2013  . Sickle cell trait      Past Surgical History  Procedure Laterality Date  . 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 Av fistula placement  ~ 2003     left upper arm  . Abdominal hysterectomy  1990's  . Arteriovenous graft placement  2011    left upper arm  . Vascular surgery    . Cardiac catheterization  ~ 2010  . Cesarean section  1988  . Esophagogastroduodenoscopy  08/12/2012    Procedure: ESOPHAGOGASTRODUODENOSCOPY (EGD);  Machorro: James L Edwards Jr., MD;  Location: [facility] ENDOSCOPY;  Service: Endoscopy;  Laterality: N/A;  . Colonoscopy  08/13/2012    Procedure: COLONOSCOPY;  Clegg: James L Edwards Jr., MD;  Location: [facility] ENDOSCOPY;  Service: Endoscopy;  Laterality: N/A;  . Resection of arteriovenous fistula aneurysm Left 03/19/2013    Procedure: EXCISION OF LEFT BRACHIOCEPHALIC ARTERIOVENOUS FISTULA PSEUDOANEURYSM;  Kates: Brian L Chen, MD;  Location: [facility] OR;  Service: Vascular;  Laterality: Left;  . Insertion of dialysis catheter N/A 03/19/2013    Procedure: INSERTION OF DIALYSIS CATHETER;  Rothe: Brian L Chen, MD;  Location: [facility] OR;  Service: Vascular;  Laterality: N/A;  Right Internal Jugular Placement  . Revision of arteriovenous goretex graft Left 03/19/2013    Procedure: REVISION OF ARTERIOVENOUS GORETEX GRAFT;  Teague: Brian L Chen, MD;  Location: [facility] OR;  Service: Vascular;  Laterality: Left;  using 6mm x 20cm Gore-Tex Graft   History   Social History  . Marital Status: Married    Spouse Name: N/A    Number of Children: N/A  . Years   of Education: N/A   Occupational History  . Not on file.   Social History Main Topics  . Smoking status: Never Smoker   . Smokeless tobacco: Never Used  . Alcohol Use: No  . Drug Use: No  . Sexual Activity: Yes   Other Topics Concern  . Not on file   Social History Narrative  . No narrative on file   Current Outpatient Prescriptions on File Prior to Visit  Medication Sig Dispense Refill  . amLODipine (NORVASC) 10 MG tablet Take 10 mg by mouth at bedtime.       Marland Kitchen. BENICAR 40 MG tablet Take 1 tablet by mouth daily.      . cinacalcet (SENSIPAR) 60 MG tablet Take 60 mg by mouth  daily.      . famotidine (PEPCID) 20 MG tablet TAKE ONE TABLET BY MOUTH AT BEDTIME  30 tablet  0  . insulin aspart (NOVOLOG) 100 UNIT/ML injection Inject 1-5 Units into the skin 3 (three) times daily before meals. Sliding scale      . insulin glargine (LANTUS) 100 UNIT/ML injection Inject 16 Units into the skin at bedtime.  10 mL  0  . losartan (COZAAR) 50 MG tablet Take 50 mg by mouth at bedtime.      Marland Kitchen. LYRICA 50 MG capsule daily.      Marland Kitchen. oxyCODONE (ROXICODONE) 5 MG immediate release tablet 1 tablet every 8 hours as needed for severe left stump pain  14 tablet  0  . RENVELA 800 MG tablet 2 with Meals and one with snacks      . Sucroferric Oxyhydroxide (VELPHORO) 500 MG CHEW Chew 1 tablet by mouth 3 (three) times daily with meals. Also she takes if she has a snack.       No current facility-administered medications on file prior to visit.     Physical exam:  Filed Vitals:   06/04/14 1108  BP: 190/81  Pulse: 96  Temp: 97.6 F (36.4 C)  TempSrc: Oral  Resp: 16  Height: 5' 1.5" (1.562 m)  Weight: 159 lb (72.122 kg)  SpO2: 96%    Extremities: Well healed right above-knee amputation 2+ femoral pulse, left below-knee amputation 2 areas of ulceration one approximately 3 cm diameter one approximately 2 cm diameter a 3 cm diameter one was in the anterior aspect of the distal aspect of the stump the 2 cm one is more posterior. These are both dry and less than 1 mm depth. She has a very difficult to palpate left femoral pulse.  Data: The patient had aortoiliac duplex performed recently. I reviewed the findings of this today. 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 Right iliac system was widely patent.  Assessment: Failing left below-knee amputation possibly secondary to decreased flow from aortoiliac system left side  Plan: Aortogram lower extremity runoff possible intervention scheduled for 06/19/2014. Risks benefits possible complications and procedure details were  trying to the patient and her daughter today. Understand agree to proceed. They also understand that the left below-knee amputation continues to be painful and has not healed despite increased inflow. She may need to consider a left above-knee amputation.  Fabienne Brunsharles Fields, MD [facility] [place] Office: [phone] Pager: [phone]

## 2014-06-05 ENCOUNTER — Other Ambulatory Visit: Payer: Self-pay

## 2014-06-16 ENCOUNTER — Encounter (HOSPITAL_COMMUNITY): Payer: Self-pay | Admitting: Emergency Medicine

## 2014-06-16 ENCOUNTER — Emergency Department (HOSPITAL_COMMUNITY)
Admission: EM | Admit: 2014-06-16 | Discharge: 2014-06-16 | Disposition: A | Discharge status: Home / Self Care | Payer: Medicare Other | Attending: Emergency Medicine | Admitting: Emergency Medicine

## 2014-06-16 DIAGNOSIS — Z862 Personal history of diseases of the blood and blood-forming organs and certain disorders involving the immune mechanism: Secondary | ICD-10-CM | POA: Diagnosis not present

## 2014-06-16 DIAGNOSIS — J449 Chronic obstructive pulmonary disease, unspecified: Secondary | ICD-10-CM | POA: Insufficient documentation

## 2014-06-16 DIAGNOSIS — Z8701 Personal history of pneumonia (recurrent): Secondary | ICD-10-CM | POA: Insufficient documentation

## 2014-06-16 DIAGNOSIS — I120 Hypertensive chronic kidney disease with stage 5 chronic kidney disease or end stage renal disease: Secondary | ICD-10-CM | POA: Insufficient documentation

## 2014-06-16 DIAGNOSIS — I2510 Atherosclerotic heart disease of native coronary artery without angina pectoris: Secondary | ICD-10-CM | POA: Insufficient documentation

## 2014-06-16 DIAGNOSIS — Z89512 Acquired absence of left leg below knee: Secondary | ICD-10-CM | POA: Insufficient documentation

## 2014-06-16 DIAGNOSIS — M79605 Pain in left leg: Secondary | ICD-10-CM

## 2014-06-16 DIAGNOSIS — I5023 Acute on chronic systolic (congestive) heart failure: Secondary | ICD-10-CM | POA: Insufficient documentation

## 2014-06-16 DIAGNOSIS — Z86718 Personal history of other venous thrombosis and embolism: Secondary | ICD-10-CM | POA: Insufficient documentation

## 2014-06-16 DIAGNOSIS — E119 Type 2 diabetes mellitus without complications: Secondary | ICD-10-CM | POA: Insufficient documentation

## 2014-06-16 DIAGNOSIS — N186 End stage renal disease: Secondary | ICD-10-CM | POA: Insufficient documentation

## 2014-06-16 DIAGNOSIS — Z992 Dependence on renal dialysis: Secondary | ICD-10-CM | POA: Diagnosis not present

## 2014-06-16 DIAGNOSIS — Z9889 Other specified postprocedural states: Secondary | ICD-10-CM | POA: Insufficient documentation

## 2014-06-16 DIAGNOSIS — Z79899 Other long term (current) drug therapy: Secondary | ICD-10-CM | POA: Insufficient documentation

## 2014-06-16 DIAGNOSIS — R112 Nausea with vomiting, unspecified: Secondary | ICD-10-CM | POA: Insufficient documentation

## 2014-06-16 DIAGNOSIS — Z794 Long term (current) use of insulin: Secondary | ICD-10-CM | POA: Diagnosis not present

## 2014-06-16 DIAGNOSIS — R011 Cardiac murmur, unspecified: Secondary | ICD-10-CM | POA: Insufficient documentation

## 2014-06-16 DIAGNOSIS — M79662 Pain in left lower leg: Secondary | ICD-10-CM | POA: Insufficient documentation

## 2014-06-16 LAB — COMPREHENSIVE METABOLIC PANEL
ALK PHOS: 123 U/L — AB (ref 39–117)
ALT: 6 U/L (ref 0–35)
AST: 12 U/L (ref 0–37)
Albumin: 3.7 g/dL (ref 3.5–5.2)
Anion gap: 18 — ABNORMAL HIGH (ref 5–15)
BILIRUBIN TOTAL: 0.4 mg/dL (ref 0.3–1.2)
BUN: 12 mg/dL (ref 6–23)
CHLORIDE: 93 meq/L — AB (ref 96–112)
CO2: 32 meq/L (ref 19–32)
Calcium: 10.5 mg/dL (ref 8.4–10.5)
Creatinine, Ser: 3.82 mg/dL — ABNORMAL HIGH (ref 0.50–1.10)
GFR, EST AFRICAN AMERICAN: 14 mL/min — AB (ref >90)
GFR, EST NON AFRICAN AMERICAN: 12 mL/min — AB (ref >90)
GLUCOSE: 143 mg/dL — AB (ref 70–99)
POTASSIUM: 3.5 meq/L — AB (ref 3.7–5.3)
SODIUM: 143 meq/L (ref 137–147)
Total Protein: 8.7 g/dL — ABNORMAL HIGH (ref 6.0–8.3)

## 2014-06-16 LAB — CBC WITH DIFFERENTIAL/PLATELET
BASOS ABS: 0 10*3/uL (ref 0.0–0.1)
Basophils Relative: 1 % (ref 0–1)
Eosinophils Absolute: 0.1 10*3/uL (ref 0.0–0.7)
Eosinophils Relative: 2 % (ref 0–5)
HEMATOCRIT: 38.1 % (ref 36.0–46.0)
Hemoglobin: 12.3 g/dL (ref 12.0–15.0)
LYMPHS ABS: 1.1 10*3/uL (ref 0.7–4.0)
LYMPHS PCT: 24 % (ref 12–46)
MCH: 28.7 pg (ref 26.0–34.0)
MCHC: 32.3 g/dL (ref 30.0–36.0)
MCV: 89 fL (ref 78.0–100.0)
Monocytes Absolute: 0.4 10*3/uL (ref 0.1–1.0)
Monocytes Relative: 9 % (ref 3–12)
NEUTROS ABS: 3.1 10*3/uL (ref 1.7–7.7)
Neutrophils Relative %: 64 % (ref 43–77)
PLATELETS: 206 10*3/uL (ref 150–400)
RBC: 4.28 MIL/uL (ref 3.87–5.11)
RDW: 15.4 % (ref 11.5–15.5)
WBC: 4.8 10*3/uL (ref 4.0–10.5)

## 2014-06-16 MED ORDER — OXYCODONE HCL 5 MG PO TABS: 5.0000 mg | ORAL_TABLET | Freq: Three times a day (TID) | ORAL | Status: DC | PRN

## 2014-06-16 MED ORDER — HYDROMORPHONE HCL 1 MG/ML IJ SOLN
1.0000 mg | Freq: Once | INTRAMUSCULAR | Status: AC
Start: 2014-06-16 — End: 2014-06-16
  Administered 2014-06-16: 1 mg via INTRAMUSCULAR
  Filled 2014-06-16 (×2): qty 1

## 2014-06-16 NOTE — ED Notes (Signed)
MD at bedside. 

## 2014-06-16 NOTE — ED Notes (Addendum)
Pt presents with pain to left leg  X 1 month that is increasing in intensity. Pt states that she is suppose to have vascular surgery on Friday with Dr. Darrick PennaFields "but the pan is to bad. Condition is chronic in nature. Condition is made better by nothing. Condition is made worse by dialysis machine. Pt goes dialysis M,T, TH and S. Pt went to treatment today but did complete the full cycle. Dr Kathrene BongoGoldsborough os nephrologist

## 2014-06-16 NOTE — Discharge Instructions (Signed)
As discussed, your evaluation today has been largely reassuring.  But, it is important that you monitor your condition carefully, and do not hesitate to return to the ED if you develop new, or concerning changes in your condition. ? ?Otherwise, please follow-up with your physician for appropriate ongoing care. ? ?

## 2014-06-16 NOTE — ED Provider Notes (Signed)
CSN: [id number]     Arrival date & time 06/16/14  0930 History   First MD Initiated Contact with Patient 06/16/14 [phone]     Chief Complaint  Patient presents with  . Leg Pain     (Consider location/radiation/quality/duration/timing/severity/associated sxs/prior Treatment) HPI Patient presents with worsening pain in her left distal lower extremity. Patient has history of bilateral lower extremity amputation, left below-the-knee, performed 5 years ago. The past days the patient has had increasing pain in the distal stump. No concurrent increase in size, or any fever, confusion, disorientation, other systemic changes. Did not complete dialysis today secondary to pain in the leg. Patient is scheduled vascular surgery followup in 3 days for possible proximal amputation.   Past Medical History  Diagnosis Date  . Hypertension   . Anemia   . Systolic CHF, acute on chronic   . Coronary artery disease   . Peripheral vascular disease   . CHF (congestive heart failure)   . Angina   . Blood transfusion ~2003; 08/12/2012    "when I started on dialysis; lower gi bleeding" (08/12/2012)  . Hypercholesteremia   . DVT of leg (deep venous thrombosis) 2011    "RLE" (08/12/2012)  . Type II diabetes mellitus   . Acute lower GI bleeding 08/12/2012  . Heart murmur     some say yes , some say no  . Shortness of breath 01/02/12    "@rest ; lying down; w/exertion"; no change 08/12/2012  . COPD (chronic obstructive pulmonary disease)   . ESRD on dialysis     [facility] and Mon 4 times aweek  . Pneumonia     Dec 2013  . Sickle cell trait    Past Surgical History  Procedure Laterality Date  . 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 Av fistula placement  ~ 2003    left upper arm  . Abdominal hysterectomy  1990's  . Arteriovenous graft placement  2011    left upper arm  . Vascular surgery    . Cardiac catheterization  ~ 2010  .  Cesarean section  1988  . Esophagogastroduodenoscopy  08/12/2012    Procedure: ESOPHAGOGASTRODUODENOSCOPY (EGD);  Laflam: Vertell NovakJames L Edwards Jr., MD;  [facility];  Service: Endoscopy;  Laterality: N/A;  . Colonoscopy  08/13/2012    Procedure: COLONOSCOPY;  Preziosi: Vertell NovakJames L Edwards Jr., MD;  [facility];  Service: Endoscopy;  Laterality: N/A;  . Resection of arteriovenous fistula aneurysm Left 03/19/2013    Procedure: EXCISION OF LEFT BRACHIOCEPHALIC ARTERIOVENOUS FISTULA PSEUDOANEURYSM;  Gergely: Fransisco HertzBrian L Chen, MD;  [facility];  Service: Vascular;  Laterality: Left;  . Insertion of dialysis catheter N/A 03/19/2013    Procedure: INSERTION OF DIALYSIS CATHETER;  Stepien: Fransisco HertzBrian L Chen, MD;  [facility];  Service: Vascular;  Laterality: N/A;  Right Internal Jugular Placement  . Revision of arteriovenous goretex graft Left 03/19/2013    Procedure: REVISION OF ARTERIOVENOUS GORETEX GRAFT;  Ashenfelter: Fransisco HertzBrian L Chen, MD;  Location: [facility] OR;  Service: Vascular;  Laterality: Left;  using 6mm x 20cm Gore-Tex Graft   Family History  Problem Relation Age of Onset  . Breast cancer Sister   . Breast cancer Sister   . Kidney failure Mother   . Diabetes Mother   . Cancer - Other Father    History  Substance Use Topics  . Smoking status: Never Smoker   .  Smokeless tobacco: Never Used  . Alcohol Use: No   OB History   Grav Para Term Preterm Abortions TAB SAB Ect Mult Living                 Review of Systems  Constitutional:       Per HPI, otherwise negative  HENT:       Per HPI, otherwise negative  Respiratory:       Per HPI, otherwise negative  Cardiovascular:       Per HPI, otherwise negative  Gastrointestinal: Positive for nausea and vomiting.       Patient typically has nausea, vomiting  Endocrine:       Negative aside from HPI  Genitourinary:       Neg aside from HPI   Musculoskeletal:       Per HPI, otherwise negative  Skin: Negative.   Neurological: Negative  for syncope.      Allergies  Review of patient's allergies indicates no known allergies.  Home Medications   Prior to Admission medications   Medication Sig Start Date End Date Taking? Authorizing Provider  amLODipine (NORVASC) 10 MG tablet Take 10 mg by mouth at bedtime.     Historical Provider, MD  BENICAR 40 MG tablet Take 1 tablet by mouth daily. 01/06/13   Historical Provider, MD  cinacalcet (SENSIPAR) 60 MG tablet Take 60 mg by mouth daily.    Historical Provider, MD  famotidine (PEPCID) 20 MG tablet TAKE ONE TABLET BY MOUTH AT BEDTIME 07/29/13   Nyoka CowdenMichael B Wert, MD  insulin aspart (NOVOLOG) 100 UNIT/ML injection Inject 1-5 Units into the skin 3 (three) times daily before meals. Sliding scale    Historical Provider, MD  insulin glargine (LANTUS) 100 UNIT/ML injection Inject 16 Units into the skin at bedtime. 08/14/12   Penny Piarlando Vega, MD  losartan (COZAAR) 50 MG tablet Take 50 mg by mouth at bedtime.    Historical Provider, MD  LYRICA 50 MG capsule daily. 04/20/14   Historical Provider, MD  oxyCODONE (ROXICODONE) 5 MG immediate release tablet 1 tablet every 8 hours as needed for severe left stump pain 05/29/14   Carma LairSuzanne L Nickel, NP  RENVELA 800 MG tablet 2 with Meals and one with snacks 04/11/14   Historical Provider, MD  Sucroferric Oxyhydroxide (VELPHORO) 500 MG CHEW Chew 1 tablet by mouth 3 (three) times daily with meals. Also she takes if she has a snack.    Historical Provider, MD   BP 197/80  Pulse 98  Temp(Src) 97.6 F (36.4 C) (Oral)  Resp 18  Wt 150 lb (68.04 kg)  SpO2 100% Physical Exam  Nursing note and vitals reviewed. Constitutional: She is oriented to person, place, and time. She appears well-developed and well-nourished. No distress.  HENT:  Head: Normocephalic and atraumatic.  Eyes: Conjunctivae and EOM are normal.  Cardiovascular: Normal rate and regular rhythm.   There is a faintly palpable popliteal pulse on the left lower extreme  Pulmonary/Chest: Effort  normal and breath sounds normal. No stridor. No respiratory distress.  Abdominal: She exhibits no distension.  Musculoskeletal: She exhibits no edema.  Bilateral lower extremity amputation, right above the knee, left below-the-knee. The left stump is clean, dry, with multiple areas of excoriation on the distal edge, none of which have erythema, drainage, bleeding. Area is tender to palpation  Neurological: She is alert and oriented to person, place, and time. No cranial nerve deficit.  Skin: Skin is warm and dry.  Psychiatric: She has a  normal mood and affect.    ED Course  Procedures (including critical care time) Labs Review Labs Reviewed  COMPREHENSIVE METABOLIC PANEL - Abnormal; Notable for the following:    Potassium 3.5 (*)    Chloride 93 (*)    Glucose, Bld 143 (*)    Creatinine, Ser 3.82 (*)    Total Protein 8.7 (*)    Alkaline Phosphatase 123 (*)    GFR calc non Af Amer 12 (*)    GFR calc Af Amer 14 (*)    Anion gap 18 (*)    All other components within normal limits  CBC WITH DIFFERENTIAL    On cardiac monitor the patient has heart rate, 95, regular, normal She is moderately hypertensive.  I reviewed the patient's EMR.  12:12 PM On repeat exam the patient is in no distress, pain is diminished, no new complaints.  MDM   Patient presents with pain in her previously amputated leg. No evidence for infection, nor lateral abnormalities.  Pain resolved.  Patient was discharged to followup as scheduled with her vascular Gutierrez in 3 days.    Gerhard Munch, MD 06/16/14 1212

## 2014-06-18 MED ORDER — SODIUM CHLORIDE 0.9 % IJ SOLN: 3.0000 mL | INTRAMUSCULAR | Status: DC | PRN

## 2014-06-19 ENCOUNTER — Other Ambulatory Visit: Payer: Self-pay | Admitting: *Deleted

## 2014-06-19 ENCOUNTER — Encounter (HOSPITAL_COMMUNITY): Admission: RE | Disposition: A | Discharge status: Home / Self Care | Payer: Self-pay | Attending: Vascular Surgery

## 2014-06-19 ENCOUNTER — Ambulatory Visit (HOSPITAL_COMMUNITY)
Admission: RE | Admit: 2014-06-19 | Discharge: 2014-06-19 | Disposition: A | Discharge status: Home / Self Care | Payer: Medicare Other | Attending: Vascular Surgery | Admitting: Vascular Surgery

## 2014-06-19 ENCOUNTER — Telehealth: Payer: Self-pay | Admitting: Vascular Surgery

## 2014-06-19 DIAGNOSIS — Z89611 Acquired absence of right leg above knee: Secondary | ICD-10-CM | POA: Diagnosis not present

## 2014-06-19 DIAGNOSIS — E119 Type 2 diabetes mellitus without complications: Secondary | ICD-10-CM | POA: Insufficient documentation

## 2014-06-19 DIAGNOSIS — L97829 Non-pressure chronic ulcer of other part of left lower leg with unspecified severity: Secondary | ICD-10-CM | POA: Diagnosis not present

## 2014-06-19 DIAGNOSIS — I2510 Atherosclerotic heart disease of native coronary artery without angina pectoris: Secondary | ICD-10-CM | POA: Insufficient documentation

## 2014-06-19 DIAGNOSIS — E780 Pure hypercholesterolemia: Secondary | ICD-10-CM | POA: Diagnosis not present

## 2014-06-19 DIAGNOSIS — I5022 Chronic systolic (congestive) heart failure: Secondary | ICD-10-CM | POA: Diagnosis not present

## 2014-06-19 DIAGNOSIS — D573 Sickle-cell trait: Secondary | ICD-10-CM | POA: Diagnosis not present

## 2014-06-19 DIAGNOSIS — I70202 Unspecified atherosclerosis of native arteries of extremities, left leg: Secondary | ICD-10-CM | POA: Insufficient documentation

## 2014-06-19 DIAGNOSIS — T8789 Other complications of amputation stump: Secondary | ICD-10-CM | POA: Insufficient documentation

## 2014-06-19 DIAGNOSIS — J449 Chronic obstructive pulmonary disease, unspecified: Secondary | ICD-10-CM | POA: Insufficient documentation

## 2014-06-19 DIAGNOSIS — I120 Hypertensive chronic kidney disease with stage 5 chronic kidney disease or end stage renal disease: Secondary | ICD-10-CM | POA: Diagnosis not present

## 2014-06-19 DIAGNOSIS — T8131XA Disruption of external operation (surgical) wound, not elsewhere classified, initial encounter: Secondary | ICD-10-CM

## 2014-06-19 DIAGNOSIS — Z794 Long term (current) use of insulin: Secondary | ICD-10-CM | POA: Insufficient documentation

## 2014-06-19 DIAGNOSIS — N186 End stage renal disease: Secondary | ICD-10-CM | POA: Diagnosis not present

## 2014-06-19 DIAGNOSIS — Z79891 Long term (current) use of opiate analgesic: Secondary | ICD-10-CM | POA: Insufficient documentation

## 2014-06-19 DIAGNOSIS — Z992 Dependence on renal dialysis: Secondary | ICD-10-CM | POA: Insufficient documentation

## 2014-06-19 DIAGNOSIS — L98492 Non-pressure chronic ulcer of skin of other sites with fat layer exposed: Secondary | ICD-10-CM

## 2014-06-19 DIAGNOSIS — Z89512 Acquired absence of left leg below knee: Secondary | ICD-10-CM | POA: Insufficient documentation

## 2014-06-19 HISTORY — PX: ABDOMINAL AORTAGRAM: SHX5454

## 2014-06-19 LAB — POCT I-STAT, CHEM 8
BUN: 28 mg/dL — ABNORMAL HIGH (ref 6–23)
CHLORIDE: 93 meq/L — AB (ref 96–112)
CREATININE: 6.7 mg/dL — AB (ref 0.50–1.10)
Calcium, Ion: 1.14 mmol/L (ref 1.12–1.23)
Glucose, Bld: 201 mg/dL — ABNORMAL HIGH (ref 70–99)
HCT: 43 % (ref 36.0–46.0)
Hemoglobin: 14.6 g/dL (ref 12.0–15.0)
POTASSIUM: 3.3 meq/L — AB (ref 3.7–5.3)
SODIUM: 136 meq/L — AB (ref 137–147)
TCO2: 30 mmol/L (ref 0–100)

## 2014-06-19 LAB — GLUCOSE, CAPILLARY: Glucose-Capillary: 195 mg/dL — ABNORMAL HIGH (ref 70–99)

## 2014-06-19 SURGERY — ABDOMINAL AORTAGRAM
Anesthesia: LOCAL

## 2014-06-19 MED ORDER — ACETAMINOPHEN 325 MG PO TABS: 325.0000 mg | ORAL_TABLET | ORAL | Status: DC | PRN

## 2014-06-19 MED ORDER — LIDOCAINE HCL (PF) 1 % IJ SOLN
INTRAMUSCULAR | Status: AC
  Filled 2014-06-19: qty 30

## 2014-06-19 MED ORDER — ONDANSETRON HCL 4 MG/2ML IJ SOLN: 4.0000 mg | Freq: Four times a day (QID) | INTRAMUSCULAR | Status: DC | PRN

## 2014-06-19 MED ORDER — MIDAZOLAM HCL 2 MG/2ML IJ SOLN
INTRAMUSCULAR | Status: AC
  Filled 2014-06-19: qty 2

## 2014-06-19 MED ORDER — ACETAMINOPHEN 325 MG RE SUPP: 325.0000 mg | RECTAL | Status: DC | PRN

## 2014-06-19 MED ORDER — HEPARIN (PORCINE) IN NACL 2-0.9 UNIT/ML-% IJ SOLN
INTRAMUSCULAR | Status: AC
  Filled 2014-06-19: qty 1000

## 2014-06-19 MED ORDER — DOCUSATE SODIUM 100 MG PO CAPS: 100.0000 mg | ORAL_CAPSULE | Freq: Every day | ORAL | Status: DC

## 2014-06-19 MED ORDER — LABETALOL HCL 5 MG/ML IV SOLN
INTRAVENOUS | Status: AC
  Filled 2014-06-19: qty 4

## 2014-06-19 MED ORDER — LABETALOL HCL 5 MG/ML IV SOLN: 10.0000 mg | INTRAVENOUS | Status: DC | PRN

## 2014-06-19 MED ORDER — ALUM & MAG HYDROXIDE-SIMETH 200-200-20 MG/5ML PO SUSP: 15.0000 mL | ORAL | Status: DC | PRN

## 2014-06-19 MED ORDER — METOPROLOL TARTRATE 1 MG/ML IV SOLN: 2.0000 mg | INTRAVENOUS | Status: DC | PRN

## 2014-06-19 MED ORDER — HYDRALAZINE HCL 20 MG/ML IJ SOLN: 10.0000 mg | INTRAMUSCULAR | Status: DC | PRN

## 2014-06-19 MED ORDER — MORPHINE SULFATE 10 MG/ML IJ SOLN
2.0000 mg | INTRAMUSCULAR | Status: DC | PRN
Start: 2014-06-19 — End: 2014-06-19

## 2014-06-19 MED ORDER — PANTOPRAZOLE SODIUM 40 MG PO TBEC: 40.0000 mg | DELAYED_RELEASE_TABLET | Freq: Every day | ORAL | Status: DC

## 2014-06-19 NOTE — Discharge Instructions (Signed)
Angiogram, Care After ° °Refer to this sheet in the next few weeks. These instructions provide you with information on caring for yourself after your procedure. Your health care provider may also give you more specific instructions. Your treatment has been planned according to current medical practices, but problems sometimes occur. Call your health care provider if you have any problems or questions after your procedure.  °WHAT TO EXPECT AFTER THE PROCEDURE °After your procedure, it is typical to have the following sensations: °· Minor discomfort or tenderness and a small bump at the catheter insertion site. The bump should usually decrease in size and tenderness within 1 to 2 weeks. °· Any bruising will usually fade within 2 to 4 weeks. °HOME CARE INSTRUCTIONS  °· You may need to keep taking blood thinners if they were prescribed for you. Take medicines only as directed by your health care provider. °· Do not apply powder or lotion to the site. °· Do not take baths, swim, or use a hot tub until your health care provider approves. °· You may shower 24 hours after the procedure. Remove the bandage (dressing) and gently wash the site with plain soap and water. Gently pat the site dry. °· Inspect the site at least twice daily. °· Limit your activity for the first 24 hours. Do not bend, squat, or lift anything over 10 lb (9 kg) or as directed by your health care provider. °· Plan to have someone take you home after the procedure. Follow instructions about when you can drive or return to work. °SEEK MEDICAL CARE IF: °· You get light-headed when standing up. °· You have drainage (other than a small amount of blood on the dressing). °· You have chills. °· You have a fever. °· You have redness, warmth, swelling, or pain at the insertion site. °SEEK IMMEDIATE MEDICAL CARE IF:  °· You develop chest pain or shortness of breath, feel faint, or pass out. °· You have bleeding, swelling larger than a walnut, or drainage from the  catheter insertion site. °· You develop pain, discoloration, coldness, or severe bruising in the leg or arm that held the catheter. °· You have heavy bleeding from the site. If this happens, hold pressure on the site. °MAKE SURE YOU: °· Understand these instructions. °· Will watch your condition. °· Will get help right away if you are not doing well or get worse. °Document Released: 03/09/2005 Document Revised: 01/05/2014 Document Reviewed: 01/13/2013 °ExitCare® Patient Information ©2015 [employer]. This information is not intended to replace advice given to you by your health care provider. Make sure you discuss any questions you have with your health care provider. ° °

## 2014-06-19 NOTE — Interval H&P Note (Signed)
History and Physical Interval Note:  06/19/2014 8:36 AM  [name]  has presented today for surgery, with the diagnosis of pvd  The various methods of treatment have been discussed with the patient and family. After consideration of risks, benefits and other options for treatment, the patient has consented to  Procedure(s): ABDOMINAL AORTAGRAM (N/A) as a surgical intervention .  The patient's history has been reviewed, patient examined, no change in status, stable for surgery.  I have reviewed the patient's chart and labs.  Questions were answered to the patient's satisfaction.     Caryssa Elzey E

## 2014-06-19 NOTE — Op Note (Signed)
Procedure: Aortogram with lower extremity runoff  Preoperative diagnosis: Nonhealing wound left below-knee amputation  Preoperative diagnosis: Same  Anesthesia: Local with IV sedation  Operative findings: Patent aortoiliac system with patent left superficial femoral artery diffusely diseased but with in-line flow  Operative details: After obtaining informed consent, the patient was brought to the PV lab. The patient was placed in supine position the Angio table. Both groins were prepped and draped in usual sterile fashion. Local anesthesia was infiltrated over the right common femoral artery. Ultrasound was used to identify the right common femoral artery. An introducer needle was used to cannulate the right common femoral artery using ultrasound guidance. An 39035 versacore was then threaded up in the abdominal aorta under fluoroscopic guidance. A 5 French sheath was placed over the guidewire and the right common femoral artery. This was thoroughly flushed with heparinized saline. A 5 French pigtail catheter was then placed over the guidewire and the abdominal aorta and abdominal aortogram obtained in the AP projection. Infrarenal abdominal aorta is patent. The left and right common and external iliac arteries are patent. The internal iliac arteries are small but patent. Next an oblique view was performed and a 20 RAO projection to better visualize the left iliac system. This was done with magnification. This again confirms of the left common external and internal arteries are patent. Next the pigtail catheter was removed over guidewire and replaced with a 5 [place] crossover catheter. This was used to selectively catheterize the left common iliac artery. The versacore was advanced down into the distal left external iliac artery. A 5 French crossover catheter was then exchanged over guidewire for a 5 French straight catheter. Left lower extremity runoff views were obtained.  The left common femoral artery  is patent. The left profunda femoris artery is patent. The left superficial femoral artery has several areas of 25-50% stenosis but is patent throughout its course.  The 5 French straight catheter was removed over guidewire. The 5 French sheath was thoroughly flushed with heparinized saline. He was left in place to be pulled in the holding area. The patient tolerated procedure well and there were no complications.  Operative management: The patient is a nonhealing wound of her left below-knee amputation. No vascular intervention planned to improve flow to the left leg. Continue local wound care for the left below-knee amputation. If this fails to heal she will need a left above-knee amputation.  Fabienne Brunsharles Susana Duell, MD [facility] Office: [phone] Pager: [phone]

## 2014-06-19 NOTE — H&P (View-Only) (Signed)
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 She developed an ulceration on her left below-knee amputation and some pain approximately one month ago. She was recently seen by our nurse practitioner in her aortoiliac duplex ordered. She returns today for followup. He still has some pain in the ulceration. They have not progressed significantly. They are dry. Both of her amputations were performed approximately 4 years ago. She has not had problems with them since that time. She is chronically on hemodialysis and dialyzes Monday Tuesday Thursday and Saturday due to problems with fluid overload. She has not had any episodes of congestive failure recently. Chronic medical problems include diabetes, elevated cholesterol, hypertension, coronary artery disease, congestive failure. These are all currently stable.  Review of systems: She denies chest pain. She denies shortness of breath.  Past Medical History  Diagnosis Date  . Hypertension   . Anemia   . Systolic CHF, acute on chronic   . Coronary artery disease   . Peripheral vascular disease   . CHF (congestive heart failure)   . Angina   . Blood transfusion ~2003; 08/12/2012    "when I started on dialysis; lower gi bleeding" (08/12/2012)  . Hypercholesteremia   . DVT of leg (deep venous thrombosis) 2011    "RLE" (08/12/2012)  . Type II diabetes mellitus   . Acute lower GI bleeding 08/12/2012  . Heart murmur     some say yes , some say no  . Shortness of breath 01/02/12    "@rest ; lying down; w/exertion"; no change 08/12/2012  . COPD (chronic obstructive pulmonary disease)   . ESRD on dialysis     "[facility] hemod[facility] and Mon 4 times aweek  . Pneumonia     Dec 2013  . Sickle cell trait      Past Surgical History  Procedure Laterality Date  . 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 Av fistula placement  ~ 2003     left upper arm  . Abdominal hysterectomy  1990's  . Arteriovenous graft placement  2011    left upper arm  . Vascular surgery    . Cardiac catheterization  ~ 2010  . Cesarean section  1988  . Esophagogastroduodenoscopy  08/12/2012    Procedure: ESOPHAGOGASTRODUODENOSCOPY (EGD);  [place]: Vertell NovakJames L Edwards Jr., MD;  [facility];  Service: Endoscopy;  Laterality: N/A;  . Colonoscopy  08/13/2012    Procedure: COLONOSCOPY;  [place]: Vertell NovakJames L Edwards Jr., MD;  [facility];  Service: Endoscopy;  Laterality: N/A;  . Resection of arteriovenous fistula aneurysm Left 03/19/2013    Procedure: EXCISION OF LEFT BRACHIOCEPHALIC ARTERIOVENOUS FISTULA PSEUDOANEURYSM;  [place]: Fransisco HertzBrian L Chen, MD;  [facility];  Service: Vascular;  Laterality: Left;  . Insertion of dialysis catheter N/A 03/19/2013    Procedure: INSERTION OF DIALYSIS CATHETER;  [place]: Fransisco HertzBrian L Chen, MD;  [facility];  Service: Vascular;  Laterality: N/A;  Right Internal Jugular Placement  . Revision of arteriovenous goretex graft Left 03/19/2013    Procedure: REVISION OF ARTERIOVENOUS GORETEX GRAFT;  [place]: Fransisco HertzBrian L Chen, MD;  [facility];  Service: Vascular;  Laterality: Left;  using 6mm x 20cm Gore-Tex Graft   History   Social History  . Marital Status: Married    Spouse Name: N/A    Number of Children: N/A  . Years  of Education: N/A   Occupational History  . Not on file.   Social History Main Topics  . Smoking status: Never Smoker   . Smokeless tobacco: Never Used  . Alcohol Use: No  . Drug Use: No  . Sexual Activity: Yes   Other Topics Concern  . Not on file   Social History Narrative  . No narrative on file   Current Outpatient Prescriptions on File Prior to Visit  Medication Sig Dispense Refill  . amLODipine (NORVASC) 10 MG tablet Take 10 mg by mouth at bedtime.       Marland Kitchen. BENICAR 40 MG tablet Take 1 tablet by mouth daily.      . cinacalcet (SENSIPAR) 60 MG tablet Take 60 mg by mouth  daily.      . famotidine (PEPCID) 20 MG tablet TAKE ONE TABLET BY MOUTH AT BEDTIME  30 tablet  0  . insulin aspart (NOVOLOG) 100 UNIT/ML injection Inject 1-5 Units into the skin 3 (three) times daily before meals. Sliding scale      . insulin glargine (LANTUS) 100 UNIT/ML injection Inject 16 Units into the skin at bedtime.  10 mL  0  . losartan (COZAAR) 50 MG tablet Take 50 mg by mouth at bedtime.      Marland Kitchen. LYRICA 50 MG capsule daily.      Marland Kitchen. oxyCODONE (ROXICODONE) 5 MG immediate release tablet 1 tablet every 8 hours as needed for severe left stump pain  14 tablet  0  . RENVELA 800 MG tablet 2 with Meals and one with snacks      . Sucroferric Oxyhydroxide (VELPHORO) 500 MG CHEW Chew 1 tablet by mouth 3 (three) times daily with meals. Also she takes if she has a snack.       No current facility-administered medications on file prior to visit.     Physical exam:  Filed Vitals:   06/04/14 1108  BP: 190/81  Pulse: 96  Temp: 97.6 F (36.4 C)  TempSrc: Oral  Resp: 16  Height: 5' 1.5" (1.562 m)  Weight: 159 lb (72.122 kg)  SpO2: 96%    Extremities: Well healed right above-knee amputation 2+ femoral pulse, left below-knee amputation 2 areas of ulceration one approximately 3 cm diameter one approximately 2 cm diameter a 3 cm diameter one was in the anterior aspect of the distal aspect of the stump the 2 cm one is more posterior. These are both dry and less than 1 mm depth. She has a very difficult to palpate left femoral pulse.  Data: The patient had aortoiliac duplex performed recently. I reviewed the findings of this today. 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 Right iliac system was widely patent.  Assessment: Failing left below-knee amputation possibly secondary to decreased flow from aortoiliac system left side  Plan: Aortogram lower extremity runoff possible intervention scheduled for 06/19/2014. Risks benefits possible complications and procedure details were  trying to the patient and her daughter today. Understand agree to proceed. They also understand that the left below-knee amputation continues to be painful and has not healed despite increased inflow. She may need to consider a left above-knee amputation.  Fabienne Brunsharles Fields, MD [facility] [place] Office: [phone] Pager: [phone]

## 2014-06-19 NOTE — Telephone Encounter (Addendum)
Message copied by Fredrich BirksMILLIKAN, DANA P on Fri Jun 19, 2014  3:37 PM ------      Message from: Sharee PimpleMCCHESNEY, MARILYN K      Created: Fri Jun 19, 2014 10:31 AM      Regarding: Schedule                   ----- Message -----         From: Sherren Kernsharles E Fields, MD         Sent: 06/19/2014   9:25 AM           To: [facility] Charge Pool            Aortogram with runoff      Right femoral puncture      Ultrasound      2nd order left external iliac            She needs an appt with the wound center.  Follow up with me PRN if wound fails to heal she will need AKA.            [name] ------  06/19/14: received a call from patients daughter who stated that her mother would like to go ahead with AKA. I transferred her to our clinical triage nurse to schedule. [facility]

## 2014-06-19 NOTE — Progress Notes (Signed)
Site area: RFA Site Prior to Removal:  Level 0 Pressure Applied For:20 min Manual:    Patient Status During Pull:   Post Pull Site:  Level0 Post Pull Instructions Given:   Post Pull Pulses Present:  Dressing Applied: clear  Bedrest begins @ 0950 Comments:removed by Lorel MonacoLaura M RN

## 2014-06-22 ENCOUNTER — Other Ambulatory Visit: Payer: Self-pay | Admitting: *Deleted

## 2014-06-29 ENCOUNTER — Encounter (HOSPITAL_COMMUNITY): Payer: Self-pay

## 2014-06-29 ENCOUNTER — Encounter (HOSPITAL_COMMUNITY)
Admission: RE | Admit: 2014-06-29 | Discharge: 2014-06-29 | Disposition: A | Discharge status: Home / Self Care | Payer: Medicare Other | Source: Ambulatory Visit | Attending: Vascular Surgery | Admitting: Vascular Surgery

## 2014-06-29 DIAGNOSIS — I10 Essential (primary) hypertension: Secondary | ICD-10-CM | POA: Diagnosis not present

## 2014-06-29 DIAGNOSIS — Z01818 Encounter for other preprocedural examination: Secondary | ICD-10-CM | POA: Insufficient documentation

## 2014-06-29 DIAGNOSIS — I739 Peripheral vascular disease, unspecified: Secondary | ICD-10-CM | POA: Insufficient documentation

## 2014-06-29 DIAGNOSIS — D573 Sickle-cell trait: Secondary | ICD-10-CM | POA: Insufficient documentation

## 2014-06-29 DIAGNOSIS — Z89611 Acquired absence of right leg above knee: Secondary | ICD-10-CM | POA: Diagnosis not present

## 2014-06-29 DIAGNOSIS — Z992 Dependence on renal dialysis: Secondary | ICD-10-CM | POA: Insufficient documentation

## 2014-06-29 DIAGNOSIS — I071 Rheumatic tricuspid insufficiency: Secondary | ICD-10-CM | POA: Insufficient documentation

## 2014-06-29 DIAGNOSIS — R011 Cardiac murmur, unspecified: Secondary | ICD-10-CM | POA: Diagnosis not present

## 2014-06-29 DIAGNOSIS — D649 Anemia, unspecified: Secondary | ICD-10-CM | POA: Insufficient documentation

## 2014-06-29 DIAGNOSIS — J449 Chronic obstructive pulmonary disease, unspecified: Secondary | ICD-10-CM | POA: Insufficient documentation

## 2014-06-29 DIAGNOSIS — Z89512 Acquired absence of left leg below knee: Secondary | ICD-10-CM | POA: Insufficient documentation

## 2014-06-29 DIAGNOSIS — E780 Pure hypercholesterolemia: Secondary | ICD-10-CM | POA: Diagnosis not present

## 2014-06-29 DIAGNOSIS — E118 Type 2 diabetes mellitus with unspecified complications: Secondary | ICD-10-CM | POA: Diagnosis not present

## 2014-06-29 DIAGNOSIS — Z86718 Personal history of other venous thrombosis and embolism: Secondary | ICD-10-CM | POA: Diagnosis not present

## 2014-06-29 DIAGNOSIS — I517 Cardiomegaly: Secondary | ICD-10-CM | POA: Diagnosis not present

## 2014-06-29 DIAGNOSIS — I2510 Atherosclerotic heart disease of native coronary artery without angina pectoris: Secondary | ICD-10-CM | POA: Insufficient documentation

## 2014-06-29 DIAGNOSIS — I5022 Chronic systolic (congestive) heart failure: Secondary | ICD-10-CM | POA: Insufficient documentation

## 2014-06-29 DIAGNOSIS — I340 Nonrheumatic mitral (valve) insufficiency: Secondary | ICD-10-CM | POA: Insufficient documentation

## 2014-06-29 DIAGNOSIS — I4581 Long QT syndrome: Secondary | ICD-10-CM | POA: Diagnosis not present

## 2014-06-29 DIAGNOSIS — T8189XD Other complications of procedures, not elsewhere classified, subsequent encounter: Secondary | ICD-10-CM | POA: Insufficient documentation

## 2014-06-29 DIAGNOSIS — N186 End stage renal disease: Secondary | ICD-10-CM | POA: Diagnosis not present

## 2014-06-29 NOTE — Pre-Procedure Instructions (Signed)
Anastazja C Gancarz  06/29/2014   Your procedure is scheduled on:  October 28  [facility] at 09:30 AM.  Call this number if you have problems the morning of surgery: [phone]   Remember:   Do not eat food or drink liquids after midnight.   Take these medicines the morning of surgery with A SIP OF WATER: Amlodipine, Oxycodone (if needed)   Take 13 units of insulin the night before   STOP/ Do not take Aspirin, Aleve, Naproxen, Advil, Ibuprofen, Motrin, Vitamins, Herbs, or Supplements starting today   Do not wear jewelry, make-up or nail polish.  Do not wear lotions, powders, or perfumes. You may wear deodorant.  Do not shave 48 hours prior to surgery. Men may shave face and neck.  Do not bring valuables to the hospital.  [facility] is not responsible for any belongings or valuables.               Contacts, dentures or bridgework may not be worn into surgery.  Leave suitcase in the car. After surgery it may be brought to your room.  For patients admitted to the hospital, discharge time is determined by your treatment team.               Patients discharged the day of surgery will not be allowed to drive home.  Name and phone number of your driver: Family/ Friend  Special Instructions: [place] - Preparing for Surgery  Before surgery, you can play an important role.  Because skin is not sterile, your skin needs to be as free of germs as possible.  You can reduce the number of germs on you skin by washing with CHG (chlorahexidine gluconate) soap before surgery.  CHG is an antiseptic cleaner which kills germs and bonds with the skin to continue killing germs even after washing.  Please DO NOT use if you have an allergy to CHG or antibacterial soaps.  If your skin becomes reddened/irritated stop using the CHG and inform your nurse when you arrive at Short Stay.  Do not shave (including legs and underarms) for at least 48 hours prior to the first CHG  shower.  You may shave your face.  Please follow these instructions carefully:   1.  Shower with CHG Soap the night before surgery and the morning of Surgery.  2.  If you choose to wash your hair, wash your hair first as usual with your normal shampoo.  3.  After you shampoo, rinse your hair and body thoroughly to remove the shampoo.  4.  Use CHG as you would any other liquid soap.  You can apply CHG directly to the skin and wash gently with scrungie or a clean washcloth.  5.  Apply the CHG Soap to your body ONLY FROM THE NECK DOWN.  Do not use on open wounds or open sores.  Avoid contact with your eyes, ears, mouth and genitals (private parts).  Wash genitals (private parts) with your normal soap.  6.  Wash thoroughly, paying special attention to the area where your surgery will be performed.  7.  Thoroughly rinse your body with warm water from the neck down.  8.  DO NOT shower/wash with your normal soap after using and rinsing off the CHG Soap.  9.  Pat yourself dry with a clean towel.            10.  Wear clean pajamas.  11.  Place clean sheets on your bed the night of your first shower and do not sleep with pets.  Day of Surgery  Do not apply any lotions the morning of surgery.  Please wear clean clothes to the hospital/surgery center.     Please read over the following fact sheets that you were given: Pain Booklet, Coughing and Deep Breathing and Surgical Site Infection Prevention

## 2014-06-29 NOTE — Progress Notes (Signed)
Per lab 2 people were unable to obtain lab work. Spoke to BristolMarilyn, Charity fundraiserN at [facility] states ok to obtain I- Stat DOS

## 2014-06-30 ENCOUNTER — Ambulatory Visit (HOSPITAL_COMMUNITY)
Admission: RE | Admit: 2014-06-30 | Discharge: 2014-06-30 | Disposition: A | Discharge status: Home / Self Care | Payer: Medicare Other | Source: Ambulatory Visit | Attending: Cardiology | Admitting: Cardiology

## 2014-06-30 ENCOUNTER — Other Ambulatory Visit (HOSPITAL_COMMUNITY): Payer: Self-pay | Admitting: *Deleted

## 2014-06-30 ENCOUNTER — Other Ambulatory Visit: Payer: Self-pay

## 2014-06-30 DIAGNOSIS — I340 Nonrheumatic mitral (valve) insufficiency: Secondary | ICD-10-CM | POA: Insufficient documentation

## 2014-06-30 DIAGNOSIS — I2510 Atherosclerotic heart disease of native coronary artery without angina pectoris: Secondary | ICD-10-CM | POA: Diagnosis not present

## 2014-06-30 DIAGNOSIS — E119 Type 2 diabetes mellitus without complications: Secondary | ICD-10-CM | POA: Insufficient documentation

## 2014-06-30 DIAGNOSIS — I272 Other secondary pulmonary hypertension: Secondary | ICD-10-CM | POA: Diagnosis not present

## 2014-06-30 DIAGNOSIS — Z01810 Encounter for preprocedural cardiovascular examination: Secondary | ICD-10-CM

## 2014-06-30 DIAGNOSIS — I509 Heart failure, unspecified: Secondary | ICD-10-CM | POA: Diagnosis not present

## 2014-06-30 DIAGNOSIS — I361 Nonrheumatic tricuspid (valve) insufficiency: Secondary | ICD-10-CM | POA: Diagnosis not present

## 2014-06-30 DIAGNOSIS — J449 Chronic obstructive pulmonary disease, unspecified: Secondary | ICD-10-CM | POA: Insufficient documentation

## 2014-06-30 DIAGNOSIS — I059 Rheumatic mitral valve disease, unspecified: Secondary | ICD-10-CM

## 2014-06-30 DIAGNOSIS — I5189 Other ill-defined heart diseases: Secondary | ICD-10-CM | POA: Insufficient documentation

## 2014-06-30 DIAGNOSIS — I517 Cardiomegaly: Secondary | ICD-10-CM | POA: Diagnosis not present

## 2014-06-30 MED ORDER — SODIUM CHLORIDE 0.9 % IV SOLN: INTRAVENOUS | Status: DC

## 2014-06-30 MED ORDER — DEXTROSE 5 % IV SOLN: 1.5000 g | INTRAVENOUS | Status: DC

## 2014-06-30 NOTE — Progress Notes (Signed)
Late entry: Spoke with Revonda StandardAllison, [place] in regards to what dose of insulin for patient to take. She informed me of dose to tell patient to take

## 2014-06-30 NOTE — Progress Notes (Addendum)
Anesthesia Chart Review:  Patient is a 53 year old female scheduled for left AKA on 07/01/14 by Dr. Darrick PennaFields. She has a non-healing wound of her left BKA.  History includes non-smoker, CAD, murmur, chronic systolic CHF, PAD s/p left BKA and right AKA '11, hypercholesterolemia, HTN, DM2, COPD, Sickle cell trait, ESRD on hemodialysis (HD 4X/week on M,Tu, Th, Sa), RLE DVT '11, anemia. She had a cardiac cath in 2011 showing 50-60% LAD and RCA stenosis for history of chest pain with minimally elevated troponin felt due to decompensated systolic CHF. Nephrologist is Dr. Kathrene BongoGoldsborough. I confirmed with each office that she had not seen cardiologists Dr. Sharyn LullHarwani (not since 2011) or Dr. Shana ChuteSpruill (not in years) recently.    Meds: Benicar, Norvasc, Lantus, Renvela.  EKG on 06/19/14 showed: NSR, possible LAE, non-specific ST/T wave abnormality, prolonged QT.  The interpreting cardiologist felt it was not significantly changed since last tracing.  Echo on 10/10/11 showed:  - Left ventricle: The cavity size was normal. There was mild concentric hypertrophy. Systolic function was mildly reduced. The estimated ejection fraction was in the range of 45% to 50%. Diffuse hypokinesis. - Aortic valve: Calcification. Moderate focal calcification involving the left coronary cusp. - Mitral valve: Moderate regurgitation directed posteriorly. - Left atrium: The atrium was mildly dilated. - Right ventricle: Systolic function was mildly reduced. - Tricuspid valve: Moderate regurgitation. - Pulmonary arteries: Systolic pressure was mildly to moderately increased. PA peak pressure: 46mm Hg (S). (Echo on 03/22/11 showed LVEF 50-55%, normal wall motion without regional wall motion abnormalities. PA peak pressure 31 mmHg.)  Nuclear stress test on 06/23/11 showed: IMPRESSION: No definite inducible ischemia with pharmacologic stress. Global hypokinesis and 36% ejection fraction.  Cardiac cath on 02/09/10 (Dr. Sharyn LullHarwani) showed: Good LV  systolic function, mild LVH, EF 50-55%. LM patent. 50-60% ostial and proximal LAD stenosis. D1-3 very small. 20-25% proximal Ramus. 10-15% proximal CX. 20-30% small OM1. OM2 patent. 50-60% proximal RCA stenosis which appeared more severe in RAO cranial view as compared to LAO views.  PDA and PLV branches were small and patent. Rec: Maximize medical management.  PAT staff were unable to obtain blood for pre-operative labs during her PAT visit yesterday, so labs would need to be done on the day of surgery.  Above reviewed with anesthesiologist Dr. Aleene DavidsonE. Fitzgerald.  Patient with known CAD, CHF, new diffuse hypokinesis with slight decrease in EF by 2013 echo and no known recent cardiology follow-up.  Recommend preoperative cardiology evaluation. Judeth CornfieldStephanie, RN at [facility] notified.  Paula Ochsllison Magic Mohler, PA-C [facility]/Anesthesiology Phone [phone] 06/30/2014 12:05 PM  Addendum:  For now, Dr. Darrick PennaFields is keeping case on the OR schedule.  He has ordered an echo that is being done now.  Hopefully results will be available by her arrival tomorrow.  Consideration of cardiology consult tomorrow based on echo results and patient evaluation.  Dr. Sampson GoonFitzgerald is aware.  Paula Ochsllison Hulen Mandler, PA-C [facility]Anesthesiology Phone [phone] 06/30/2014 4:26 PM

## 2014-07-01 ENCOUNTER — Encounter (HOSPITAL_COMMUNITY): Admission: RE | Payer: Self-pay | Source: Ambulatory Visit

## 2014-07-01 ENCOUNTER — Other Ambulatory Visit (HOSPITAL_COMMUNITY): Payer: Self-pay | Admitting: Cardiology

## 2014-07-01 ENCOUNTER — Inpatient Hospital Stay (HOSPITAL_COMMUNITY): Admission: RE | Admit: 2014-07-01 | Payer: Medicare Other | Source: Ambulatory Visit | Admitting: Vascular Surgery

## 2014-07-01 DIAGNOSIS — R079 Chest pain, unspecified: Secondary | ICD-10-CM

## 2014-07-01 SURGERY — AMPUTATION, ABOVE KNEE
Anesthesia: General | Laterality: Left

## 2014-07-01 NOTE — Progress Notes (Signed)
  Echocardiogram 2D Echocardiogram has been performed.  Leta JunglingCooper, Kimmi Acocella M 07/01/2014, 12:45 PM

## 2014-07-02 ENCOUNTER — Other Ambulatory Visit: Payer: Self-pay

## 2014-07-02 ENCOUNTER — Encounter (HOSPITAL_COMMUNITY): Payer: Self-pay | Admitting: Pharmacy Technician

## 2014-07-03 ENCOUNTER — Other Ambulatory Visit: Payer: Self-pay

## 2014-07-03 ENCOUNTER — Encounter (HOSPITAL_COMMUNITY)
Admission: RE | Admit: 2014-07-03 | Discharge: 2014-07-03 | Disposition: A | Discharge status: Home / Self Care | Payer: Medicare Other | Source: Ambulatory Visit | Attending: Cardiology | Admitting: Cardiology

## 2014-07-03 ENCOUNTER — Other Ambulatory Visit (HOSPITAL_COMMUNITY): Payer: Self-pay | Admitting: Cardiology

## 2014-07-03 ENCOUNTER — Ambulatory Visit (HOSPITAL_COMMUNITY)
Admission: RE | Admit: 2014-07-03 | Discharge: 2014-07-03 | Disposition: A | Discharge status: Home / Self Care | Payer: Medicare Other | Source: Ambulatory Visit | Attending: Cardiology | Admitting: Cardiology

## 2014-07-03 DIAGNOSIS — R079 Chest pain, unspecified: Secondary | ICD-10-CM

## 2014-07-03 DIAGNOSIS — I2510 Atherosclerotic heart disease of native coronary artery without angina pectoris: Secondary | ICD-10-CM | POA: Insufficient documentation

## 2014-07-03 DIAGNOSIS — N186 End stage renal disease: Secondary | ICD-10-CM | POA: Insufficient documentation

## 2014-07-03 LAB — HEPATIC FUNCTION PANEL
ALT: 12 U/L (ref 0–35)
AST: 17 U/L (ref 0–37)
Albumin: 3.5 g/dL (ref 3.5–5.2)
Alkaline Phosphatase: 109 U/L (ref 39–117)
TOTAL PROTEIN: 7.7 g/dL (ref 6.0–8.3)
Total Bilirubin: 0.4 mg/dL (ref 0.3–1.2)

## 2014-07-03 LAB — BASIC METABOLIC PANEL
ANION GAP: 23 — AB (ref 5–15)
BUN: 39 mg/dL — ABNORMAL HIGH (ref 6–23)
CO2: 28 meq/L (ref 19–32)
CREATININE: 6.04 mg/dL — AB (ref 0.50–1.10)
Calcium: 10.5 mg/dL (ref 8.4–10.5)
Chloride: 90 mEq/L — ABNORMAL LOW (ref 96–112)
GFR calc non Af Amer: 7 mL/min — ABNORMAL LOW (ref >90)
GFR, EST AFRICAN AMERICAN: 8 mL/min — AB (ref >90)
Glucose, Bld: 80 mg/dL (ref 70–99)
Potassium: 3.8 mEq/L (ref 3.7–5.3)
SODIUM: 141 meq/L (ref 137–147)

## 2014-07-03 LAB — LIPID PANEL
CHOL/HDL RATIO: 7.1 ratio
Cholesterol: 221 mg/dL — ABNORMAL HIGH (ref 0–200)
HDL: 31 mg/dL — AB (ref >39)
LDL Cholesterol: 151 mg/dL — ABNORMAL HIGH (ref 0–99)
TRIGLYCERIDES: 193 mg/dL — AB (ref <150)
VLDL: 39 mg/dL (ref 0–40)

## 2014-07-03 LAB — HEMOGLOBIN A1C
HEMOGLOBIN A1C: 8.1 % — AB (ref <5.7)
MEAN PLASMA GLUCOSE: 186 mg/dL — AB (ref <117)

## 2014-07-03 MED ORDER — REGADENOSON 0.4 MG/5ML IV SOLN
0.4000 mg | Freq: Once | INTRAVENOUS | Status: AC
  Administered 2014-07-03: 0.4 mg via INTRAVENOUS

## 2014-07-03 MED ORDER — TECHNETIUM TC 99M SESTAMIBI GENERIC - CARDIOLITE
10.0000 | Freq: Once | INTRAVENOUS | Status: AC | PRN
  Administered 2014-07-03: 10 via INTRAVENOUS

## 2014-07-03 MED ORDER — LIDOCAINE HCL 1 % IJ SOLN
INTRAMUSCULAR | Status: AC
  Filled 2014-07-03: qty 20

## 2014-07-03 MED ORDER — TECHNETIUM TC 99M SESTAMIBI GENERIC - CARDIOLITE
30.0000 | Freq: Once | INTRAVENOUS | Status: AC | PRN
  Administered 2014-07-03: 30 via INTRAVENOUS

## 2014-07-03 MED ORDER — REGADENOSON 0.4 MG/5ML IV SOLN
INTRAVENOUS | Status: AC
  Filled 2014-07-03: qty 5

## 2014-07-03 NOTE — Progress Notes (Signed)
Called pt to give her date and time of arrival for her surgery. Spoke with pt's daughter, Michel Bickersywanda. Informed her that date of surgery is Monday, 07/06/14 and time of arrival is 5:30 AM for a 7:30 AM surgery. She voiced understanding. Pt had a PAT appt on 06/29/14 and received pre-op instructions then.

## 2014-07-03 NOTE — Progress Notes (Signed)
Anesthesia follow-up:  See my note from 06/30/14. Patient was canceled following an abnormal echo (see below). This afternoon, I was notified that she was placed back on the OR schedule for 07/06/14 0730; however, this was pending cardiac clearance from Dr. Sharyn LullHarwani.  He ordered a Nuclear stress test which was done today.  07/03/14 results showed: 1. No reversible ischemia or infarction. 2. Diffuse hypokinesis of the LEFT ventricular wall. 3. Left ventricular ejection fraction 38%, low. 4. Intermediate-risk stress test findings*.   Echo 06/30/14:  Study Conclusions - Left ventricle: The cavity size was normal. Wall thickness was increased in a pattern of severe LVH. Systolic function was severely reduced. The estimated ejection fraction was in the range of 20% to 25%. Diffuse hypokinesis. Features are consistent with a pseudonormal left ventricular filling pattern, with concomitant abnormal relaxation and increased filling pressure (grade 2 diastolic dysfunction). Doppler parameters are consistent with high ventricular filling pressure. - Mitral valve: There was severe regurgitation. - Left atrium: The atrium was moderately dilated. - Right ventricle: Systolic function was mildly reduced. - Tricuspid valve: There was moderate regurgitation. - Pulmonary arteries: Systolic pressure was severely increased. PA peak pressure: 67 mm Hg (S). Impressions: Severe global reduction in LV function; severe LVH; grade 2 diastolic dysfunction; moderate LAE; restricted posterior MV leaflet with severe MR; mildly reduced RV function; moderate TR with severely elevated pulmonary pressures; myocardium with speckled appearance; consider amyloid. Compared to 10/10/11, LV function is worse, MR is now severe and there is now severe pulmonary hypertension.  I called [facility] at 4:30PM regarding clearance status and spoke with Purnell ShoemakerKaye, RN. I was told that Judeth CornfieldStephanie, RN was talking with Dr. Sharyn LullHarwani and would call me back.  I near heard back  from her and only got voice message at 5:10PM.  If clearance status is still unknown by her surgery time, then it will have to be addressed at that time.  Currently, this is on the only case I see posted for Dr. Darrick PennaFields that day.   Velna Ochsllison Reveca Desmarais, PA-C [facility]Anesthesiology Phone [phone] 07/03/2014 5:38 PM

## 2014-07-03 NOTE — Procedures (Signed)
Successful RUE PERIPHERAL IV START for access during nuc medicine exam No comp Stable Ready for use

## 2014-07-05 MED ORDER — CHLORHEXIDINE GLUCONATE 4 % EX LIQD
60.0000 mL | Freq: Once | CUTANEOUS | Status: DC
  Filled 2014-07-05: qty 60

## 2014-07-06 ENCOUNTER — Encounter (HOSPITAL_COMMUNITY): Payer: Self-pay | Admitting: Anesthesiology

## 2014-07-06 ENCOUNTER — Inpatient Hospital Stay (HOSPITAL_COMMUNITY): Payer: Medicare Other | Admitting: Anesthesiology

## 2014-07-06 ENCOUNTER — Encounter (HOSPITAL_COMMUNITY)
Admission: RE | Disposition: A | Discharge status: Home / Self Care | Payer: Self-pay | Source: Ambulatory Visit | Attending: Vascular Surgery

## 2014-07-06 ENCOUNTER — Telehealth: Payer: Self-pay | Admitting: Vascular Surgery

## 2014-07-06 ENCOUNTER — Inpatient Hospital Stay (HOSPITAL_COMMUNITY): Payer: Medicare Other | Admitting: Vascular Surgery

## 2014-07-06 ENCOUNTER — Inpatient Hospital Stay (HOSPITAL_COMMUNITY)
Admission: RE | Admit: 2014-07-06 | Discharge: 2014-07-10 | DRG: 474 | Disposition: A | Discharge status: Home / Self Care | Payer: Medicare Other | Source: Ambulatory Visit | Attending: Vascular Surgery | Admitting: Vascular Surgery

## 2014-07-06 DIAGNOSIS — Z992 Dependence on renal dialysis: Secondary | ICD-10-CM

## 2014-07-06 DIAGNOSIS — D573 Sickle-cell trait: Secondary | ICD-10-CM | POA: Diagnosis present

## 2014-07-06 DIAGNOSIS — T8789 Other complications of amputation stump: Secondary | ICD-10-CM | POA: Diagnosis present

## 2014-07-06 DIAGNOSIS — Y92009 Unspecified place in unspecified non-institutional (private) residence as the place of occurrence of the external cause: Secondary | ICD-10-CM

## 2014-07-06 DIAGNOSIS — L97829 Non-pressure chronic ulcer of other part of left lower leg with unspecified severity: Secondary | ICD-10-CM | POA: Diagnosis present

## 2014-07-06 DIAGNOSIS — Z89611 Acquired absence of right leg above knee: Secondary | ICD-10-CM | POA: Diagnosis not present

## 2014-07-06 DIAGNOSIS — Z89512 Acquired absence of left leg below knee: Secondary | ICD-10-CM | POA: Diagnosis not present

## 2014-07-06 DIAGNOSIS — Z86718 Personal history of other venous thrombosis and embolism: Secondary | ICD-10-CM | POA: Diagnosis not present

## 2014-07-06 DIAGNOSIS — I5022 Chronic systolic (congestive) heart failure: Secondary | ICD-10-CM | POA: Diagnosis present

## 2014-07-06 DIAGNOSIS — N2581 Secondary hyperparathyroidism of renal origin: Secondary | ICD-10-CM | POA: Diagnosis present

## 2014-07-06 DIAGNOSIS — Y835 Amputation of limb(s) as the cause of abnormal reaction of the patient, or of later complication, without mention of misadventure at the time of the procedure: Secondary | ICD-10-CM | POA: Diagnosis present

## 2014-07-06 DIAGNOSIS — Z794 Long term (current) use of insulin: Secondary | ICD-10-CM | POA: Diagnosis not present

## 2014-07-06 DIAGNOSIS — I70241 Atherosclerosis of native arteries of left leg with ulceration of thigh: Secondary | ICD-10-CM | POA: Diagnosis present

## 2014-07-06 DIAGNOSIS — I120 Hypertensive chronic kidney disease with stage 5 chronic kidney disease or end stage renal disease: Secondary | ICD-10-CM | POA: Diagnosis present

## 2014-07-06 DIAGNOSIS — N186 End stage renal disease: Secondary | ICD-10-CM | POA: Diagnosis present

## 2014-07-06 DIAGNOSIS — E780 Pure hypercholesterolemia: Secondary | ICD-10-CM | POA: Diagnosis present

## 2014-07-06 DIAGNOSIS — D649 Anemia, unspecified: Secondary | ICD-10-CM | POA: Diagnosis present

## 2014-07-06 DIAGNOSIS — E119 Type 2 diabetes mellitus without complications: Secondary | ICD-10-CM | POA: Diagnosis present

## 2014-07-06 DIAGNOSIS — I70269 Atherosclerosis of native arteries of extremities with gangrene, unspecified extremity: Secondary | ICD-10-CM

## 2014-07-06 HISTORY — PX: AMPUTATION: SHX166

## 2014-07-06 LAB — CBC
HEMATOCRIT: 36 % (ref 36.0–46.0)
HEMOGLOBIN: 11.7 g/dL — AB (ref 12.0–15.0)
MCH: 29.5 pg (ref 26.0–34.0)
MCHC: 32.5 g/dL (ref 30.0–36.0)
MCV: 90.7 fL (ref 78.0–100.0)
Platelets: 217 10*3/uL (ref 150–400)
RBC: 3.97 MIL/uL (ref 3.87–5.11)
RDW: 16.4 % — ABNORMAL HIGH (ref 11.5–15.5)
WBC: 6.4 10*3/uL (ref 4.0–10.5)

## 2014-07-06 LAB — GLUCOSE, CAPILLARY
GLUCOSE-CAPILLARY: 173 mg/dL — AB (ref 70–99)
GLUCOSE-CAPILLARY: 167 mg/dL — AB (ref 70–99)
GLUCOSE-CAPILLARY: 63 mg/dL — AB (ref 70–99)
Glucose-Capillary: 189 mg/dL — ABNORMAL HIGH (ref 70–99)
Glucose-Capillary: 106 mg/dL — ABNORMAL HIGH (ref 70–99)

## 2014-07-06 LAB — CREATININE, SERUM
Creatinine, Ser: 8.04 mg/dL — ABNORMAL HIGH (ref 0.50–1.10)
GFR calc non Af Amer: 5 mL/min — ABNORMAL LOW (ref >90)
GFR, EST AFRICAN AMERICAN: 6 mL/min — AB (ref >90)

## 2014-07-06 LAB — POCT I-STAT 4, (NA,K, GLUC, HGB,HCT)
GLUCOSE: 185 mg/dL — AB (ref 70–99)
HCT: 37 % (ref 36.0–46.0)
Hemoglobin: 12.6 g/dL (ref 12.0–15.0)
POTASSIUM: 4.7 meq/L (ref 3.7–5.3)
SODIUM: 133 meq/L — AB (ref 137–147)

## 2014-07-06 SURGERY — AMPUTATION, ABOVE KNEE
Anesthesia: General | Site: Leg Upper | Laterality: Left

## 2014-07-06 MED ORDER — SODIUM CHLORIDE 0.9 % IV SOLN: 250.0000 mL | INTRAVENOUS | Status: DC | PRN

## 2014-07-06 MED ORDER — DOXERCALCIFEROL 4 MCG/2ML IV SOLN
4.0000 ug | INTRAVENOUS | Status: DC
  Administered 2014-07-07 – 2014-07-09 (×2): 4 ug via INTRAVENOUS
  Filled 2014-07-06 (×2): qty 2

## 2014-07-06 MED ORDER — SODIUM CHLORIDE 0.9 % IJ SOLN
INTRAMUSCULAR | Status: AC
  Filled 2014-07-06: qty 10

## 2014-07-06 MED ORDER — NEPRO/CARBSTEADY PO LIQD
237.0000 mL | ORAL | Status: DC | PRN
Start: 2014-07-06 — End: 2014-07-06

## 2014-07-06 MED ORDER — ETOMIDATE 2 MG/ML IV SOLN
INTRAVENOUS | Status: AC
  Filled 2014-07-06: qty 10

## 2014-07-06 MED ORDER — ONDANSETRON HCL 4 MG/2ML IJ SOLN
INTRAMUSCULAR | Status: DC | PRN
  Administered 2014-07-06: 4 mg via INTRAVENOUS

## 2014-07-06 MED ORDER — ROCURONIUM BROMIDE 50 MG/5ML IV SOLN
INTRAVENOUS | Status: AC
  Filled 2014-07-06: qty 1

## 2014-07-06 MED ORDER — LIDOCAINE HCL (CARDIAC) 20 MG/ML IV SOLN
INTRAVENOUS | Status: DC | PRN
  Administered 2014-07-06: 50 mg via INTRAVENOUS

## 2014-07-06 MED ORDER — OXYCODONE HCL 5 MG/5ML PO SOLN: 5.0000 mg | Freq: Once | ORAL | Status: DC | PRN

## 2014-07-06 MED ORDER — FENTANYL CITRATE 0.05 MG/ML IJ SOLN
INTRAMUSCULAR | Status: AC
  Filled 2014-07-06: qty 5

## 2014-07-06 MED ORDER — PANTOPRAZOLE SODIUM 40 MG PO TBEC
40.0000 mg | DELAYED_RELEASE_TABLET | Freq: Every day | ORAL | Status: DC
  Administered 2014-07-07 – 2014-07-09 (×3): 40 mg via ORAL
  Filled 2014-07-06: qty 1

## 2014-07-06 MED ORDER — HEPARIN SODIUM (PORCINE) 1000 UNIT/ML DIALYSIS
1000.0000 [IU] | INTRAMUSCULAR | Status: DC | PRN
  Filled 2014-07-06: qty 1

## 2014-07-06 MED ORDER — PROPOFOL 10 MG/ML IV BOLUS
INTRAVENOUS | Status: AC
  Filled 2014-07-06: qty 20

## 2014-07-06 MED ORDER — LIDOCAINE HCL (PF) 1 % IJ SOLN: 5.0000 mL | INTRAMUSCULAR | Status: DC | PRN

## 2014-07-06 MED ORDER — LIDOCAINE-PRILOCAINE 2.5-2.5 % EX CREA
1.0000 "application " | TOPICAL_CREAM | CUTANEOUS | Status: DC | PRN
  Filled 2014-07-06: qty 5

## 2014-07-06 MED ORDER — HYDROMORPHONE HCL 1 MG/ML IJ SOLN
0.2500 mg | INTRAMUSCULAR | Status: DC | PRN
  Administered 2014-07-06 (×3): 0.25 mg via INTRAVENOUS

## 2014-07-06 MED ORDER — MIDAZOLAM HCL 2 MG/2ML IJ SOLN
INTRAMUSCULAR | Status: AC
  Filled 2014-07-06: qty 2

## 2014-07-06 MED ORDER — OXYCODONE HCL 5 MG PO TABS: 5.0000 mg | ORAL_TABLET | Freq: Once | ORAL | Status: DC | PRN

## 2014-07-06 MED ORDER — SEVELAMER CARBONATE 800 MG PO TABS
1600.0000 mg | ORAL_TABLET | Freq: Three times a day (TID) | ORAL | Status: DC
  Filled 2014-07-06 (×6): qty 2

## 2014-07-06 MED ORDER — CARVEDILOL 25 MG PO TABS
25.0000 mg | ORAL_TABLET | Freq: Two times a day (BID) | ORAL | Status: DC
  Administered 2014-07-07: 25 mg via ORAL
  Filled 2014-07-06 (×8): qty 1

## 2014-07-06 MED ORDER — SODIUM CHLORIDE 0.9 % IV SOLN: 100.0000 mL | INTRAVENOUS | Status: DC | PRN

## 2014-07-06 MED ORDER — HYDRALAZINE HCL 20 MG/ML IJ SOLN: 5.0000 mg | INTRAMUSCULAR | Status: DC | PRN

## 2014-07-06 MED ORDER — ENOXAPARIN SODIUM 30 MG/0.3ML ~~LOC~~ SOLN
30.0000 mg | SUBCUTANEOUS | Status: DC
  Administered 2014-07-07 – 2014-07-09 (×3): 30 mg via SUBCUTANEOUS
  Filled 2014-07-06 (×5): qty 0.3

## 2014-07-06 MED ORDER — ONDANSETRON HCL 4 MG/2ML IJ SOLN
INTRAMUSCULAR | Status: AC
  Filled 2014-07-06: qty 2

## 2014-07-06 MED ORDER — PHENOL 1.4 % MT LIQD
1.0000 | OROMUCOSAL | Status: DC | PRN
Start: 2014-07-06 — End: 2014-07-10
  Filled 2014-07-06: qty 177

## 2014-07-06 MED ORDER — ONDANSETRON HCL 4 MG/2ML IJ SOLN
4.0000 mg | Freq: Four times a day (QID) | INTRAMUSCULAR | Status: DC | PRN
  Administered 2014-07-06 – 2014-07-08 (×2): 4 mg via INTRAVENOUS
  Filled 2014-07-06 (×3): qty 2

## 2014-07-06 MED ORDER — ACETAMINOPHEN 325 MG PO TABS: 325.0000 mg | ORAL_TABLET | ORAL | Status: DC | PRN

## 2014-07-06 MED ORDER — ARTIFICIAL TEARS OP OINT
TOPICAL_OINTMENT | OPHTHALMIC | Status: AC
  Filled 2014-07-06: qty 3.5

## 2014-07-06 MED ORDER — GLYCOPYRROLATE 0.2 MG/ML IJ SOLN
INTRAMUSCULAR | Status: DC | PRN
  Administered 2014-07-06: .6 mg via INTRAVENOUS

## 2014-07-06 MED ORDER — DEXTROSE 5 % IV SOLN: 1.5000 g | Freq: Two times a day (BID) | INTRAVENOUS | Status: DC

## 2014-07-06 MED ORDER — OXYCODONE-ACETAMINOPHEN 5-325 MG PO TABS
ORAL_TABLET | ORAL | Status: AC
  Filled 2014-07-06: qty 1

## 2014-07-06 MED ORDER — CEFAZOLIN SODIUM-DEXTROSE 2-3 GM-% IV SOLR
INTRAVENOUS | Status: AC
  Filled 2014-07-06: qty 50

## 2014-07-06 MED ORDER — SODIUM CHLORIDE 0.9 % IJ SOLN: 3.0000 mL | INTRAMUSCULAR | Status: DC | PRN

## 2014-07-06 MED ORDER — DARBEPOETIN ALFA 40 MCG/0.4ML IJ SOSY
40.0000 ug | PREFILLED_SYRINGE | INTRAMUSCULAR | Status: DC
  Administered 2014-07-07: 40 ug via INTRAVENOUS
  Filled 2014-07-06: qty 0.4

## 2014-07-06 MED ORDER — PHENYLEPHRINE HCL 10 MG/ML IJ SOLN
INTRAMUSCULAR | Status: DC | PRN
  Administered 2014-07-06 (×2): 40 ug via INTRAVENOUS

## 2014-07-06 MED ORDER — ROCURONIUM BROMIDE 100 MG/10ML IV SOLN
INTRAVENOUS | Status: DC | PRN
  Administered 2014-07-06: 30 mg via INTRAVENOUS

## 2014-07-06 MED ORDER — LIDOCAINE-PRILOCAINE 2.5-2.5 % EX CREA: 1.0000 "application " | TOPICAL_CREAM | CUTANEOUS | Status: DC | PRN

## 2014-07-06 MED ORDER — HYDROMORPHONE HCL 1 MG/ML IJ SOLN
INTRAMUSCULAR | Status: AC
  Filled 2014-07-06: qty 1

## 2014-07-06 MED ORDER — SODIUM CHLORIDE 0.9 % IJ SOLN
3.0000 mL | Freq: Two times a day (BID) | INTRAMUSCULAR | Status: DC
  Administered 2014-07-07 – 2014-07-09 (×7): 3 mL via INTRAVENOUS

## 2014-07-06 MED ORDER — SUCCINYLCHOLINE CHLORIDE 20 MG/ML IJ SOLN
INTRAMUSCULAR | Status: AC
  Filled 2014-07-06: qty 1

## 2014-07-06 MED ORDER — DOCUSATE SODIUM 100 MG PO CAPS
100.0000 mg | ORAL_CAPSULE | Freq: Every day | ORAL | Status: DC
  Administered 2014-07-07 – 2014-07-09 (×3): 100 mg via ORAL
  Filled 2014-07-06 (×4): qty 1

## 2014-07-06 MED ORDER — EPHEDRINE SULFATE 50 MG/ML IJ SOLN
INTRAMUSCULAR | Status: AC
  Filled 2014-07-06: qty 1

## 2014-07-06 MED ORDER — INSULIN GLARGINE 100 UNIT/ML ~~LOC~~ SOLN
16.0000 [IU] | Freq: Every day | SUBCUTANEOUS | Status: DC
  Administered 2014-07-07 (×2): 16 [IU] via SUBCUTANEOUS
  Filled 2014-07-06 (×5): qty 0.16

## 2014-07-06 MED ORDER — 0.9 % SODIUM CHLORIDE (POUR BTL) OPTIME
TOPICAL | Status: DC | PRN
  Administered 2014-07-06: 1000 mL

## 2014-07-06 MED ORDER — MORPHINE SULFATE 2 MG/ML IJ SOLN
INTRAMUSCULAR | Status: AC
  Filled 2014-07-06: qty 1

## 2014-07-06 MED ORDER — SODIUM CHLORIDE 0.9 % IV SOLN
100.0000 mL | INTRAVENOUS | Status: DC | PRN
Start: 2014-07-06 — End: 2014-07-06

## 2014-07-06 MED ORDER — HYDROCODONE-ACETAMINOPHEN 5-325 MG PO TABS
1.0000 | ORAL_TABLET | Freq: Four times a day (QID) | ORAL | Status: DC | PRN
Start: 2014-07-06 — End: 2014-07-08
  Administered 2014-07-06: 1 via ORAL
  Filled 2014-07-06: qty 1

## 2014-07-06 MED ORDER — MORPHINE SULFATE 2 MG/ML IJ SOLN
2.0000 mg | INTRAMUSCULAR | Status: DC | PRN
  Administered 2014-07-06 – 2014-07-07 (×3): 2 mg via INTRAVENOUS
  Administered 2014-07-07: 3 mg via INTRAVENOUS
  Administered 2014-07-07: 2 mg via INTRAVENOUS
  Administered 2014-07-07: 3 mg via INTRAVENOUS
  Administered 2014-07-07 (×5): 2 mg via INTRAVENOUS
  Administered 2014-07-08: 3 mg via INTRAVENOUS
  Administered 2014-07-08: 2 mg via INTRAVENOUS
  Administered 2014-07-08 (×2): 3 mg via INTRAVENOUS
  Administered 2014-07-09: 2 mg via INTRAVENOUS
  Filled 2014-07-06: qty 2
  Filled 2014-07-06: qty 1
  Filled 2014-07-06 (×3): qty 2
  Filled 2014-07-06 (×4): qty 1
  Filled 2014-07-06 (×2): qty 2
  Filled 2014-07-06 (×2): qty 1
  Filled 2014-07-06: qty 2

## 2014-07-06 MED ORDER — CEFAZOLIN SODIUM-DEXTROSE 2-3 GM-% IV SOLR
INTRAVENOUS | Status: DC | PRN
  Administered 2014-07-06: 2 g via INTRAVENOUS

## 2014-07-06 MED ORDER — MIDAZOLAM HCL 5 MG/5ML IJ SOLN
INTRAMUSCULAR | Status: DC | PRN
  Administered 2014-07-06: 2 mg via INTRAVENOUS

## 2014-07-06 MED ORDER — PENTAFLUOROPROP-TETRAFLUOROETH EX AERO: 1.0000 "application " | INHALATION_SPRAY | CUTANEOUS | Status: DC | PRN

## 2014-07-06 MED ORDER — NEOSTIGMINE METHYLSULFATE 10 MG/10ML IV SOLN
INTRAVENOUS | Status: DC | PRN
  Administered 2014-07-06: 4 mg via INTRAVENOUS

## 2014-07-06 MED ORDER — RENA-VITE PO TABS
1.0000 | ORAL_TABLET | Freq: Every day | ORAL | Status: DC
  Administered 2014-07-07 – 2014-07-09 (×2): 1 via ORAL
  Filled 2014-07-06 (×5): qty 1

## 2014-07-06 MED ORDER — SODIUM CHLORIDE 0.9 % IV SOLN
INTRAVENOUS | Status: DC | PRN
  Administered 2014-07-06: 07:00:00 via INTRAVENOUS

## 2014-07-06 MED ORDER — METOPROLOL TARTRATE 1 MG/ML IV SOLN
2.0000 mg | INTRAVENOUS | Status: DC | PRN
  Filled 2014-07-06: qty 5

## 2014-07-06 MED ORDER — MEPERIDINE HCL 25 MG/ML IJ SOLN: 6.2500 mg | INTRAMUSCULAR | Status: DC | PRN

## 2014-07-06 MED ORDER — HEPARIN SODIUM (PORCINE) 1000 UNIT/ML DIALYSIS: 1000.0000 [IU] | INTRAMUSCULAR | Status: DC | PRN

## 2014-07-06 MED ORDER — SEVELAMER CARBONATE 800 MG PO TABS
800.0000 mg | ORAL_TABLET | ORAL | Status: DC | PRN
  Filled 2014-07-06: qty 1

## 2014-07-06 MED ORDER — ALTEPLASE 2 MG IJ SOLR: 2.0000 mg | Freq: Once | INTRAMUSCULAR | Status: DC | PRN

## 2014-07-06 MED ORDER — IRBESARTAN 300 MG PO TABS
300.0000 mg | ORAL_TABLET | Freq: Every day | ORAL | Status: DC
  Administered 2014-07-07: 300 mg via ORAL
  Filled 2014-07-06 (×4): qty 1

## 2014-07-06 MED ORDER — GLYCOPYRROLATE 0.2 MG/ML IJ SOLN
INTRAMUSCULAR | Status: AC
Start: 2014-07-06 — End: 2014-07-06
  Filled 2014-07-06: qty 4

## 2014-07-06 MED ORDER — SEVELAMER CARBONATE 800 MG PO TABS
1600.0000 mg | ORAL_TABLET | Freq: Three times a day (TID) | ORAL | Status: DC
  Filled 2014-07-06 (×15): qty 2

## 2014-07-06 MED ORDER — ALTEPLASE 2 MG IJ SOLR
2.0000 mg | Freq: Once | INTRAMUSCULAR | Status: AC | PRN
  Filled 2014-07-06: qty 2

## 2014-07-06 MED ORDER — NEOSTIGMINE METHYLSULFATE 10 MG/10ML IV SOLN
INTRAVENOUS | Status: AC
  Filled 2014-07-06: qty 1

## 2014-07-06 MED ORDER — PENTAFLUOROPROP-TETRAFLUOROETH EX AERO
1.0000 | INHALATION_SPRAY | CUTANEOUS | Status: DC | PRN
Start: 2014-07-06 — End: 2014-07-06

## 2014-07-06 MED ORDER — ACETAMINOPHEN 650 MG RE SUPP: 325.0000 mg | RECTAL | Status: DC | PRN

## 2014-07-06 MED ORDER — NEPRO/CARBSTEADY PO LIQD
237.0000 mL | ORAL | Status: DC | PRN
  Filled 2014-07-06: qty 237

## 2014-07-06 MED ORDER — EPHEDRINE SULFATE 50 MG/ML IJ SOLN
INTRAMUSCULAR | Status: DC | PRN
  Administered 2014-07-06 (×3): 10 mg via INTRAVENOUS

## 2014-07-06 MED ORDER — LABETALOL HCL 5 MG/ML IV SOLN
10.0000 mg | INTRAVENOUS | Status: DC | PRN
  Filled 2014-07-06: qty 4

## 2014-07-06 MED ORDER — GUAIFENESIN-DM 100-10 MG/5ML PO SYRP
15.0000 mL | ORAL_SOLUTION | ORAL | Status: DC | PRN
  Filled 2014-07-06: qty 15

## 2014-07-06 MED ORDER — FENTANYL CITRATE 0.05 MG/ML IJ SOLN
INTRAMUSCULAR | Status: DC | PRN
  Administered 2014-07-06: 100 ug via INTRAVENOUS

## 2014-07-06 MED ORDER — LIDOCAINE HCL (CARDIAC) 20 MG/ML IV SOLN
INTRAVENOUS | Status: AC
  Filled 2014-07-06: qty 5

## 2014-07-06 MED ORDER — ETOMIDATE 2 MG/ML IV SOLN
INTRAVENOUS | Status: DC | PRN
  Administered 2014-07-06: 14 mg via INTRAVENOUS

## 2014-07-06 MED ORDER — ONDANSETRON HCL 4 MG/2ML IJ SOLN: 4.0000 mg | Freq: Once | INTRAMUSCULAR | Status: DC | PRN

## 2014-07-06 MED ORDER — HYDROCODONE-ACETAMINOPHEN 5-325 MG PO TABS
ORAL_TABLET | ORAL | Status: AC
  Filled 2014-07-06: qty 1

## 2014-07-06 MED ORDER — DEXTROSE 5 % IV SOLN
1.5000 g | INTRAVENOUS | Status: AC
  Administered 2014-07-07: 1.5 g via INTRAVENOUS
  Filled 2014-07-06: qty 1.5

## 2014-07-06 SURGICAL SUPPLY — 40 items
BANDAGE ELASTIC 6 VELCRO ST LF (GAUZE/BANDAGES/DRESSINGS) ×2 IMPLANT
BLADE SAW RECIP 87.9 MT (BLADE) IMPLANT
BNDG COHESIVE 4X5 TAN STRL (GAUZE/BANDAGES/DRESSINGS) IMPLANT
BNDG COHESIVE 6X5 TAN STRL LF (GAUZE/BANDAGES/DRESSINGS) ×2 IMPLANT
BNDG GAUZE ELAST 4 BULKY (GAUZE/BANDAGES/DRESSINGS) ×2 IMPLANT
CANISTER SUCTION 2500CC (MISCELLANEOUS) ×2 IMPLANT
CLIP TI MEDIUM 6 (CLIP) IMPLANT
COVER SURGICAL LIGHT HANDLE (MISCELLANEOUS) ×2 IMPLANT
COVER TABLE BACK 60X90 (DRAPES) ×2 IMPLANT
DRAIN CHANNEL 19F RND (DRAIN) IMPLANT
DRAPE ORTHO SPLIT 77X108 STRL (DRAPES) ×2
DRAPE PROXIMA HALF (DRAPES) ×2 IMPLANT
DRAPE SURG ORHT 6 SPLT 77X108 (DRAPES) ×2 IMPLANT
DRSG ADAPTIC 3X8 NADH LF (GAUZE/BANDAGES/DRESSINGS) ×2 IMPLANT
ELECT REM PT RETURN 9FT ADLT (ELECTROSURGICAL) ×2
ELECTRODE REM PT RTRN 9FT ADLT (ELECTROSURGICAL) ×1 IMPLANT
EVACUATOR SILICONE 100CC (DRAIN) IMPLANT
GAUZE SPONGE 4X4 12PLY STRL (GAUZE/BANDAGES/DRESSINGS) IMPLANT
GLOVE BIO SURGEON STRL SZ7.5 (GLOVE) ×2 IMPLANT
GOWN STRL REUS W/ TWL LRG LVL3 (GOWN DISPOSABLE) ×2 IMPLANT
GOWN STRL REUS W/TWL LRG LVL3 (GOWN DISPOSABLE) ×2
KIT BASIN OR (CUSTOM PROCEDURE TRAY) ×2 IMPLANT
KIT ROOM TURNOVER OR (KITS) ×2 IMPLANT
NS IRRIG 1000ML POUR BTL (IV SOLUTION) ×2 IMPLANT
PACK GENERAL/GYN (CUSTOM PROCEDURE TRAY) ×2 IMPLANT
PAD ARMBOARD 7.5X6 YLW CONV (MISCELLANEOUS) ×2 IMPLANT
SPONGE GAUZE 4X4 12PLY STER LF (GAUZE/BANDAGES/DRESSINGS) ×2 IMPLANT
STAPLER VISISTAT 35W (STAPLE) IMPLANT
STOCKINETTE IMPERVIOUS LG (DRAPES) ×2 IMPLANT
SUT ETHILON 3 0 PS 1 (SUTURE) IMPLANT
SUT SILK 2 0 SH (SUTURE) IMPLANT
SUT SILK 2 0 TIES 10X30 (SUTURE) ×2 IMPLANT
SUT VIC AB 2-0 CT1 18 (SUTURE) ×4 IMPLANT
SUT VIC AB 2-0 SH 18 (SUTURE) ×2 IMPLANT
SUT VIC AB 3-0 SH 27 (SUTURE) ×2
SUT VIC AB 3-0 SH 27X BRD (SUTURE) ×2 IMPLANT
TOWEL OR 17X24 6PK STRL BLUE (TOWEL DISPOSABLE) ×2 IMPLANT
TOWEL OR 17X26 10 PK STRL BLUE (TOWEL DISPOSABLE) ×2 IMPLANT
UNDERPAD 30X30 INCONTINENT (UNDERPADS AND DIAPERS) ×2 IMPLANT
WATER STERILE IRR 1000ML POUR (IV SOLUTION) ×2 IMPLANT

## 2014-07-06 NOTE — Anesthesia Procedure Notes (Signed)
Procedure Name: Intubation Date/Time: 07/06/2014 7:39 AM Performed by: Carmela RimaMARTINELLI, Sacheen Arrasmith F Pre-anesthesia Checklist: Patient being monitored, Suction available, Emergency Drugs available, Patient identified and Timeout performed Patient Re-evaluated:Patient Re-evaluated prior to inductionOxygen Delivery Method: Circle system utilized Preoxygenation: Pre-oxygenation with 100% oxygen Intubation Type: IV induction Ventilation: Mask ventilation without difficulty Laryngoscope Size: Mac and 3 Grade View: Grade I Tube type: Oral Tube size: 7.5 mm Number of attempts: 1 Placement Confirmation: positive ETCO2,  ETT inserted through vocal cords under direct vision and breath sounds checked- equal and bilateral Secured at: 22 cm Tube secured with: Tape Dental Injury: Teeth and Oropharynx as per pre-operative assessment

## 2014-07-06 NOTE — Transfer of Care (Signed)
Immediate Anesthesia Transfer of Care Note  Patient: [name]  Procedure(s) Performed: Procedure(s): Left AMPUTATION ABOVE KNEE (Left)  Patient Location: PACU  Anesthesia Type:General  Level of Consciousness: sedated  Airway & Oxygen Therapy: Patient Spontanous Breathing and Patient connected to face mask oxygen  Post-op Assessment: Report given to PACU RN and Post -op Vital signs reviewed and stable  Post vital signs: Reviewed and stable  Complications: No apparent anesthesia complications

## 2014-07-06 NOTE — Anesthesia Preprocedure Evaluation (Addendum)
Anesthesia Evaluation  Patient identified by MRN, date of birth, ID band Patient awake    Reviewed: Allergy & Precautions, H&P , NPO status , Patient's Chart, lab work & pertinent test results, reviewed documented beta blocker date and time   Airway Mallampati: I  TM Distance: >3 FB Neck ROM: full    Dental  (+) Teeth Intact, Dental Advidsory Given   Pulmonary shortness of breath, COPD         Cardiovascular hypertension, Pt. on medications + angina with exertion + CAD, + Peripheral Vascular Disease and +CHF + Valvular Problems/Murmurs MR     Neuro/Psych    GI/Hepatic   Endo/Other  diabetes, Type 2, Insulin Dependent  Renal/GU ESRFRenal disease     Musculoskeletal   Abdominal   Peds  Hematology  (+) anemia ,   Anesthesia Other Findings Patient stated natural teeth, dentures upper found on induction. - Left ventricle: The cavity size was normal. Wall thickness was increased in a pattern of severe LVH. Systolic function was severely reduced. The estimated ejection fraction was in the range of 20% to 25%. Diffuse hypokinesis. Features are consistent with a pseudonormal left ventricular filling pattern, with concomitant abnormal relaxation and increased filling pressure (grade 2 diastolic dysfunction). Doppler parameters are consistent with high ventricular filling pressure. - Mitral valve: There was severe regurgitation. - Left atrium: The atrium was moderately dilated. - Right ventricle: Systolic function was mildly reduced. - Tricuspid valve: There was moderate regurgitation. - Pulmonary arteries: Systolic pressure was severely increased. PA peak pressure: 67 mm Hg (S).   Reproductive/Obstetrics                         Anesthesia Physical Anesthesia Plan  ASA: III  Anesthesia Plan: General   Post-op Pain Management:    Induction: Intravenous  Airway Management  Planned: LMA  Additional Equipment:   Intra-op Plan:   Post-operative Plan: Extubation in OR  Informed Consent: I have reviewed the patients History and Physical, chart, labs and discussed the procedure including the risks, benefits and alternatives for the proposed anesthesia with the patient or authorized representative who has indicated his/her understanding and acceptance.   Dental Advisory Given  Plan Discussed with: CRNA and Schirm  Anesthesia Plan Comments:       Anesthesia Quick Evaluation

## 2014-07-06 NOTE — Anesthesia Postprocedure Evaluation (Signed)
Anesthesia Post Note  Patient: [name]  Procedure(s) Performed: Procedure(s) (LRB): Left AMPUTATION ABOVE KNEE (Left)  Anesthesia type: general  Patient location: PACU  Post pain: Pain level controlled  Post assessment: Patient's Cardiovascular Status Stable  Last Vitals:  Filed Vitals:   07/06/14 1736  BP: 188/90  Pulse: 70  Temp: 36.2 C  Resp: 21    Post vital signs: Reviewed and stable  Level of consciousness: sedated  Complications: No apparent anesthesia complications

## 2014-07-06 NOTE — H&P (Signed)
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 She developed an ulceration on her left below-knee amputation and some pain approximately one month ago. She was recently seen by our nurse practitioner in her aortoiliac duplex ordered. She returns today for followup. He still has some pain in the ulceration. They have not progressed significantly. They are dry. Both of her amputations were performed approximately 4 years ago. She has not had problems with them since that time. She is chronically on hemodialysis and dialyzes Monday Tuesday Thursday and Saturday due to problems with fluid overload. She has not had any episodes of congestive failure recently. Chronic medical problems include diabetes, elevated cholesterol, hypertension, coronary artery disease, congestive failure. These are all currently stable.  Review of systems: She denies chest pain. She denies shortness of breath.    Past Medical History   Diagnosis  Date   .  Hypertension     .  Anemia     .  Systolic CHF, acute on chronic     .  Coronary artery disease     .  Peripheral vascular disease     .  CHF (congestive heart failure)     .  Angina     .  Blood transfusion  ~2003; 08/12/2012       "when I started on dialysis; lower gi bleeding" (08/12/2012)   .  Hypercholesteremia     .  DVT of leg (deep venous thrombosis)  2011       "RLE" (08/12/2012)   .  Type II diabetes mellitus     .  Acute lower GI bleeding  08/12/2012   .  Heart murmur         some say yes , some say no   .  Shortness of breath  01/02/12       "@rest ; lying down; w/exertion"; no change 08/12/2012   .  COPD (chronic obstructive pulmonary disease)     .  [facility]         [facility] and [facility] a[facility]   .  Pneumonia         Dec 2013   .  Sickle cell trait          Past Surgical History   Procedure  Laterality  Date   .  Leg amputation below knee    06/2010        left   .  Above knee leg amputation    05/2010       right   .  Av fistula placement    ~ 2003       left upper arm   .  Abdominal hysterectomy    1990's   .  Arteriovenous graft placement    2011       left upper arm   .  Vascular surgery       .  Cardiac catheterization    ~ 2010   .  Cesarean section    1988   .  Esophagogastroduodenoscopy    08/12/2012       Procedure: ESOPHAGOGASTRODUODENOSCOPY (EGD);  Dyar: Vertell NovakJames L Edwards Jr., MD;  [facility];  Service: Endoscopy;  Laterality: N/A;   .  Colonoscopy    08/13/2012       Procedure: COLONOSCOPY;  Mounger: Vertell NovakJames L Edwards Jr., MD;  [facility]; Service: Endoscopy;  Laterality: N/A;   .  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  Insertion of dialysis catheter  N/A  03/19/2013       Procedure: INSERTION OF DIALYSIS CATHETER;  Morelos: Fransisco HertzBrian L Chen, MD;  [facility];  Service: Vascular;  Laterality: N/A;  Right Internal Jugular Placement   .  Revision of arteriovenous goretex graft  Left  03/19/2013       Procedure: REVISION OF ARTERIOVENOUS GORETEX GRAFT;  Witherspoon: Fransisco HertzBrian L Chen, MD;  [facility];  Service: Vascular;  Laterality: Left;  using 6mm x 20cm Gore-Tex Graft    History      Social History   .  Marital Status:  Married       Spouse Name:  N/A       Number of Children:  N/A   .  Years of Education:  N/A      Occupational History   .  Not on file.      Social History Main Topics   .  Smoking status:  Never Smoker    .  Smokeless tobacco:  Never Used   .  Alcohol Use:  No   .  Drug Use:  No   .  Sexual Activity:  Yes      Other Topics  Concern   .  Not on file      Social History Narrative   .  No narrative on file    Current Outpatient Prescriptions on File Prior to Visit   Medication   Sig  Dispense  Refill   .  amLODipine (NORVASC) 10 MG tablet  Take 10 mg by mouth at bedtime.          Marland Kitchen.  BENICAR 40 MG tablet  Take 1 tablet by mouth daily.         .  cinacalcet (SENSIPAR) 60 MG tablet  Take 60 mg by mouth daily.         .  famotidine (PEPCID) 20 MG tablet  TAKE ONE TABLET BY MOUTH AT BEDTIME   30 tablet   0   .  insulin aspart (NOVOLOG) 100 UNIT/ML injection  Inject 1-5 Units into the skin 3 (three) times daily before meals. Sliding scale         .  insulin glargine (LANTUS) 100 UNIT/ML injection  Inject 16 Units into the skin at bedtime.   10 mL   0   .  losartan (COZAAR) 50 MG tablet  Take 50 mg by mouth at bedtime.         Marland Kitchen.  LYRICA 50 MG capsule  daily.         Marland Kitchen.  oxyCODONE (ROXICODONE) 5 MG immediate release tablet  1 tablet every 8 hours as needed for severe left stump pain   14 tablet   0   .  RENVELA 800 MG tablet  2 with Meals and one with snacks         .  Sucroferric Oxyhydroxide (VELPHORO) 500 MG CHEW  Chew 1 tablet by mouth 3 (three) times daily with meals. Also she takes if she has a snack.            No current facility-administered medications on file prior to visit.      Physical exam:    Filed  Vitals:     06/04/14 1108   BP:  190/81   Pulse:  96   Temp:  97.6 F (36.4 C)   TempSrc:  Oral   Resp:  16   Height:  5' 1.5" (1.562 m)   Weight:  159 lb (72.122 kg)   SpO2:  96%     Extremities: Well healed right above-knee amputation 2+ femoral pulse, left below-knee amputation 2 areas of ulceration one approximately 3 cm diameter one approximately 2 cm diameter a 3 cm diameter one was in the anterior aspect of the distal aspect of the stump the 2 cm one is more posterior. These are both dry and less than 1 mm depth. She has a very difficult to palpate left femoral pulse.  Data: The patient had aortoiliac duplex performed recently. I reviewed the findings of this today. 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 Right iliac system  was widely patent.  Assessment: Failing left below-knee amputation Plan: left above-knee amputation.  Fabienne Bruns, MD [facility] Office: [phone] Pager: [phone]

## 2014-07-06 NOTE — Progress Notes (Signed)
Inpatient Diabetes Program Recommendations  AACE/ADA: New Consensus Statement on Inpatient Glycemic Control (2013)  Target Ranges:  Prepandial:   less than 140 mg/dL      Peak postprandial:   less than 180 mg/dL (1-2 hours)      Critically ill patients:  140 - 180 mg/dL   Results for Marval RegalSURGEON, Janielle C (MRN [id number]) as of 07/06/2014 13:39  Ref. Range 07/06/2014 06:28 07/06/2014 09:08 07/06/2014 11:49  Glucose-Capillary Latest Range: 70-99 mg/dL 952173 (H) 841189 (H) 324167 (H)    Diabetes history: DM2 Outpatient Diabetes medications: Lantus 16 units QHS Current orders for Inpatient glycemic control: Lantus 16 units QHS (start when patient is taking POs)  Inpatient Diabetes Program Recommendations Correction (SSI): While inpatient, please consider ordering CBGs with Novolog sensitive correction scale ACHS.  Thanks, Orlando PennerMarie Alleyah Twombly, RN, MSN, CCRN Diabetes Coordinator Inpatient Diabetes Program [phone] (Team Pager) [phone] ([facility] office) [phone] [facility] office)

## 2014-07-06 NOTE — Consult Note (Signed)
Indication for Consultation:  Management of ESRD/hemodialysis; anemia, hypertension/volume and secondary hyperparathyroidism  HPI: [name] is a 53 y.o. female seen in PACU s/p L AKA for nonhealing wound and severe pain in L BKA. History of CAD, PAD with previous R AKA and L BKA both in 2011, DM, COPD, sickle cell trait, she [facility]. She has only been running about 2.5 hours of her treatments due to severe pain but otherwise seems to be tolerating HD. She underwent Aortogram 06/19/14 with Dr Darrick PennaFields for nonhealing wound on L BKA, no intervention to improve flow was planned at that time, there was high grade stenosis of L common iliac artery on aortoiliac duplex- recommended local wound care, since that failed she had to proceed with AKA. Will arrange for HD 4x week to keep on outpt schedule while here.   Past Medical History  Diagnosis Date  . Hypertension   . Anemia   . Systolic CHF, acute on chronic   . Coronary artery disease   . Peripheral vascular disease   . CHF (congestive heart failure)   . Angina   . Blood transfusion ~2003; 08/12/2012    "when I started on dialysis; lower gi bleeding" (08/12/2012)  . Hypercholesteremia   . DVT of leg (deep venous thrombosis) 2011    "RLE" (08/12/2012)  . Type II diabetes mellitus   . Acute lower GI bleeding 08/12/2012  . Heart murmur     some say yes , some say no  . Shortness of breath 01/02/12    "@rest ; lying down; w/exertion"; no change 08/12/2012  . COPD (chronic obstructive pulmonary disease)   . Pneumonia     Dec 2013  . Sickle cell trait   . ESRD [facility] dialysis     [facility]   Past Surgical History  Procedure Laterality Date  . 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 Av fistula placement  ~ 2003    left upper arm  . Abdominal hysterectomy  1990's  . Arteriovenous graft placement  2011    left upper arm  . Vascular  surgery    . Cardiac catheterization  ~ 2010  . Cesarean section  1988  . Esophagogastroduodenoscopy  08/12/2012    Procedure: ESOPHAGOGASTRODUODENOSCOPY (EGD);  Golden: Vertell NovakJames L Edwards Jr., MD;  [facility];  Service: Endoscopy;  Laterality: N/A;  . Colonoscopy  08/13/2012    Procedure: COLONOSCOPY;  Flanery: Vertell NovakJames L Edwards Jr., MD;  [facility];  Service: Endoscopy;  Laterality: N/A;  . Resection of arteriovenous fistula aneurysm Left 03/19/2013    Procedure: EXCISION OF LEFT BRACHIOCEPHALIC ARTERIOVENOUS FISTULA PSEUDOANEURYSM;  Drudge: Fransisco HertzBrian L Chen, MD;  [facility];  Service: Vascular;  Laterality: Left;  . Insertion of dialysis catheter N/A 03/19/2013    Procedure: INSERTION OF DIALYSIS CATHETER;  Navratil: Fransisco HertzBrian L Chen, MD;  [facility];  Service: Vascular;  Laterality: N/A;  Right Internal Jugular Placement  . Revision of arteriovenous goretex graft Left 03/19/2013    Procedure: REVISION OF ARTERIOVENOUS GORETEX GRAFT;  Portee: Fransisco HertzBrian L Chen, MD;  [facility]: [facility] OR;  Service: Vascular;  Laterality: Left;  using 6mm x 20cm Gore-Tex Graft   Family History  Problem Relation Age of Onset  . Breast cancer Sister   . Breast cancer Sister   . Kidney failure Mother   . Diabetes Mother   .  Cancer - Other Father    Social History:  reports that she has never smoked. She has never used smokeless tobacco. She reports that she does not drink alcohol or use illicit drugs. Allergies  Allergen Reactions  . Oxycodone Nausea And Vomiting   Prior to Admission medications   Medication Sig Start Date End Date Taking? Authorizing Provider  BENICAR 40 MG tablet Take 40 mg by mouth daily.  01/06/13  Yes Historical Provider, MD  carvedilol (COREG) 25 MG tablet Take 25 mg by mouth 2 (two) times daily with a meal.   Yes Historical Provider, MD  insulin glargine (LANTUS) 100 UNIT/ML injection Inject 16 Units into the skin at bedtime. 08/14/12  Yes Penny Piarlando Vega, MD  sevelamer  carbonate (RENVELA) 800 MG tablet Take 800-1,600 mg by mouth See admin instructions. Take 1600mg  with meals and 800mg  with snacks   Yes Historical Provider, MD   Current Facility-Administered Medications  Medication Dose Route Frequency Provider Last Rate Last Dose  . chlorhexidine (HIBICLENS) 4 % liquid 4 application  60 mL Topical Once Sherren Kernsharles E Fields, MD       And  . chlorhexidine (HIBICLENS) 4 % liquid 4 application  60 mL Topical Once Sherren Kernsharles E Fields, MD      . HYDROmorphone (DILAUDID) injection 0.25-0.5 mg  0.25-0.5 mg Intravenous Q5 min PRN Arta BruceKevin Ossey, MD      . meperidine (DEMEROL) injection 6.25-12.5 mg  6.25-12.5 mg Intravenous Q5 min PRN Arta BruceKevin Ossey, MD      . ondansetron [facility](ZOFRAN) injection 4 mg  4 mg Intravenous Once PRN Arta BruceKevin Ossey, MD       Labs: Basic Metabolic Panel:  Recent Labs Lab 07/03/14 1157 07/06/14 0650  NA 141 133*  K 3.8 4.7  CL 90*  --   CO2 28  --   GLUCOSE 80 185*  BUN 39*  --   CREATININE 6.04*  --   CALCIUM 10.5  --    Liver Function Tests:  Recent Labs Lab 07/03/14 1157  AST 17  ALT 12  ALKPHOS 109  BILITOT 0.4  PROT 7.7  ALBUMIN 3.5   No results for input(s): LIPASE, AMYLASE in the last 168 hours. No results for input(s): AMMONIA in the last 168 hours. CBC:  Recent Labs Lab 07/06/14 0650  HGB 12.6  HCT 37.0   Cardiac Enzymes: No results for input(s): CKTOTAL, CKMB, CKMBINDEX, TROPONINI in the last 168 hours. CBG:  Recent Labs Lab 07/06/14 0628 07/06/14 0908  GLUCAP 173* 189*   Iron Studies: No results for input(s): IRON, TIBC, TRANSFERRIN, FERRITIN in the last 72 hours. Studies/Results: No results found.    Review of Systems: Seen in PACU- unable to complete ROS. Denies pain. No complaints .  Physical Exam: Filed Vitals:   07/06/14 0915 07/06/14 0930 07/06/14 0945 07/06/14 1000  BP: 163/85 163/86 152/89 157/86  Pulse: 80 73    Temp:      TempSrc:      Resp: 25 14 14    Height:      Weight:      SpO2: 94%  99% 95% 98%     General: Well developed, well nourished, in no acute distress. Drowsy, eyes open Head: Normocephalic, atraumatic, sclera non-icteric, mucus membranes are moist Neck: Supple. JVD not elevated. No carotid bruits Lungs: Clear bilaterally anterior.  Breathing is unlabored. Heart: RRR with S1 S2. No murmurs, rubs, or gallops appreciated. Abdomen: Soft, non-tender, non-distended with hypoactive bowel sounds. No rebound/guarding. No obvious abdominal masses. M-S:  Strength and tone appear normal for age. Lower extremities:  R healed AKA, L AKA dressing intact. No edema Neuro: Drowsy. Moves all extremities spontaneously. Psych:  Eyes open, shakes head in response to questions Dialysis Access: L AVG +b/t  Dialysis Orders:  [facility]   54.4 kg 160   2K/2Ca 400/1.5   3000 Heparin  Profile 4 Hectorol 4 mcg IV/HD  Aranesp 40 - last dose 10/26  No Fe   Assessment/Plan: 1.  Pain / nonhealing wound L BKA- s/p L AKA 11/1 by Dr Darrick Penna  2.  ESRD -  [facility]. Has been only running about 2.5 hours d/t pain. K+ 4.7 HD pending today- no heparin postop 3.  Hypertension/volume  - 152/86. Home amlodipine. Will need lower edw. Looked to be gaining about 2-3kgs each tx but running short- should be able to reach edw if pain resolved.  4.  Anemia  - hgb 12.6 Cont aranesp post op and watch CBC. Ferritin 1159 (10/28) 5.  Metabolic bone disease -  Corrected Ca+ 9.9, phos 8.7 and PTH 1830 (10/29)- continue renvela when diet advanced and hectorol 6.  Nutrition - currently NPO, renal diet when advanced. Multivit. Last alb 3.8  7. DM- per primary.   Jetty Duhamel, NP [facility] [phone] 07/06/2014, 10:03 AM    Pt seen, examined and agree w A/P as above.  Vinson Moselle MD pager [phone]    cell [phone] 07/06/2014, 4:10 PM

## 2014-07-06 NOTE — Telephone Encounter (Addendum)
-----   Message from Sharee PimpleMarilyn K McChesney, RN sent at 07/06/2014  9:05 AM EST ----- Regarding: Schedule   ----- Message -----    From: Dara LordsSamantha J Rhyne, PA-C    Sent: 07/06/2014   8:59 AM      To: [facility] Charge Pool  S/p left AKA 07/06/14.  F/u with Dr. Darrick PennaFields in 4 weeks.  Thanks, Lelon MastSamantha  07/06/14: patient is still admitted, lm for pt, dpm

## 2014-07-06 NOTE — Op Note (Signed)
[facility] OPERATIVE NOTE Procedure: Left above knee amputation  Preop: non healing left BKA  Post op: same  Joyner(s): Sherren Kernsharles E Ashtynn Berke, MD  ASSISTANT: Doreatha MassedSamantha Rhyne, PA-C  Anesthesia: General  Specimens: Left leg  PROCEDURE DETAIL: After obtaining informed consent, the patient was taken to the operating room. The patient was placed in supine position the operating room table. After induction of general anesthesia the patient's entire left lower extremity was prepped and draped in usual sterile fashion. A circumferential incision was made on the left leg just above the knee. The incision was carried down into the sucutaneous tissues down to level the saphenous vein. This was ligated and divided between silk ties. Soft tissues were taken down as well as the muscle and fascia with cautery. The superficial femoral artery and vein were dissected free circumferentially clamped and divided. These were ligated proximally. Remainder of the soft tissues were taken down with cautery. The periosteum was raised on the femur approximately 5 cm above the skin edge. The femur was divided at this level. The leg was passed off the table as a specimen. Hemostasis was obtained. The wound was thoroughly irrigated with normal saline solution. The fascial edges were reapproximated using interrupted 2 0 Vicryl sutures. The subcutaneous tissues reapproximated using a running 3-0 Vicryl suture. The skin was closed staples. Patient tolerated procedure well and there were no complications. Instrument sponge and needle counts correct in the case. Patient was taken to recovery in stable condition.  Fabienne Brunsharles Mahari Vankirk, MD [facility] [place] Office: [phone] Pager: [phone]

## 2014-07-07 LAB — CBC
HCT: 33.8 % — ABNORMAL LOW (ref 36.0–46.0)
Hemoglobin: 11 g/dL — ABNORMAL LOW (ref 12.0–15.0)
MCH: 29.5 pg (ref 26.0–34.0)
MCHC: 32.5 g/dL (ref 30.0–36.0)
MCV: 90.6 fL (ref 78.0–100.0)
Platelets: 203 10*3/uL (ref 150–400)
RBC: 3.73 MIL/uL — AB (ref 3.87–5.11)
RDW: 16.5 % — ABNORMAL HIGH (ref 11.5–15.5)
WBC: 7.3 10*3/uL (ref 4.0–10.5)

## 2014-07-07 LAB — BASIC METABOLIC PANEL
ANION GAP: 21 — AB (ref 5–15)
BUN: 26 mg/dL — ABNORMAL HIGH (ref 6–23)
CO2: 24 mEq/L (ref 19–32)
CREATININE: 4.49 mg/dL — AB (ref 0.50–1.10)
Calcium: 10.1 mg/dL (ref 8.4–10.5)
Chloride: 93 mEq/L — ABNORMAL LOW (ref 96–112)
GFR calc non Af Amer: 10 mL/min — ABNORMAL LOW (ref >90)
GFR, EST AFRICAN AMERICAN: 12 mL/min — AB (ref >90)
Glucose, Bld: 67 mg/dL — ABNORMAL LOW (ref 70–99)
Potassium: 4.1 mEq/L (ref 3.7–5.3)
SODIUM: 138 meq/L (ref 137–147)

## 2014-07-07 LAB — GLUCOSE, CAPILLARY
Glucose-Capillary: 77 mg/dL (ref 70–99)
Glucose-Capillary: 64 mg/dL — ABNORMAL LOW (ref 70–99)
Glucose-Capillary: 101 mg/dL — ABNORMAL HIGH (ref 70–99)
Glucose-Capillary: 145 mg/dL — ABNORMAL HIGH (ref 70–99)

## 2014-07-07 MED ORDER — MORPHINE SULFATE 2 MG/ML IJ SOLN
INTRAMUSCULAR | Status: AC
  Filled 2014-07-07: qty 1

## 2014-07-07 MED ORDER — DARBEPOETIN ALFA 40 MCG/0.4ML IJ SOSY
PREFILLED_SYRINGE | INTRAMUSCULAR | Status: AC
  Filled 2014-07-07: qty 0.4

## 2014-07-07 MED ORDER — DOXERCALCIFEROL 4 MCG/2ML IV SOLN
INTRAVENOUS | Status: AC
  Filled 2014-07-07: qty 2

## 2014-07-07 NOTE — Progress Notes (Addendum)
   [facility] [place]  Subjective  - Doing ok, still having pain the left leg at the incisional area.   Objective 173/69 71 98.1 F (36.7 C) (Oral) 17 98%  Intake/Output Summary (Last 24 hours) at 07/07/14 1308 Last data filed at 07/07/14 1155  Gross per 24 hour  Intake      0 ml  Output   4264 ml  Net  -4264 ml    Dressing clean and dry, unwrapped and loosened the ace for comfort. Left thigh is soft  Assessment/Planning: POD #1Left above knee amputation Plan to change dressing tomorrow She had HD today and tolerated that well   Thomasena EdisCOLLINS, EMMA MAUREEN 07/07/2014 1:08 PM -- Agree with above Possible d/c tomorrow if pain controlled  Fabienne Brunsharles Aayliah Rotenberry, MD [facility] [place] Office: [phone] Pager: [phone]   Laboratory Lab Results:  Recent Labs  07/06/14 1239 07/07/14 0734  WBC 6.4 7.3  HGB 11.7* 11.0*  HCT 36.0 33.8*  PLT 217 203   BMET  Recent Labs  07/06/14 0650 07/06/14 1239 07/07/14 0734  NA 133*  --  138  K 4.7  --  4.1  CL  --   --  93*  CO2  --   --  24  GLUCOSE 185*  --  67*  BUN  --   --  26*  CREATININE  --  8.04* 4.49*  CALCIUM  --   --  10.1    COAG Lab Results  Component Value Date   INR 1.15 05/24/2010   No results found for: PTT

## 2014-07-07 NOTE — Progress Notes (Signed)
Pt has decreased appetite during shift and eat less than 25% of her lunch after returning to the room from dialysis. Pt cbg was 64 this evening; pt was given nepro to drink and encouraged to eat some of her dinner; cbg checked to be 101 after eating and drinking something. Will continue to monitor quietly. Arabella MerlesP. Amo Kiyla Ringler RN.

## 2014-07-07 NOTE — Progress Notes (Signed)
Subjective:   Feels better today, having some pain in stump.  Objective Filed Vitals:   07/07/14 0830 07/07/14 0900 07/07/14 0930 07/07/14 1000  BP: 165/81 169/78 175/89 168/79  Pulse: 66 66 66 64  Temp:      TempSrc:      Resp: 12 14 13 13   Height:      Weight:      SpO2: 100%  99%    Physical Exam General: alert and oriented. No acute distress. On HD Heart: RRR   Lungs: CTA, unlabored.  Abdomen: soft, nontender +BS Extremities:R healed AKA, L AKA dressing intact. No edema  Dialysis Access: L AVG +b/t  Dialysis Orders: [facility] 4 hour 54.4 kg 160 2K/2Ca 400/1.5 3000 Heparin  Profile 4 Hectorol 4 mcg IV/HD Aranesp 40 - last dose 10/26 No Fe  Assessment/Plan: 1. Pain / nonhealing wound L BKA- s/p L AKA 11/1 by Dr Darrick PennaFields  2. ESRD - [facility]. Has been only running about 2.5 hours d/t pain. K+ 4.1 HD again today- no heparin postop 3. Hypertension/volume - 168/79 Home amlodipine. Will need lower edw. Looked to be gaining about 2-3kgs each tx but running short- should be able to reach edw if pain resolved.   4. Anemia - hgb 11 Cont aranesp post op and watch CBC. Ferritin 1159 (10/28) 5. Metabolic bone disease - Corrected Ca+ 9.9, phos 8.7 and PTH 1830 (10/29)- continue renvela when diet advanced and hectorol 6. Nutrition -, renal diet, nepro. Multivit. Last alb 3.8  7. DM- per primary.  Jetty DuhamelBridget Whelan, NP [facility] Beeper [phone] 07/07/2014,10:19 AM  LOS: 1 day   Pt seen, examined and agree w A/P as above. Lower dry wt by 3-4 kg to account for amputation. HD today.  Vinson Moselleob Bently Morath MD pager [phone]    cell [phone] 07/07/2014, 10:36 AM    Additional Objective Labs: Basic Metabolic Panel:  Recent Labs Lab 07/03/14 1157 07/06/14 0650 07/06/14 1239 07/07/14 0734  NA 141 133*  --  138  K 3.8 4.7  --  4.1  CL 90*  --   --  93*  CO2 28  --   --  24  GLUCOSE 80 185*  --  67*  BUN 39*  --   --  26*  CREATININE 6.04*  --  8.04*  4.49*  CALCIUM 10.5  --   --  10.1   Liver Function Tests:  Recent Labs Lab 07/03/14 1157  AST 17  ALT 12  ALKPHOS 109  BILITOT 0.4  PROT 7.7  ALBUMIN 3.5   No results for input(s): LIPASE, AMYLASE in the last 168 hours. CBC:  Recent Labs Lab 07/06/14 0650 07/06/14 1239 07/07/14 0734  WBC  --  6.4 7.3  HGB 12.6 11.7* 11.0*  HCT 37.0 36.0 33.8*  MCV  --  90.7 90.6  PLT  --  217 203   Blood Culture    Component Value Date/Time   SDES WOUND ARM LEFT 03/19/2013 1408   SPECREQUEST HEMODIALYSIS GRAFT PATIENT ON FOLLOWING ZINACEF 03/19/2013 1408   CULT NO GROWTH 2 DAYS 03/19/2013 1408   REPTSTATUS 03/21/2013 FINAL 03/19/2013 1408    Cardiac Enzymes: No results for input(s): CKTOTAL, CKMB, CKMBINDEX, TROPONINI in the last 168 hours. CBG:  Recent Labs Lab 07/06/14 0908 07/06/14 1149 07/06/14 1852 07/06/14 2116 07/07/14 0624  GLUCAP 189* 167* 63* 106* 77   Iron Studies: No results for input(s): IRON, TIBC, TRANSFERRIN, FERRITIN in the last 72 hours. @lablastinr3 @ Studies/Results: No results  found. Medications:   . carvedilol  25 mg Oral BID WC  . Darbepoetin Alfa      . darbepoetin (ARANESP) injection - DIALYSIS  40 mcg Intravenous Q Tue-HD  . docusate sodium  100 mg Oral Daily  . doxercalciferol      . doxercalciferol  4 mcg Intravenous Q T,Th,Sa-HD  . enoxaparin (LOVENOX) injection  30 mg Subcutaneous Q24H  . insulin glargine  16 Units Subcutaneous QHS  . irbesartan  300 mg Oral Daily  . morphine      . multivitamin  1 tablet Oral QHS  . pantoprazole  40 mg Oral Daily  . sevelamer carbonate  1,600 mg Oral TID WC  . sodium chloride  3 mL Intravenous Q12H

## 2014-07-07 NOTE — Progress Notes (Signed)
Inpatient Diabetes Program Recommendations  AACE/ADA: New Consensus Statement on Inpatient Glycemic Control (2013)  Target Ranges:  Prepandial:   less than 140 mg/dL      Peak postprandial:   less than 180 mg/dL (1-2 hours)      Critically ill patients:  140 - 180 mg/dL  Results for Marval RegalSURGEON, Melika C (MRN [id number]) as of 07/07/2014 10:08  Ref. Range 07/06/2014 18:52 07/06/2014 21:16 07/07/2014 06:24  Glucose-Capillary Latest Range: 70-99 mg/dL 63 (L) 829106 (H) 77   Decrease Lantus to 10 units and start Novolog sensitive scale TID per Glycemic Control Order-set.  Thank you  Piedad ClimesGina Brittanni Cariker BSN, RN,CDE Inpatient Diabetes Coordinator [phone] (team pager)

## 2014-07-07 NOTE — Evaluation (Signed)
Physical Therapy Evaluation Patient Details Name: [name] C Hummel MRN: [id number] DOB: 02-Jul-1961 Today's Date: 07/07/2014   History of Present Illness  Pt s/p revision of non healing L BKA to L AKA  Clinical Impression  Pt admitted with/for revision of L BKA to AKA.  Pt currently limited functionally due to the problems listed below.  (see problems list.)  Pt will benefit from PT to maximize function and safety to be able to get home safely with available assist of family.     Follow Up Recommendations Home health PT    Equipment Recommendations  Other (comment) (TBA)    Recommendations for Other Services       Precautions / Restrictions Precautions Precautions: Fall Restrictions Weight Bearing Restrictions: No      Mobility  Bed Mobility Overal bed mobility: Needs Assistance Bed Mobility: Supine to Sit     Supine to sit: Min assist     General bed mobility comments: truncal assist with HOB elevatedto 45*  Transfers Overall transfer level: Needs assistance   Transfers: Anterior-Posterior Transfer;Lateral/Scoot Transfers       Anterior-Posterior transfers: Min guard  Lateral/Scoot Transfers: Min guard General transfer comment: stayed in the bed, but scooted backward, forward and to the side.  Ambulation/Gait                Stairs            Wheelchair Mobility    Modified Rankin (Stroke Patients Only)       Balance Overall balance assessment: Needs assistance Sitting-balance support: No upper extremity supported Sitting balance-Leahy Scale: Good                                       Pertinent Vitals/Pain Pain Assessment: 0-10 Pain Score: 8  Pain Location: AKA revision Pain Descriptors / Indicators: Headache Pain Intervention(s): Premedicated before session;Monitored during session    Home Living Family/patient expects to be discharged to:: Private residence Living Arrangements: Other (Comment);Spouse/significant  other (and adult daughter that works) Available Help at Discharge: Family Type of Home: House Home Access: Ramped entrance     Home Layout: One level;Able to live on main level with bedroom/bathroom Home Equipment: Wheelchair - manual (w/c is 55 years old and breaking down)      Prior Function Level of Independence: Independent with assistive device(s)               Hand Dominance        Extremity/Trunk Assessment   Upper Extremity Assessment: Overall WFL for tasks assessed           Lower Extremity Assessment: LLE deficits/detail   LLE Deficits / Details: limited strength testing due to pain     Communication   Communication: No difficulties  Cognition Arousal/Alertness: Awake/alert Behavior During Therapy: WFL for tasks assessed/performed Overall Cognitive Status: Within Functional Limits for tasks assessed                      General Comments      Exercises Amputee Exercises Hip Extension: AROM;Left;5 reps;Supine Hip ABduction/ADduction: AROM;5 reps;Supine;Left Hip Flexion/Marching: AROM;Left;5 reps;Supine      Assessment/Plan    PT Assessment Patient needs continued PT services  PT Diagnosis Acute pain;Generalized weakness   PT Problem List Decreased strength;Decreased activity tolerance;Decreased mobility;Decreased knowledge of precautions;Pain;Decreased balance  PT Treatment Interventions Functional mobility training;Therapeutic activities;Therapeutic exercise;Balance training;Patient/family education  PT Goals (Current goals can be found in the Care Plan section) Acute Rehab PT Goals Patient Stated Goal: Back home PT Goal Formulation: With patient Time For Goal Achievement: 07/21/14 Potential to Achieve Goals: Good    Frequency Min 3X/week   Barriers to discharge        Co-evaluation               End of Session   Activity Tolerance: Patient tolerated treatment well;Patient limited by pain Patient left: in bed Nurse  Communication: Mobility status         Time: [id number] PT Time Calculation (min): 18 min   Charges:   PT Evaluation $Initial PT Evaluation Tier I: 1 Procedure PT Treatments $Therapeutic Activity: 8-22 mins   PT G Codes:          Yashira Offenberger, Eliseo GumKenneth V 07/07/2014, 4:49 PM 07/07/2014  Geneva BingKen Cyrstal Leitz, PT [phone]  (pager)

## 2014-07-07 NOTE — Progress Notes (Signed)
OT Cancellation Note  Patient Details Name: [name] MRN: [id number] DOB: 01-Sep-1961   Cancelled Treatment:    Reason Eval/Treat Not Completed: Fatigue/lethargy limiting ability to participate  Earlie RavelingStraub, Evianna Chandran L OTR/L [phone] 07/07/2014, 4:48 PM

## 2014-07-07 NOTE — Progress Notes (Signed)
Pt in dialysis during change of shift. Arabella MerlesP. Amo Keron Neenan RN.

## 2014-07-07 NOTE — Procedures (Signed)
I was present at this dialysis session, have reviewed the session itself and made  appropriate changes  Vinson Moselleob Paula Lory MD (pgr) [phone]    [phone] 07/07/2014, 10:37 AM

## 2014-07-08 ENCOUNTER — Encounter (HOSPITAL_COMMUNITY): Payer: Self-pay | Admitting: Vascular Surgery

## 2014-07-08 LAB — BASIC METABOLIC PANEL
Anion gap: 17 — ABNORMAL HIGH (ref 5–15)
BUN: 20 mg/dL (ref 6–23)
CO2: 28 meq/L (ref 19–32)
Calcium: 10.1 mg/dL (ref 8.4–10.5)
Chloride: 88 mEq/L — ABNORMAL LOW (ref 96–112)
Creatinine, Ser: 3.64 mg/dL — ABNORMAL HIGH (ref 0.50–1.10)
GFR calc Af Amer: 15 mL/min — ABNORMAL LOW (ref >90)
GFR, EST NON AFRICAN AMERICAN: 13 mL/min — AB (ref >90)
GLUCOSE: 160 mg/dL — AB (ref 70–99)
POTASSIUM: 3.7 meq/L (ref 3.7–5.3)
Sodium: 133 mEq/L — ABNORMAL LOW (ref 137–147)

## 2014-07-08 LAB — CBC
HEMATOCRIT: 38.3 % (ref 36.0–46.0)
Hemoglobin: 12.3 g/dL (ref 12.0–15.0)
MCH: 28.9 pg (ref 26.0–34.0)
MCHC: 32.1 g/dL (ref 30.0–36.0)
MCV: 89.9 fL (ref 78.0–100.0)
PLATELETS: 187 10*3/uL (ref 150–400)
RBC: 4.26 MIL/uL (ref 3.87–5.11)
RDW: 16.4 % — ABNORMAL HIGH (ref 11.5–15.5)
WBC: 6.7 10*3/uL (ref 4.0–10.5)

## 2014-07-08 LAB — GLUCOSE, CAPILLARY
GLUCOSE-CAPILLARY: 127 mg/dL — AB (ref 70–99)
GLUCOSE-CAPILLARY: 80 mg/dL (ref 70–99)
GLUCOSE-CAPILLARY: 58 mg/dL — AB (ref 70–99)
GLUCOSE-CAPILLARY: 77 mg/dL (ref 70–99)
Glucose-Capillary: 148 mg/dL — ABNORMAL HIGH (ref 70–99)
Glucose-Capillary: 78 mg/dL (ref 70–99)
Glucose-Capillary: 64 mg/dL — ABNORMAL LOW (ref 70–99)

## 2014-07-08 MED ORDER — GLUCOSE 40 % PO GEL
ORAL | Status: AC
  Administered 2014-07-08: 37.5 g
  Filled 2014-07-08: qty 1

## 2014-07-08 MED ORDER — TRAMADOL HCL 50 MG PO TABS
50.0000 mg | ORAL_TABLET | Freq: Four times a day (QID) | ORAL | Status: DC
  Administered 2014-07-08 – 2014-07-10 (×7): 50 mg via ORAL
  Filled 2014-07-08 (×7): qty 1

## 2014-07-08 NOTE — Plan of Care (Signed)
Problem: Phase I Progression Outcomes Goal: Pain controlled with appropriate interventions Outcome: Completed/Met Date Met:  07/08/14 Goal: Incision/dressings dry and intact Outcome: Completed/Met Date Met:  07/08/14

## 2014-07-08 NOTE — Progress Notes (Signed)
Occupational Therapy Evaluation Patient Details Name: [name] C Halley MRN: [id number] DOB: 1961/01/29 Today's Date: 07/08/2014    History of Present Illness Pt s/p revision of non healing L BKA to L AKA   Clinical Impression   PTA, pt mod I with mobility and ADL. Pt lethargic this pm. ?related to pain meds. Increased assistance needed for mobility back to bed. Pt apparently has all nec DME for D/C home. Feel pt will be able to D/C home with 24/7 S and HHOT. Will follow acutely to address established goals.     Follow Up Recommendations  Home health OT;Supervision/Assistance - 24 hour    Equipment Recommendations  None recommended by OT    Recommendations for Other Services       Precautions / Restrictions Precautions Precautions: Fall      Mobility Bed Mobility Overal bed mobility: Needs Assistance Bed Mobility: Sit to Supine     Supine to sit: Min guard     General bed mobility comments: Min guard for supine to long sit. Demonstrates good balance in sitting.   Transfers Overall transfer level: Needs assistance Equipment used: 2 person hand held assist Transfers: Licensed conveyancerAnterior-Posterior Transfer       Anterior-Posterior transfers: Mod assist;+2 physical assistance   General transfer comment: Increased assistance required this pm to transfer from lower level onto bed. Pt "sleepy"    Balance Overall balance assessment: Needs assistance Sitting-balance support: Bilateral upper extremity supported Sitting balance-Leahy Scale: Fair                                      ADL Overall ADL's : Needs assistance/impaired         Upper Body Bathing: Set up;Sitting   Lower Body Bathing: Minimal assistance;Sitting/lateral leans   Upper Body Dressing : Set up;Sitting   Lower Body Dressing: Moderate assistance;Sitting/lateral leans   Toilet Transfer: +2 for physical assistance;Moderate assistance;Anterior/posterior (increased assistance needed for gong  back to bed)           Functional mobility during ADLs: +2 for physical assistance;Moderate assistance General ADL Comments: States husband and family will be able to assist     Vision                     Perception     Praxis      Pertinent Vitals/Pain Pain Assessment: 0-10 Pain Score: 7  Pain Location: LLE Pain Descriptors / Indicators: Aching Pain Intervention(s): Limited activity within patient's tolerance;Monitored during session     Hand Dominance     Extremity/Trunk Assessment Upper Extremity Assessment Upper Extremity Assessment: Overall WFL for tasks assessed   Lower Extremity Assessment Lower Extremity Assessment: Defer to PT evaluation   Cervical / Trunk Assessment Cervical / Trunk Assessment: Normal   Communication Communication Communication: HOH   Cognition Arousal/Alertness: Lethargic Behavior During Therapy: Flat affect Overall Cognitive Status: No family/caregiver present to determine baseline cognitive functioning                     General Comments   Pt very appreciative of help.    Exercises Exercises: Amputee     Shoulder Instructions      Home Living Family/patient expects to be discharged to:: Private residence Living Arrangements: Other (Comment);Spouse/significant other Available Help at Discharge: Family Type of Home: House Home Access: Ramped entrance     Home Layout: One level;Able to live on  main level with bedroom/bathroom     Bathroom Shower/Tub: Tub/shower unit Shower/tub characteristics: Engineer, building servicesCurtain Bathroom Toilet: Standard Bathroom Accessibility: Yes How Accessible: Accessible via wheelchair Home Equipment: Bedside commode;Tub bench;Wheelchair - manual (FPL Groupslide board)          Prior Functioning/Environment Level of Independence: Independent with assistive device(s)             OT Diagnosis: Generalized weakness;Acute pain   OT Problem List: Decreased strength;Decreased activity  tolerance;Impaired balance (sitting and/or standing);Decreased knowledge of use of DME or AE;Pain;Impaired sensation   OT Treatment/Interventions: Self-care/ADL training;Therapeutic exercise;DME and/or AE instruction;Therapeutic activities;Balance training    OT Goals(Current goals can be found in the care plan section) Acute Rehab OT Goals Patient Stated Goal: Back home OT Goal Formulation: With patient Time For Goal Achievement: 07/22/14 Potential to Achieve Goals: Good  OT Frequency: Min 2X/week   Barriers to D/C:            Co-evaluation              End of Session Nurse Communication: Mobility status  Activity Tolerance: Patient limited by lethargy Patient left: in bed;with call bell/phone within reach;with bed alarm set   Time: 1600-1620 OT Time Calculation (min): 20 min Charges:  OT General Charges $OT Visit: 1 Procedure OT Evaluation $Initial OT Evaluation Tier I: 1 Procedure OT Treatments $Self Care/Home Management : 8-22 mins G-Codes:    Ramon Zanders,HILLARY 07/08/2014, 4:34 PM   [facility] Dennis Hegeman, OTR/L  [phone] 07/08/2014

## 2014-07-08 NOTE — Progress Notes (Signed)
Subjective:   No complaints. 2.2L off with HD yest, BP high preHD and is now low.   Objective Filed Vitals:   07/07/14 2100 07/08/14 0020 07/08/14 0401 07/08/14 0626  BP: 130/61 110/55 107/66 95/56  Pulse: 73 70 60 72  Temp: 98 F (36.7 C) 98.4 F (36.9 C) 98.7 F (37.1 C)   TempSrc: Oral Oral Oral   Resp: 19 18 19    Height:      Weight:      SpO2: 98% 100% 90% 99%   Physical Exam General: alert and oriented. No acute distress Heart: RRR   Lungs: CTA, unlabored.  Abdomen: soft, nontender +BS Extremities:R healed AKA, L AKA dressing intact. No edema  Dialysis Access: L AVG +b/t, R IJ cath Neuro: is alert, ox 3  Dialysis Orders: [facility] 4 hour 41.5kgs 3K/2.25 Ca 5100 Heparin R IJ 400/1.5  Profile 4 Hectorol 4 mcg IV/HD Aranesp 40 - last dose 10/26 No Fe  Assessment: 1. Pain / nonhealing wound L BKA- s/p revision to L AKA 11/1 by Dr Darrick PennaFields  2. ESRD - [facility]. OK to use tight hep now 3. HD access - using IJ cath, not sure status of LUA access, will check 4. Hypertension/volume - BP's low today, will hold BP meds. Weights are off I suspect, no vol excess on exam  5. Anemia - hgb 11 Cont aranesp post op and watch CBC. Ferritin 1159 (10/28) 6. Metabolic bone disease - Corrected Ca+ 9.9, phos 8.7 and PTH 1830 (10/29)- continue renvela when diet advanced and hectorol 7. Nutrition -, renal diet, nepro. Multivit. Last alb 3.8  8. DM- per primary.  Plan - HD tomorrow, get Hoyer weight, hold BP meds if BP less than 130.   Paula Moselleob Jaleil Renwick MD pager [phone]    cell [phone] 07/08/2014, 9:55 AM    Additional Objective Labs: Basic Metabolic Panel:  Recent Labs Lab 07/03/14 1157 07/06/14 0650 07/06/14 1239 07/07/14 0734 07/08/14 0435  NA 141 133*  --  138 133*  K 3.8 4.7  --  4.1 3.7  CL 90*  --   --  93* 88*  CO2 28  --   --  24 28  GLUCOSE 80 185*  --  67* 160*  BUN 39*  --   --  26* 20  CREATININE 6.04*  --  8.04* 4.49* 3.64*  CALCIUM 10.5  --   --   10.1 10.1   Liver Function Tests:  Recent Labs Lab 07/03/14 1157  AST 17  ALT 12  ALKPHOS 109  BILITOT 0.4  PROT 7.7  ALBUMIN 3.5   No results for input(s): LIPASE, AMYLASE in the last 168 hours. CBC:  Recent Labs Lab 07/06/14 1239 07/07/14 0734 07/08/14 0435  WBC 6.4 7.3 6.7  HGB 11.7* 11.0* 12.3  HCT 36.0 33.8* 38.3  MCV 90.7 90.6 89.9  PLT 217 203 187   Blood Culture    Component Value Date/Time   SDES WOUND ARM LEFT 03/19/2013 1408   SPECREQUEST HEMODIALYSIS GRAFT PATIENT ON FOLLOWING ZINACEF 03/19/2013 1408   CULT NO GROWTH 2 DAYS 03/19/2013 1408   REPTSTATUS 03/21/2013 FINAL 03/19/2013 1408    Cardiac Enzymes: No results for input(s): CKTOTAL, CKMB, CKMBINDEX, TROPONINI in the last 168 hours. CBG:  Recent Labs Lab 07/07/14 0624 07/07/14 1615 07/07/14 1755 07/07/14 2323 07/08/14 0622  GLUCAP 77 64* 101* 145* 148*   Iron Studies: No results for input(s): IRON, TIBC, TRANSFERRIN, FERRITIN in the last 72 hours. @lablastinr3 @ Studies/Results: No  results found. Medications:   . carvedilol  25 mg Oral BID WC  . darbepoetin (ARANESP) injection - DIALYSIS  40 mcg Intravenous Q Tue-HD  . docusate sodium  100 mg Oral Daily  . doxercalciferol  4 mcg Intravenous Q T,Th,Sa-HD  . enoxaparin (LOVENOX) injection  30 mg Subcutaneous Q24H  . insulin glargine  16 Units Subcutaneous QHS  . irbesartan  300 mg Oral Daily  . multivitamin  1 tablet Oral QHS  . pantoprazole  40 mg Oral Daily  . sevelamer carbonate  1,600 mg Oral TID WC  . sodium chloride  3 mL Intravenous Q12H  . traMADol  50 mg Oral 4 times per day

## 2014-07-08 NOTE — Progress Notes (Signed)
Physical Therapy Treatment Patient Details Name: Aura Dialszelle C Brand MRN: [id number] DOB: September 21, 1960 Today's Date: 07/08/2014    History of Present Illness Pt s/p revision of non healing L BKA to L AKA    PT Comments    Pt progressing slowly towards physical therapy goals. Able to practice posterior scoot transfer with min assist from bed to chair today, but is limited by lethargy, tolerating minimal exercise and further mobility training. She is oriented to time/place/situation and reports she will have 24 hour family at home. Anticipate she will do well with mobility when she is more alert. Patient will continue to benefit from skilled physical therapy services to further improve independence with functional mobility.   Follow Up Recommendations  Home health PT     Equipment Recommendations  Wheelchair (measurements PT);Wheelchair cushion (measurements PT) (Pt reports her W/c at home is broken and the wheel falls off)    Recommendations for Other Services       Precautions / Restrictions Precautions Precautions: Fall    Mobility  Bed Mobility Overal bed mobility: Needs Assistance Bed Mobility: Supine to Sit     Supine to sit: Min guard     General bed mobility comments: Min guard for supine to long sit. Demonstrates good balance in sitting.   Transfers Overall transfer level: Needs assistance Equipment used: None Transfers: Licensed conveyancerAnterior-Posterior Transfer       Anterior-Posterior transfers: Min assist   General transfer comment: Pt with good UE strength maneuvering body posteriorly into chair. loss of balance posteriorly requiring min assist when transitioning from higher bed surface to lower chair surface. After correction pt was able to scoot with supervision the rest of the way posteriorly.  Ambulation/Gait                 Stairs            Wheelchair Mobility    Modified Rankin (Stroke Patients Only)       Balance                                    Cognition Arousal/Alertness: Lethargic;Suspect due to medications Behavior During Therapy: Flat affect Overall Cognitive Status: Within Functional Limits for tasks assessed                      Exercises Amputee Exercises Hip Flexion/Marching: AROM;Both;10 reps;Seated    General Comments General comments (skin integrity, edema, etc.): Spent time with patient discussing home management, equipment needs, concerns pt has, care available, and transfer techniques.      Pertinent Vitals/Pain Pain Assessment: No/denies pain ("I was in pain earlier") Pain Intervention(s): Monitored during session    Home Living                      Prior Function            PT Goals (current goals can now be found in the care plan section) Acute Rehab PT Goals PT Goal Formulation: With patient Time For Goal Achievement: 07/21/14 Potential to Achieve Goals: Good Progress towards PT goals: Progressing toward goals    Frequency  Min 3X/week    PT Plan Current plan remains appropriate;Equipment recommendations need to be updated    Co-evaluation             End of Session   Activity Tolerance: Patient limited by lethargy Patient left: in chair;with call  bell/phone within reach     Time: 1411-1434 PT Time Calculation (min): 23 min  Charges:  $Therapeutic Activity: 8-22 mins $Self Care/Home Management: 8-22                    G Codes:      [place], Clay Menser S 07/08/2014, 2:46 PM  Sunday SpillersLogan Secor [place], [place]PT [phone]

## 2014-07-08 NOTE — Progress Notes (Signed)
Pt family refused for pt left AKA stump to be dressed. Pt family state " Dr said we should let it air dry so let us hold off the dressing today". Staples to stump clean dry and intact; no s/s of drainage or infection noted. Site open to air with dsg change at bedside; will continue to monitor quietly and report off to incoming RN. Arabella MerlesP. Amo Nikolaos Maddocks RN.

## 2014-07-08 NOTE — Progress Notes (Signed)
Pt had x 1 emesis which was brownish color. Vitals taken and cbg checked to be 80; pt voices feeling much better after the emesis, denies any nausea afterwards. Pt report some difficulty hearing. Pt given Nepro drink which she drank about 25ml, decrease appetite and pt encouraged to eat; pt noted to be sleepy and dozing off whiles conversational; family request pt 1800 tramadol be held and not given; pt Tramadol not given; pt denies any discomfort, bowel sound x4 active; pt in bed comfortably with call light within reach and family at bedside. Will continue to monitor quietly.    07/08/14 1746  Vitals  Temp 97.4 F (36.3 C)  Temp Source Oral  BP (!) 91/45 mmHg  BP Location Right Arm  BP Method Automatic  Patient Position (if appropriate) Lying  Pulse Rate 77  Pulse Rate Source Dinamap  Resp 18  Oxygen Therapy  SpO2 100 %  O2 Device Nasal Cannula  O2 Flow Rate (L/min) 2 L/min

## 2014-07-08 NOTE — Progress Notes (Signed)
[facility] [place]  Subjective  - Having some pain left AKA today   Objective 95/56 72 98.7 F (37.1 C) (Oral) 19 99%  Intake/Output Summary (Last 24 hours) at 07/08/14 1039 Last data filed at 07/08/14 0400  Gross per 24 hour  Intake    243 ml  Output   2264 ml  Net  -2021 ml   Left AKA incision healing well so far, no drainage or hematoma  Assessment/Planning: Still requiring some IV pain meds, Continue to wean Dry dressing ACE wrap once daily Has HD tomorrow  Will plan for d/c home Friday as pain and dialysis should not be an issue by then  [name] E 07/08/2014 10:39 AM --  Laboratory Lab Results:  Recent Labs  07/07/14 0734 07/08/14 0435  WBC 7.3 6.7  HGB 11.0* 12.3  HCT 33.8* 38.3  PLT 203 187   BMET  Recent Labs  07/07/14 0734 07/08/14 0435  NA 138 133*  K 4.1 3.7  CL 93* 88*  CO2 24 28  GLUCOSE 67* 160*  BUN 26* 20  CREATININE 4.49* 3.64*  CALCIUM 10.1 10.1    COAG Lab Results  Component Value Date   INR 1.15 05/24/2010   No results found for: PTT

## 2014-07-09 LAB — BASIC METABOLIC PANEL
ANION GAP: 22 — AB (ref 5–15)
BUN: 37 mg/dL — AB (ref 6–23)
CHLORIDE: 89 meq/L — AB (ref 96–112)
CO2: 25 meq/L (ref 19–32)
CREATININE: 6.02 mg/dL — AB (ref 0.50–1.10)
Calcium: 10.2 mg/dL (ref 8.4–10.5)
GFR calc non Af Amer: 7 mL/min — ABNORMAL LOW (ref >90)
GFR, EST AFRICAN AMERICAN: 8 mL/min — AB (ref >90)
Glucose, Bld: 97 mg/dL (ref 70–99)
Potassium: 4.2 mEq/L (ref 3.7–5.3)
Sodium: 136 mEq/L — ABNORMAL LOW (ref 137–147)

## 2014-07-09 LAB — GLUCOSE, CAPILLARY
GLUCOSE-CAPILLARY: 102 mg/dL — AB (ref 70–99)
GLUCOSE-CAPILLARY: 86 mg/dL (ref 70–99)
Glucose-Capillary: 105 mg/dL — ABNORMAL HIGH (ref 70–99)
Glucose-Capillary: 107 mg/dL — ABNORMAL HIGH (ref 70–99)
Glucose-Capillary: 109 mg/dL — ABNORMAL HIGH (ref 70–99)
Glucose-Capillary: 144 mg/dL — ABNORMAL HIGH (ref 70–99)

## 2014-07-09 LAB — CBC WITH DIFFERENTIAL/PLATELET
BASOS ABS: 0.1 10*3/uL (ref 0.0–0.1)
Basophils Relative: 1 % (ref 0–1)
Eosinophils Absolute: 0.1 10*3/uL (ref 0.0–0.7)
Eosinophils Relative: 1 % (ref 0–5)
HEMATOCRIT: 37.8 % (ref 36.0–46.0)
HEMOGLOBIN: 12.2 g/dL (ref 12.0–15.0)
Lymphocytes Relative: 12 % (ref 12–46)
Lymphs Abs: 1.1 10*3/uL (ref 0.7–4.0)
MCH: 28.8 pg (ref 26.0–34.0)
MCHC: 32.3 g/dL (ref 30.0–36.0)
MCV: 89.2 fL (ref 78.0–100.0)
MONO ABS: 1 10*3/uL (ref 0.1–1.0)
MONOS PCT: 11 % (ref 3–12)
NEUTROS ABS: 6.9 10*3/uL (ref 1.7–7.7)
Neutrophils Relative %: 75 % (ref 43–77)
Platelets: 241 10*3/uL (ref 150–400)
RBC: 4.24 MIL/uL (ref 3.87–5.11)
RDW: 16.2 % — ABNORMAL HIGH (ref 11.5–15.5)
WBC: 9.1 10*3/uL (ref 4.0–10.5)

## 2014-07-09 MED ORDER — ALTEPLASE 2 MG IJ SOLR
2.0000 mg | Freq: Once | INTRAMUSCULAR | Status: DC | PRN
  Filled 2014-07-09: qty 2

## 2014-07-09 MED ORDER — IRBESARTAN 150 MG PO TABS
150.0000 mg | ORAL_TABLET | Freq: Every day | ORAL | Status: DC
  Administered 2014-07-09: 150 mg via ORAL
  Filled 2014-07-09 (×2): qty 1

## 2014-07-09 MED ORDER — PENTAFLUOROPROP-TETRAFLUOROETH EX AERO: 1.0000 "application " | INHALATION_SPRAY | CUTANEOUS | Status: DC | PRN

## 2014-07-09 MED ORDER — LIDOCAINE-PRILOCAINE 2.5-2.5 % EX CREA: 1.0000 "application " | TOPICAL_CREAM | CUTANEOUS | Status: DC | PRN

## 2014-07-09 MED ORDER — NALOXONE HCL 0.4 MG/ML IJ SOLN
0.4000 mg | Freq: Once | INTRAMUSCULAR | Status: AC
  Administered 2014-07-09: 0.4 mg via INTRAVENOUS
  Filled 2014-07-09: qty 1

## 2014-07-09 MED ORDER — SODIUM CHLORIDE 0.9 % IV SOLN: 100.0000 mL | INTRAVENOUS | Status: DC | PRN

## 2014-07-09 MED ORDER — HEPARIN SODIUM (PORCINE) 1000 UNIT/ML DIALYSIS: 1500.0000 [IU] | INTRAMUSCULAR | Status: DC | PRN

## 2014-07-09 MED ORDER — DOXERCALCIFEROL 4 MCG/2ML IV SOLN
INTRAVENOUS | Status: AC
  Filled 2014-07-09: qty 2

## 2014-07-09 MED ORDER — LIDOCAINE HCL (PF) 1 % IJ SOLN: 5.0000 mL | INTRAMUSCULAR | Status: DC | PRN

## 2014-07-09 MED ORDER — CARVEDILOL 12.5 MG PO TABS
12.5000 mg | ORAL_TABLET | Freq: Two times a day (BID) | ORAL | Status: DC
  Administered 2014-07-09 – 2014-07-10 (×2): 12.5 mg via ORAL
  Filled 2014-07-09 (×4): qty 1

## 2014-07-09 MED ORDER — CARVEDILOL 6.25 MG PO TABS: 6.2500 mg | ORAL_TABLET | Freq: Two times a day (BID) | ORAL | Status: DC

## 2014-07-09 MED ORDER — HEPARIN SODIUM (PORCINE) 1000 UNIT/ML DIALYSIS
1000.0000 [IU] | INTRAMUSCULAR | Status: DC | PRN
  Filled 2014-07-09: qty 1

## 2014-07-09 MED ORDER — NEPRO/CARBSTEADY PO LIQD: 237.0000 mL | ORAL | Status: DC | PRN

## 2014-07-09 MED ORDER — GLUCOSE 40 % PO GEL: 1.0000 | ORAL | Status: DC | PRN

## 2014-07-09 NOTE — Procedures (Signed)
I was present at this dialysis session, have reviewed the session itself and made  appropriate changes  Vinson Moselleob Sven Pinheiro MD (pgr) [phone]    [phone] 07/09/2014, 10:03 AM

## 2014-07-09 NOTE — Progress Notes (Signed)
[facility] [place]  Subjective  - Still sleepy this am but comfortable   Objective 114/67 79 98.2 F (36.8 C) (Oral) 20 96% No intake or output data in the 24 hours ending 07/09/14 0952  Left AKA healing Arousable alert and oriented x 3   Assessment/Planning: S/p left AKA HD today D/c home tomorrow   [name],[name] 07/09/2014 9:52 AM --  Laboratory Lab Results:  Recent Labs  07/08/14 0435 07/09/14 0432  WBC 6.7 9.1  HGB 12.3 12.2  HCT 38.3 37.8  PLT 187 241   BMET  Recent Labs  07/08/14 0435 07/09/14 0432  NA 133* 136*  K 3.7 4.2  CL 88* 89*  CO2 28 25  GLUCOSE 160* 97  BUN 20 37*  CREATININE 3.64* 6.02*  CALCIUM 10.1 10.2    COAG Lab Results  Component Value Date   INR 1.15 05/24/2010   No results found for: PTT

## 2014-07-09 NOTE — Progress Notes (Signed)
Inpatient Diabetes Program Recommendations  AACE/ADA: New Consensus Statement on Inpatient Glycemic Control (2013)  Target Ranges:  Prepandial:   less than 140 mg/dL      Peak postprandial:   less than 180 mg/dL (1-2 hours)      Critically ill patients:  140 - 180 mg/dL   Reason for Visit: Results for Marval RegalSURGEON, Shaterrica C (MRN [id number]) as of 07/09/2014 10:26  Ref. Range 07/08/2014 21:31 07/08/2014 22:01 07/08/2014 22:26 07/08/2014 23:59 07/09/2014 04:32 07/09/2014 05:36  Glucose-Capillary Latest Range: 70-99 mg/dL 64 (L) 58 (L) 77 [id number] (H)  107 (H)   Note low CBG's last PM.  Lantus was not given.  Please consider reducing Lantus to 8 units q HS.  Thanks, Beryl MeagerJenny Azlynn Mitnick, RN, BC-ADM Inpatient Diabetes Coordinator Pager [phone]

## 2014-07-09 NOTE — Progress Notes (Addendum)
Pt very drowsy and slow to respond.  Pt stated  that she cannot hear very well and can only hear out of her right ear; this is a change from baseline. Pt is oriented to self and birth date; disoriented to situation; this is also a change from baseline. Blood sugar was 64; crackers, peanut butter, and juice given. CBG retaken and  it was 58; oral glucose was administered. CBG was retaken and it came up to 77. RN continue to monitor.   During hourly rounding pt noticed to be slow to respond, even more compared to earlier in the shift . Pt CBG rechecked ;109 . VSS. Rapid Response Nurse called for further evaluation. RRN recommended to page on call. Dr. Hart RochesterLawson was called and is aware of the situation. No new orders given. Will continue to monitor.

## 2014-07-09 NOTE — Progress Notes (Signed)
Subjective:   No complaints. 2.2L off with HD yest, BP high preHD and is now low.   Objective Filed Vitals:   07/09/14 0814 07/09/14 0830 07/09/14 0900 07/09/14 0930  BP: 118/64 132/69 117/65 114/67  Pulse: 74 70 73 79  Temp:      TempSrc:      Resp:      Height:      Weight:      SpO2:       Physical Exam General: alert and oriented. No acute distress Heart: RRR   Lungs: CTA, unlabored.  Abdomen: soft, nontender +BS Extremities:R healed AKA, L AKA dressing intact. No edema  Dialysis Access: L AVG +b/t, R IJ cath Neuro: is alert, ox 3  Dialysis Orders: [facility] 4 hour 54.5kg 3K/2.25 Ca 5100 Heparin R IJ 400/1.5  Profile 4 Hectorol 4 mcg IV/HD Aranesp 40 - last dose 10/26 No Fe  Assessment: 1. Pain / nonhealing wound L BKA- s/p revision to L AKA 11/1 by Dr Darrick PennaFields  2. ESRD - [facility]. OK to use tight hep now 3. Hypertension/volume - BP's were high now low, will reduce irbesartan and coreg 4. Anemia - hgb 11 Cont aranesp post op and watch CBC. Ferritin 1159 (10/28) 5. Metabolic bone disease - Corrected Ca+ 9.9, phos 8.7 and PTH 1830 (10/29)- continue renvela when diet advanced and hectorol 6. Nutrition -, renal diet, nepro. Multivit. Last alb 3.8  7. DM- per primary.  Plan - HD today, UF 1 kg as tolerated  Vinson Moselleob Melady Chow MD pager [phone]    cell [phone] 07/09/2014, 9:54 AM    Additional Objective Labs: Basic Metabolic Panel:  Recent Labs Lab 07/07/14 0734 07/08/14 0435 07/09/14 0432  NA 138 133* 136*  K 4.1 3.7 4.2  CL 93* 88* 89*  CO2 24 28 25   GLUCOSE 67* 160* 97  BUN 26* 20 37*  CREATININE 4.49* 3.64* 6.02*  CALCIUM 10.1 10.1 10.2   Liver Function Tests:  Recent Labs Lab 07/03/14 1157  AST 17  ALT 12  ALKPHOS 109  BILITOT 0.4  PROT 7.7  ALBUMIN 3.5   No results for input(s): LIPASE, AMYLASE in the last 168 hours. CBC:  Recent Labs Lab 07/06/14 1239 07/07/14 0734 07/08/14 0435 07/09/14 0432  WBC 6.4 7.3 6.7 9.1   NEUTROABS  --   --   --  6.9  HGB 11.7* 11.0* 12.3 12.2  HCT 36.0 33.8* 38.3 37.8  MCV 90.7 90.6 89.9 89.2  PLT 217 203 187 241   Blood Culture    Component Value Date/Time   SDES WOUND ARM LEFT 03/19/2013 1408   SPECREQUEST HEMODIALYSIS GRAFT PATIENT ON FOLLOWING ZINACEF 03/19/2013 1408   CULT NO GROWTH 2 DAYS 03/19/2013 1408   REPTSTATUS 03/21/2013 FINAL 03/19/2013 1408    Cardiac Enzymes: No results for input(s): CKTOTAL, CKMB, CKMBINDEX, TROPONINI in the last 168 hours. CBG:  Recent Labs Lab 07/08/14 2131 07/08/14 2201 07/08/14 2226 07/08/14 2359 07/09/14 0536  GLUCAP 64* 58* 77 109* 107*   Iron Studies: No results for input(s): IRON, TIBC, TRANSFERRIN, FERRITIN in the last 72 hours. @lablastinr3 @ Studies/Results: No results found. Medications:   . carvedilol  6.25 mg Oral BID WC  . darbepoetin (ARANESP) injection - DIALYSIS  40 mcg Intravenous Q Tue-HD  . docusate sodium  100 mg Oral Daily  . doxercalciferol      . doxercalciferol  4 mcg Intravenous Q T,Th,Sa-HD  . enoxaparin (LOVENOX) injection  30 mg Subcutaneous Q24H  . insulin  glargine  16 Units Subcutaneous QHS  . irbesartan  150 mg Oral Daily  . multivitamin  1 tablet Oral QHS  . pantoprazole  40 mg Oral Daily  . sevelamer carbonate  1,600 mg Oral TID WC  . sodium chloride  3 mL Intravenous Q12H  . traMADol  50 mg Oral 4 times per day

## 2014-07-09 NOTE — Care Management Note (Signed)
    Page 1 of 1   07/10/2014     3:22:31 PM CARE MANAGEMENT NOTE 07/10/2014  Patient:  [name], [name]   Account Number:  [id number]  Date Initiated:  07/09/2014  Documentation initiated by:  Aizen Duval  Subjective/Objective Assessment:   Pt adm on 07/06/14 s/p Lt AKA. Has previous Rt AKA.  PTA, pt resides at home with and is cared for by family.     Action/Plan:   Will follow for dc needs as pt progresses.  PT/OT recommending HH follow up, WC and cushion.  Will need orders for [facility] and DME.   Anticipated DC Date:  07/10/2014   Anticipated DC Plan:  [place]  CM consult      Choice offered to / List presented to:             Status of service:  Completed, signed off Medicare Important Message given?  YES (If response is "NO", the following Medicare IM given date fields will be blank) Date Medicare IM given:  07/10/2014 Medicare IM given by:  Abrielle Finck Date Additional Medicare IM given:   Additional Medicare IM given by:    Discharge Disposition:  HOME/SELF CARE  Per UR Regulation:  Reviewed for med. necessity/level of care/duration of stay  If discussed at [place] of Stay Meetings, dates discussed:    Comments:  07/10/14 Ellan Lambert, RN, BSN [phone] Met with pt and family to discuss dc needs.  PT/OT recommending follow up care at home, WC.  Pt has a WC, but states it is falling apart.  Unfortunately pt received WC in 2011 from [facility], and insurance will only pay for one every five years.  Daughter does not think it will last a year. Checked on possibly renting WC; and cost is $170/month--information passed on to daughter.  Daughter states she takes care of mom at home,and does not need any assistance of home care.

## 2014-07-09 NOTE — Progress Notes (Signed)
Pt continued to be lethargic.VSS. M.D on call paged and orders were given to administer 0.4mg  Narcan and obtain labs. Narcan was given and patient became alert & oriented X4 . Ice pack was applied to stump to help with pain. Pt daughter came to bedside to comfort her. RN continued to monitor.

## 2014-07-09 NOTE — Significant Event (Signed)
Rapid Response Event Note Called per floor Rn for pt with increased lethargy. VSS. HD pt for dialysis tomorrow. Admit 11/02 for L AKA surgery done 11/ 1 . Pt has had hypoglycemia tonight last CBG 109 at 2359 . Received frequent doses of IV morphine since surgery last dose 11/4 1100.   Overview: Time Called: 0005 Arrival Time: 0007 Event Type: Neurologic  Initial Focused Assessment: Pt awake, responds slowly to voice. Moves upper extremities purposefully with no weakness noted. Does not follow commands but responds appropriately to some questions. Denies pain.  Interventions: Advised RN to page MD to update on pt condition.   0100 follow up visit-MD called no orders received 0215 follow up visit-pt with worsening confusion, per RN appears to be having hallucinations, grabbing at things. VSS. Suggested labs, ? Narcan. MD notified and orders recieved  Event Summary: Name of Physician Notified: Dr. Darrick PennaFields at 0015     at    Outcome: Stayed in room and stabalized     White, James IvanoffBrooke Leigh Dierdra Salameh

## 2014-07-10 LAB — GLUCOSE, CAPILLARY: GLUCOSE-CAPILLARY: 127 mg/dL — AB (ref 70–99)

## 2014-07-10 MED ORDER — TRAMADOL HCL 50 MG PO TABS: 50.0000 mg | ORAL_TABLET | Freq: Four times a day (QID) | ORAL | Status: DC

## 2014-07-10 NOTE — Progress Notes (Addendum)
   [facility] [place]  Subjective  - Yes she is ready to go home.  Doing fine today   Objective 114/52 68 98 F (36.7 C) (Oral) 18 100%  Intake/Output Summary (Last 24 hours) at 07/10/14 0743 Last data filed at 07/09/14 1300  Gross per 24 hour  Intake    120 ml  Output    783 ml  Net   -663 ml    AKA healing well without hematoma or drainage Clean dry dressing applied  Assessment/Planning: POD #4left AKA F/U in 4 weeks for staple removal     Clinton GallantCOLLINS, EMMA [facility] 07/10/2014 7:43 AM -- Stump healing D/c home today  Fabienne Brunsharles Casimira Sutphin, MD [facility] [place] Office: [phone] Pager: [phone]  Laboratory Lab Results:  Recent Labs  07/08/14 0435 07/09/14 0432  WBC 6.7 9.1  HGB 12.3 12.2  HCT 38.3 37.8  PLT 187 241   BMET  Recent Labs  07/08/14 0435 07/09/14 0432  NA 133* 136*  K 3.7 4.2  CL 88* 89*  CO2 28 25  GLUCOSE 160* 97  BUN 20 37*  CREATININE 3.64* 6.02*  CALCIUM 10.1 10.2    COAG Lab Results  Component Value Date   INR 1.15 05/24/2010   No results found for: PTT

## 2014-07-10 NOTE — Progress Notes (Signed)
Pt states that she takes her daily meds at night and does not want any meds prior to discharge. Louie BunWilson,Willetta York S 9:18 AM

## 2014-07-10 NOTE — Progress Notes (Signed)
Pt family concerned about patient's hearing, MD paged and recommend family take her to OP doctor for checkup. Louie BunWilson,Jovin Fester S 11:14 AM

## 2014-07-10 NOTE — Progress Notes (Signed)
PT Cancellation Note  Patient Details Name: [name] MRN: [id number] DOB: 1960/10/15   Cancelled Treatment:    Reason Eval/Treat Not Completed: Other (comment); patient in process of eating, bathing and dressed for D/C.  States cannot get new w/c till next year.  Encouraged to call DME provider for service for her wheelchair.  Will defer PT due to moving in prep for d/c today.  HHPT to continue after d/c.   WYNN,CYNDI 07/10/2014, 8:47 AM

## 2014-07-10 NOTE — Progress Notes (Signed)
Pt discharged home  Discharge instructions given & reviewed with patient and family Eduction discussed  IV dc'd  Tele dc'd  Pt discharged via wheelchair with volunteer services.  Louie BunWilson,Elsey Holts S 11:42 AM

## 2014-07-15 NOTE — Discharge Summary (Signed)
[facility] Discharge Summary   Patient ID:  [name] MRN: [id number] DOB/AGE: 09/21/1960 53 y.o.  Admit date: 07/06/2014 Discharge date: 07/15/2014 Date of Surgery: 07/06/2014 Mixson: Fiorillo(s): Sherren Kernsharles E Fields, MD  Admission Diagnosis: Peripheral Vascular Disease with Ulceration I70.269  Discharge Diagnoses:  Peripheral Vascular Disease with Ulceration I70.269  Secondary Diagnoses: Past Medical History  Diagnosis Date  . Hypertension   . Anemia   . Systolic CHF, acute on chronic   . Coronary artery disease   . Peripheral vascular disease   . CHF (congestive heart failure)   . Angina   . Blood transfusion ~2003; 08/12/2012    "when I started on dialysis; lower gi bleeding" (08/12/2012)  . Hypercholesteremia   . DVT of leg (deep venous thrombosis) 2011    "RLE" (08/12/2012)  . Type II diabetes mellitus   . Acute lower GI bleeding 08/12/2012  . Heart murmur     some say yes , some say no  . Shortness of breath 01/02/12    "@rest ; lying down; w/exertion"; no change 08/12/2012  . COPD (chronic obstructive pulmonary disease)   . Pneumonia     Dec 2013  . Sickle cell trait   . 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 She developed an ulceration on her left below-knee amputation and some pain approximately one month ago.  The patient had aortoiliac duplex performed recently. I reviewed the findings of this today. 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 Right iliac system was widely patent.  Failing left below-knee amputation.      Hospital Course:  [name] is a 53 y.o. female is S/P Left Procedure(s): Left AMPUTATION ABOVE KNEE Once her pain was manageable she was discharged home  07/10/2014.  Her incision was healing well and she was in stable condition.   Consults:  Treatment Team:  Maree Krabbeobert D Schertz, MD  Significant Diagnostic Studies: CBC Lab Results  Component Value Date   WBC 9.1 07/09/2014   HGB 12.2 07/09/2014   HCT 37.8 07/09/2014   MCV 89.2 07/09/2014   PLT 241 07/09/2014    BMET    Component Value Date/Time   NA 136* 07/09/2014 0432   K 4.2 07/09/2014 0432   CL 89* 07/09/2014 0432   CO2 25 07/09/2014 0432   GLUCOSE 97 07/09/2014 0432   BUN 37* 07/09/2014 0432   CREATININE 6.02* 07/09/2014 0432   CALCIUM 10.2 07/09/2014 0432   CALCIUM 7.5* 07/23/2010 1824   GFRNONAA 7* 07/09/2014 0432   GFRAA 8* 07/09/2014 0432   COAG Lab Results  Component Value Date   INR 1.15 05/24/2010     Disposition:  Discharge to :Home Discharge Instructions    Call MD for:  redness, tenderness, or signs of infection (pain, swelling, bleeding, redness, odor or green/yellow discharge around incision site)    Complete by:  As directed      Call MD for:  severe or increased pain, loss or decreased feeling  in affected limb(s)    Complete by:  As directed      Call MD for:  temperature >100.5    Complete by:  As directed      Discharge instructions    Complete by:  As directed  You may shower daily.     Increase activity slowly    Complete by:  As directed   Walk with assistance use walker or cane as needed     Resume previous diet    Complete by:  As directed             Medication List    TAKE these medications        BENICAR 40 MG tablet  Generic drug:  olmesartan  Take 40 mg by mouth daily.     carvedilol 25 MG tablet  Commonly known as:  COREG  Take 25 mg by mouth 2 (two) times daily with a meal.     insulin glargine 100 UNIT/ML injection  Commonly known as:  LANTUS  Inject 16 Units into the skin at bedtime.     sevelamer carbonate 800 MG tablet  Commonly known as:  RENVELA  Take 800-1,600 mg by mouth See admin instructions. Take  1600mg  with meals and 800mg  with snacks     traMADol 50 MG tablet  Commonly known as:  ULTRAM  Take 1 tablet (50 mg total) by mouth every 6 (six) hours.       Verbal and written Discharge instructions given to the patient. Wound care per Discharge AVS     Follow-up Information    Follow up with Sherren KernsFIELDS,CHARLES E, MD In 4 weeks.   Specialty:  Vascular Surgery   Why:  Office will call you to arrange your appt (sent)   Contact information:   [address] [place] [address] [phone]       Signed: Clinton GallantCOLLINS, Ayona Yniguez [facility] 07/15/2014, 1:45 PM

## 2014-07-25 ENCOUNTER — Emergency Department (HOSPITAL_COMMUNITY)
Admission: EM | Admit: 2014-07-25 | Discharge: 2014-07-25 | Disposition: A | Discharge status: Home / Self Care | Payer: Medicare Other | Attending: Emergency Medicine | Admitting: Emergency Medicine

## 2014-07-25 ENCOUNTER — Encounter (HOSPITAL_COMMUNITY): Payer: Self-pay

## 2014-07-25 ENCOUNTER — Emergency Department (HOSPITAL_COMMUNITY): Payer: Medicare Other

## 2014-07-25 DIAGNOSIS — J449 Chronic obstructive pulmonary disease, unspecified: Secondary | ICD-10-CM | POA: Insufficient documentation

## 2014-07-25 DIAGNOSIS — Z8701 Personal history of pneumonia (recurrent): Secondary | ICD-10-CM | POA: Insufficient documentation

## 2014-07-25 DIAGNOSIS — R011 Cardiac murmur, unspecified: Secondary | ICD-10-CM | POA: Insufficient documentation

## 2014-07-25 DIAGNOSIS — Z8719 Personal history of other diseases of the digestive system: Secondary | ICD-10-CM | POA: Insufficient documentation

## 2014-07-25 DIAGNOSIS — N186 End stage renal disease: Secondary | ICD-10-CM | POA: Insufficient documentation

## 2014-07-25 DIAGNOSIS — Z79899 Other long term (current) drug therapy: Secondary | ICD-10-CM | POA: Insufficient documentation

## 2014-07-25 DIAGNOSIS — Z86718 Personal history of other venous thrombosis and embolism: Secondary | ICD-10-CM | POA: Insufficient documentation

## 2014-07-25 DIAGNOSIS — R112 Nausea with vomiting, unspecified: Secondary | ICD-10-CM | POA: Insufficient documentation

## 2014-07-25 DIAGNOSIS — I2510 Atherosclerotic heart disease of native coronary artery without angina pectoris: Secondary | ICD-10-CM | POA: Insufficient documentation

## 2014-07-25 DIAGNOSIS — Z862 Personal history of diseases of the blood and blood-forming organs and certain disorders involving the immune mechanism: Secondary | ICD-10-CM | POA: Diagnosis not present

## 2014-07-25 DIAGNOSIS — G8918 Other acute postprocedural pain: Secondary | ICD-10-CM

## 2014-07-25 DIAGNOSIS — I5021 Acute systolic (congestive) heart failure: Secondary | ICD-10-CM | POA: Insufficient documentation

## 2014-07-25 DIAGNOSIS — I120 Hypertensive chronic kidney disease with stage 5 chronic kidney disease or end stage renal disease: Secondary | ICD-10-CM | POA: Diagnosis not present

## 2014-07-25 DIAGNOSIS — E119 Type 2 diabetes mellitus without complications: Secondary | ICD-10-CM | POA: Diagnosis not present

## 2014-07-25 DIAGNOSIS — M79605 Pain in left leg: Secondary | ICD-10-CM | POA: Insufficient documentation

## 2014-07-25 LAB — CBC WITH DIFFERENTIAL/PLATELET
Basophils Absolute: 0 K/uL (ref 0.0–0.1)
Basophils Relative: 0 % (ref 0–1)
Eosinophils Absolute: 0.1 K/uL (ref 0.0–0.7)
Eosinophils Relative: 0 % (ref 0–5)
HCT: 32.9 % — ABNORMAL LOW (ref 36.0–46.0)
Hemoglobin: 10.5 g/dL — ABNORMAL LOW (ref 12.0–15.0)
Lymphocytes Relative: 5 % — ABNORMAL LOW (ref 12–46)
Lymphs Abs: 0.7 K/uL (ref 0.7–4.0)
MCH: 28 pg (ref 26.0–34.0)
MCHC: 31.9 g/dL (ref 30.0–36.0)
MCV: 87.7 fL (ref 78.0–100.0)
Monocytes Absolute: 0.9 K/uL (ref 0.1–1.0)
Monocytes Relative: 6 % (ref 3–12)
Neutro Abs: 12.8 K/uL — ABNORMAL HIGH (ref 1.7–7.7)
Neutrophils Relative %: 89 % — ABNORMAL HIGH (ref 43–77)
Platelets: 269 K/uL (ref 150–400)
RBC: 3.75 MIL/uL — ABNORMAL LOW (ref 3.87–5.11)
RDW: 15.9 % — ABNORMAL HIGH (ref 11.5–15.5)
WBC: 14.4 K/uL — ABNORMAL HIGH (ref 4.0–10.5)

## 2014-07-25 LAB — COMPREHENSIVE METABOLIC PANEL WITH GFR
ALT: 10 U/L (ref 0–35)
AST: 14 U/L (ref 0–37)
Albumin: 3.3 g/dL — ABNORMAL LOW (ref 3.5–5.2)
Alkaline Phosphatase: 117 U/L (ref 39–117)
Anion gap: 25 — ABNORMAL HIGH (ref 5–15)
BUN: 40 mg/dL — ABNORMAL HIGH (ref 6–23)
CO2: 26 meq/L (ref 19–32)
Calcium: 10.4 mg/dL (ref 8.4–10.5)
Chloride: 85 meq/L — ABNORMAL LOW (ref 96–112)
Creatinine, Ser: 6.31 mg/dL — ABNORMAL HIGH (ref 0.50–1.10)
GFR calc Af Amer: 8 mL/min — ABNORMAL LOW
GFR calc non Af Amer: 7 mL/min — ABNORMAL LOW
Glucose, Bld: 167 mg/dL — ABNORMAL HIGH (ref 70–99)
Potassium: 4.1 meq/L (ref 3.7–5.3)
Sodium: 136 meq/L — ABNORMAL LOW (ref 137–147)
Total Bilirubin: 0.4 mg/dL (ref 0.3–1.2)
Total Protein: 8 g/dL (ref 6.0–8.3)

## 2014-07-25 MED ORDER — MORPHINE SULFATE 4 MG/ML IJ SOLN
4.0000 mg | Freq: Once | INTRAMUSCULAR | Status: AC
  Administered 2014-07-25: 4 mg via INTRAVENOUS
  Filled 2014-07-25: qty 1

## 2014-07-25 MED ORDER — HYDROMORPHONE HCL 2 MG PO TABS: 2.0000 mg | ORAL_TABLET | Freq: Four times a day (QID) | ORAL | Status: DC | PRN

## 2014-07-25 MED ORDER — HYDROCODONE-ACETAMINOPHEN 5-325 MG PO TABS: 1.0000 | ORAL_TABLET | ORAL | Status: DC | PRN

## 2014-07-25 MED ORDER — CEPHALEXIN 500 MG PO CAPS: 500.0000 mg | ORAL_CAPSULE | Freq: Every day | ORAL | Status: DC

## 2014-07-25 MED ORDER — ONDANSETRON 4 MG PO TBDP
4.0000 mg | ORAL_TABLET | Freq: Once | ORAL | Status: AC
  Administered 2014-07-25: 4 mg via ORAL
  Filled 2014-07-25: qty 1

## 2014-07-25 MED ORDER — MORPHINE SULFATE 4 MG/ML IJ SOLN
4.0000 mg | Freq: Once | INTRAMUSCULAR | Status: AC
  Administered 2014-07-25: 4 mg via INTRAMUSCULAR
  Filled 2014-07-25: qty 1

## 2014-07-25 NOTE — Discharge Instructions (Signed)
Call and make appointment to follow-up with Dr. Darrick PennaFields. Return immediately for worsening pain, warmth, redness, fever or for any concerns.

## 2014-07-25 NOTE — Progress Notes (Signed)
CT changed to without contrast- Exam completed @ [phone]

## 2014-07-25 NOTE — ED Notes (Signed)
Phlebotomy at the bedside  

## 2014-07-25 NOTE — ED Provider Notes (Signed)
  Physical Exam  BP 156/65 mmHg  Pulse 85  Temp(Src) 98.1 F (36.7 C) (Oral)  Resp 17  SpO2 97%  Physical Exam  ED Course  Procedures  MDM Patient has been seen by vascular surgery. Recommended Keflex and Dilaudid and will discharge home      Juliet Rudeathan R. Rubin PayorPickering, MD 07/25/14 [phone]

## 2014-07-25 NOTE — ED Provider Notes (Signed)
CSN: [id number]     Arrival date & time 07/25/14  0241 History  This chart was scribed for Loren Racer, MD by Roxy Cedar, ED Scribe. This patient was seen in room A03C/A03C and the patient's care was started at 3:17 AM.   Chief Complaint  Patient presents with  . Leg Pain   Patient is a 53 y.o. female presenting with leg pain. The history is provided by the patient. No language interpreter was used.  Leg Pain Location:  Leg Time since incident:  2 days Leg location:  L leg Pain details:    Quality:  Aching and throbbing   Radiates to:  Does not radiate   Severity:  Moderate   Onset quality:  Gradual   Duration:  2 days   Timing:  Constant   Progression:  Worsening Chronicity:  New Dislocation: no   Foreign body present:  No foreign bodies Relieved by:  Nothing Worsened by:  Activity Ineffective treatments: tramadol. Associated symptoms: no back pain, no fever and no neck pain      HPI Comments: [name] is a 53 y.o. female who presents to the Emergency Department complaining of gradually worsening left leg pain related to a left above the knee amputation performed 3 weeks ago. Patient states that surgery was done by Dr. Darrick Penna. She has been taking Tramadol for pain with minimal relief. She states that she woke up yesterday morning due to increased pain. She reports onset of associated decreased appetite and multiple episodes of emesis throughout the day yesterday. Per daughter, patient has an appointment for staple removal in 1.5 weeks. No fever or chills.  Past Medical History  Diagnosis Date  . Hypertension   . Anemia   . Systolic CHF, acute on chronic   . Coronary artery disease   . Peripheral vascular disease   . CHF (congestive heart failure)   . Angina   . Blood transfusion ~2003; 08/12/2012    "when I started on dialysis; lower gi bleeding" (08/12/2012)  . Hypercholesteremia   . DVT of leg (deep venous thrombosis) 2011    "RLE" (08/12/2012)  . Type  II diabetes mellitus   . Acute lower GI bleeding 08/12/2012  . Heart murmur     some say yes , some say no  . Shortness of breath 01/02/12    "@rest ; lying down; w/exertion"; no change 08/12/2012  . COPD (chronic obstructive pulmonary disease)   . Pneumonia     Dec 2013  . Sickle cell trait   . ESRD on dialysis     "[place] hemod[facility] 4 times aweek   Past Surgical History  Procedure Laterality Date  . 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 Av fistula placement  ~ 2003    left upper arm  . Abdominal hysterectomy  1990's  . Arteriovenous graft placement  2011    left upper arm  . Vascular surgery    . Cardiac catheterization  ~ 2010  . Cesarean section  1988  . Esophagogastroduodenoscopy  08/12/2012    Procedure: ESOPHAGOGASTRODUODENOSCOPY (EGD);  Gartrell: Vertell Novak., MD;  Location[facility] END[facility]COPY;  Service: Endoscopy;  Laterality: N/A;  . Colonoscopy  08/13/2012    Procedure: COLONOSCOPY;  Stang: Vertell Novak., MD;  Location: [facility] ENDOSCOPY;  Service: Endoscopy;  Laterality: N/A;  . 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 Insertion of dialysis catheter N/A 03/19/2013    Procedure: INSERTION OF DIALYSIS CATHETER;  Taves: Fransisco HertzBrian L Chen, MD;  [facility];  Service: Vascular;  Laterality: N/A;  Right Internal Jugular Placement  . Revision of arteriovenous goretex graft Left 03/19/2013    Procedure: REVISION OF ARTERIOVENOUS GORETEX GRAFT;  Arch: Fransisco HertzBrian L Chen, MD;  [facility];  Service: Vascular;  Laterality: Left;  using 6mm x 20cm Gore-Tex Graft  . Amputation Left 07/06/2014    Procedure: Left AMPUTATION ABOVE KNEE;  Ventura: Sherren Kernsharles E Fields, MD;  [facility];  Service: Vascular;  Laterality: Left;    Family History  Problem Relation Age of Onset  . Breast cancer Sister   . Breast cancer Sister   . Kidney failure Mother   . Diabetes Mother   . Cancer - Other Father    History  Substance Use Topics  . Smoking status: Never Smoker   . Smokeless tobacco: Never Used  . Alcohol Use: No   OB History    No data available     Review of Systems  Constitutional: Negative for fever and chills.  Respiratory: Negative for cough and shortness of breath.   Cardiovascular: Negative for chest pain.  Gastrointestinal: Positive for nausea and vomiting. Negative for abdominal pain and diarrhea.  Musculoskeletal: Negative for myalgias, back pain, neck pain and neck stiffness.  Skin: Negative for rash and wound.  Neurological: Negative for dizziness, numbness and headaches.  All other systems reviewed and are negative.  Allergies  Oxycodone  Home Medications   Prior to Admission medications   Medication Sig Start Date End Date Taking? Authorizing Provider  BENICAR 40 MG tablet Take 40 mg by mouth daily.  01/06/13  Yes Historical Provider, MD  carvedilol (COREG) 25 MG tablet Take 25 mg by mouth 2 (two) times daily with a meal.   Yes Historical Provider, MD  insulin glargine (LANTUS) 100 UNIT/ML injection Inject 16 Units into the skin at bedtime. 08/14/12  Yes Penny Piarlando Vega, MD  sevelamer carbonate (RENVELA) 800 MG tablet Take 800-1,600 mg by mouth See admin instructions. Take 1600mg  with meals and 800mg  with snacks   Yes Historical Provider, MD  traMADol (ULTRAM) 50 MG tablet Take 1 tablet (50 mg total) by mouth every 6 (six) hours. 07/10/14  Yes Lars MageEmma M Collins, PA-C   Triage Vitals: BP 178/93 mmHg  Pulse 88  Temp(Src) 97.9 F (36.6 C) (Oral)  Resp 22  SpO2 100%  Physical Exam  Constitutional: She is oriented to person, place, and time. She appears well-developed and well-nourished.  Tearful  HENT:  Head: Normocephalic and atraumatic.  Mouth/Throat: Oropharynx is clear and moist.   Eyes: EOM are normal. Pupils are equal, round, and reactive to light.  Neck: Normal range of motion. Neck supple.  Cardiovascular: Normal rate and regular rhythm.   Pulmonary/Chest: Effort normal and breath sounds normal. No respiratory distress. She has no wheezes. She has no rales.  Abdominal: Soft. Bowel sounds are normal. She exhibits no distension and no mass. There is no tenderness. There is no rebound and no guarding.  Musculoskeletal: Normal range of motion. She exhibits tenderness. She exhibits no edema.  Patient refusing exam of the left above the knee amputation. Surgical wound is intact. There is no obvious discharge. No redness. Diffusely tender.  Neurological: She is alert and  oriented to person, place, and time.  Skin: Skin is warm and dry. No rash noted. No erythema.  Psychiatric: She has a normal mood and affect. Her behavior is normal.  Nursing note and vitals reviewed.   ED Course  Procedures (including critical care time)  DIAGNOSTIC STUDIES: Oxygen Saturation is 100% on RA, normal by my interpretation.    COORDINATION OF CARE: 3:22 AM- Discussed with patient that the pain is a part of the healing process. Will give patient medication for pain management. Advised patient to follow up with Dr. Darrick PennaFields. Pt advised of plan for treatment and pt agrees.  Labs Review Labs Reviewed - No data to display  Imaging Review No results found.   EKG Interpretation None     MDM   Final diagnoses:  None    Pain is improved after initial IV narcotic given. Mild elevation in white blood cell count. Pain appears portion to exam. Imaging to assure no abscess present or other evidence of infection.  I personally performed the services described in this documentation, which was scribed in my presence. The recorded information has been reviewed and is accurate.  Dr. Durwin Noraixon will see patient in the emergency department and disposition.  Loren Raceravid Doloros Kwolek, MD 07/25/14 [phone]

## 2014-07-25 NOTE — Consult Note (Signed)
[facility]  Patient name: [name] MRN: [id number] DOB: 03-Apr-1961 Sex: female  REASON FOR CONSULT: Pain in left above the knee amputation site.  HPI: [name] is a 53 y.o. female who underwent a left above-the-knee amputation by Dr. Darrick PennaFields on 07/06/14. She was ultimately discharged home but has continued to have pain in her left above-the-knee amputation site. She was given Ultram on discharge but this made her sick to her stomach. She is allergic to oxycodone. She denies fever or chills. She denies any significant drainage from the amputation site.  Past Medical History  Diagnosis Date  . Hypertension   . Anemia   . Systolic CHF, acute on chronic   . Coronary artery disease   . Peripheral vascular disease   . CHF (congestive heart failure)   . Angina   . Blood transfusion ~2003; 08/12/2012    "when I started on dialysis; lower gi bleeding" (08/12/2012)  . Hypercholesteremia   . DVT of leg (deep venous thrombosis) 2011    "RLE" (08/12/2012)  . Type II diabetes mellitus   . Acute lower GI bleeding 08/12/2012  . Heart murmur     some say yes , some say no  . Shortness of breath 01/02/12    "@rest ; lying down; w/exertion"; no change 08/12/2012  . COPD (chronic obstructive pulmonary disease)   . Pneumonia     Dec 2013  . Sickle cell trait   . [place]D [place]     [place] [facility]   Allergies  Allergen Reactions  . Oxycodone Nausea And Vomiting    No current facility-administered medications for this encounter.   Current Outpatient Prescriptions  Medication Sig Dispense Refill  . BENICAR 40 MG tablet Take 40 mg by mouth daily.     . carvedilol (COREG) 25 MG tablet Take 25 mg by mouth 2 (two) times daily with a meal.    . insulin glargine (LANTUS) 100 UNIT/ML injection Inject 16 Units into the skin at bedtime. 10 mL 0  . sevelamer carbonate (RENVELA) 800 MG tablet Take 800-1,600 mg by mouth See admin  instructions. Take 1600mg  with meals and 800mg  with snacks    . traMADol (ULTRAM) 50 MG tablet Take 1 tablet (50 mg total) by mouth every 6 (six) hours. 30 tablet 0  . HYDROcodone-acetaminophen (NORCO) 5-325 MG per tablet Take 1-2 tablets by mouth every 4 (four) hours as needed for severe pain. 20 tablet 0    REVIEW OF SYSTEMS: Arly.Keller[X ] denotes positive finding; [  ] denotes negative finding INTEGUMENTARY:  [ ]  rashes  [ ]  ulcers CONSTITUTIONAL:  [ ]  fever   [ ]  chills  PHYSICAL EXAM: Filed Vitals:   07/25/14 0253 07/25/14 0458 07/25/14 0749  BP: 178/93 164/81 171/81  Pulse: 88 85 87  Temp: 97.9 F (36.6 C)    TempSrc: Oral    Resp: 22 22 23   SpO2: 100% 93% 98%   There is no weight on file to calculate BMI. GENERAL: The patient is a well-nourished female, in no acute distress. The vital signs are documented above. Her left above-the-knee amputation site appears to be healing adequately. Currently there is no significant drainage. There is some slight darkening of the skin on the medial corner of the amputation site and the lateral aspect as a small amount of scab. There is no significant drainage.  DATA:  Lab Results  Component Value Date   WBC 14.4* 07/25/2014  HGB 10.5* 07/25/2014   HCT 32.9* 07/25/2014   MCV 87.7 07/25/2014   PLT 269 07/25/2014   Lab Results  Component Value Date   HGBA1C 8.1* 07/03/2014   CT OF THE FEMUR: I reviewed her CT scan that was ordered by the emergency department. There is some fluid in the deep aspect of the amputation site however I would expect this to be normal given that she is only 3 weeks out from an above-the-knee amputation. There is no air to suggest infection.  MEDICAL ISSUES: STATUS POST LEFT ABOVE-THE-KNEE AMPUTATION: I think the amputation site is healing adequately. I do not see any staples that need to be removed or any obvious signs of infection. Given that she does have some fluid on the CT scan, and a mildly elevated white blood  cell count, I would put her on po Keflex for 10 days to 2 weeks. In terms of her pain control, I would consider po Dilaudid. I will arrange for her to follow up with Dr. Darrick PennaFields in 1-2 weeks.  DICKSON,CHRISTOPHER S [facility] [place]per: [phone]

## 2014-07-25 NOTE — ED Notes (Signed)
Pt had an L AKA ~ 3 weeks ago. Began experiencing increased pain in left leg 2 days ago. No obvious signs of bleeding, drainage, or discharge.

## 2014-07-25 NOTE — ED Notes (Signed)
Pt is from home with daughter

## 2014-07-25 NOTE — Progress Notes (Signed)
Patient has order for CT with contrast but does not have PIV at this time. Fayrene FearingJames RN aware @ [phone] that CT is just waiting for IV start

## 2014-07-27 ENCOUNTER — Telehealth: Payer: Self-pay | Admitting: General Practice

## 2014-07-27 NOTE — Telephone Encounter (Addendum)
-----   Message from Sharee PimpleMarilyn K McChesney, RN sent at 07/27/2014  9:56 AM EST ----- Regarding: Schedule I think she already has an appt on 08-06-14  ----- Message -----    From: Chuck Hinthristopher S Dickson, MD    Sent: 07/25/2014   8:22 AM      To: [facility] Charge Pool Subject: consult                                        I saw this patient in the emergency department because of pain at her above-the-knee amputation site. The emergency room physician is can put her on Keflex and Dilaudid for pain. If she does not already have an appointment, she needs an appointment with Dr. Darrick PennaFields in 1-2 weeks. Thank you. CD. Given that she had surgery 3 weeks ago, I'm not sure that there is a charge for this.    07/06/14: patient is already scheduled for 08/06/14- dpm

## 2014-07-28 ENCOUNTER — Emergency Department (HOSPITAL_COMMUNITY)
Admission: EM | Admit: 2014-07-28 | Discharge: 2014-07-28 | Disposition: A | Discharge status: Home / Self Care | Payer: Medicare Other | Attending: Emergency Medicine | Admitting: Emergency Medicine

## 2014-07-28 ENCOUNTER — Encounter (HOSPITAL_COMMUNITY): Payer: Self-pay | Admitting: Emergency Medicine

## 2014-07-28 ENCOUNTER — Other Ambulatory Visit: Payer: Self-pay

## 2014-07-28 DIAGNOSIS — J449 Chronic obstructive pulmonary disease, unspecified: Secondary | ICD-10-CM | POA: Insufficient documentation

## 2014-07-28 DIAGNOSIS — Z79899 Other long term (current) drug therapy: Secondary | ICD-10-CM | POA: Diagnosis not present

## 2014-07-28 DIAGNOSIS — M79605 Pain in left leg: Secondary | ICD-10-CM | POA: Insufficient documentation

## 2014-07-28 DIAGNOSIS — R5383 Other fatigue: Secondary | ICD-10-CM | POA: Insufficient documentation

## 2014-07-28 DIAGNOSIS — Z794 Long term (current) use of insulin: Secondary | ICD-10-CM | POA: Diagnosis not present

## 2014-07-28 DIAGNOSIS — R011 Cardiac murmur, unspecified: Secondary | ICD-10-CM | POA: Insufficient documentation

## 2014-07-28 DIAGNOSIS — Z86718 Personal history of other venous thrombosis and embolism: Secondary | ICD-10-CM | POA: Insufficient documentation

## 2014-07-28 DIAGNOSIS — Z792 Long term (current) use of antibiotics: Secondary | ICD-10-CM | POA: Diagnosis not present

## 2014-07-28 DIAGNOSIS — E119 Type 2 diabetes mellitus without complications: Secondary | ICD-10-CM | POA: Insufficient documentation

## 2014-07-28 DIAGNOSIS — I120 Hypertensive chronic kidney disease with stage 5 chronic kidney disease or end stage renal disease: Secondary | ICD-10-CM | POA: Insufficient documentation

## 2014-07-28 DIAGNOSIS — Z862 Personal history of diseases of the blood and blood-forming organs and certain disorders involving the immune mechanism: Secondary | ICD-10-CM | POA: Diagnosis not present

## 2014-07-28 DIAGNOSIS — R4182 Altered mental status, unspecified: Secondary | ICD-10-CM | POA: Diagnosis not present

## 2014-07-28 DIAGNOSIS — Z992 Dependence on renal dialysis: Secondary | ICD-10-CM | POA: Diagnosis not present

## 2014-07-28 DIAGNOSIS — I5023 Acute on chronic systolic (congestive) heart failure: Secondary | ICD-10-CM | POA: Diagnosis not present

## 2014-07-28 DIAGNOSIS — Z8719 Personal history of other diseases of the digestive system: Secondary | ICD-10-CM | POA: Diagnosis not present

## 2014-07-28 DIAGNOSIS — I2510 Atherosclerotic heart disease of native coronary artery without angina pectoris: Secondary | ICD-10-CM | POA: Diagnosis not present

## 2014-07-28 DIAGNOSIS — Z89612 Acquired absence of left leg above knee: Secondary | ICD-10-CM | POA: Insufficient documentation

## 2014-07-28 DIAGNOSIS — N186 End stage renal disease: Secondary | ICD-10-CM | POA: Diagnosis not present

## 2014-07-28 DIAGNOSIS — Z9889 Other specified postprocedural states: Secondary | ICD-10-CM | POA: Diagnosis not present

## 2014-07-28 DIAGNOSIS — Z8701 Personal history of pneumonia (recurrent): Secondary | ICD-10-CM | POA: Diagnosis not present

## 2014-07-28 LAB — CBC WITH DIFFERENTIAL/PLATELET
Basophils Absolute: 0 10*3/uL (ref 0.0–0.1)
Basophils Relative: 0 % (ref 0–1)
EOS ABS: 0.1 10*3/uL (ref 0.0–0.7)
EOS PCT: 1 % (ref 0–5)
HCT: 31.3 % — ABNORMAL LOW (ref 36.0–46.0)
Hemoglobin: 10.2 g/dL — ABNORMAL LOW (ref 12.0–15.0)
LYMPHS ABS: 0.6 10*3/uL — AB (ref 0.7–4.0)
Lymphocytes Relative: 5 % — ABNORMAL LOW (ref 12–46)
MCH: 27.6 pg (ref 26.0–34.0)
MCHC: 32.6 g/dL (ref 30.0–36.0)
MCV: 84.8 fL (ref 78.0–100.0)
Monocytes Absolute: 0.8 10*3/uL (ref 0.1–1.0)
Monocytes Relative: 7 % (ref 3–12)
Neutro Abs: 9.7 10*3/uL — ABNORMAL HIGH (ref 1.7–7.7)
Neutrophils Relative %: 87 % — ABNORMAL HIGH (ref 43–77)
Platelets: 221 10*3/uL (ref 150–400)
RBC: 3.69 MIL/uL — ABNORMAL LOW (ref 3.87–5.11)
RDW: 16.1 % — ABNORMAL HIGH (ref 11.5–15.5)
WBC: 11.2 10*3/uL — ABNORMAL HIGH (ref 4.0–10.5)

## 2014-07-28 LAB — BASIC METABOLIC PANEL
Anion gap: 21 — ABNORMAL HIGH (ref 5–15)
BUN: 29 mg/dL — AB (ref 6–23)
CALCIUM: 9.7 mg/dL (ref 8.4–10.5)
CO2: 25 mEq/L (ref 19–32)
CREATININE: 3.45 mg/dL — AB (ref 0.50–1.10)
Chloride: 89 mEq/L — ABNORMAL LOW (ref 96–112)
GFR, EST AFRICAN AMERICAN: 16 mL/min — AB (ref >90)
GFR, EST NON AFRICAN AMERICAN: 14 mL/min — AB (ref >90)
GLUCOSE: 189 mg/dL — AB (ref 70–99)
Potassium: 3.7 mEq/L (ref 3.7–5.3)
Sodium: 135 mEq/L — ABNORMAL LOW (ref 137–147)

## 2014-07-28 LAB — I-STAT CG4 LACTIC ACID, ED: Lactic Acid, Venous: 3.01 mmol/L — ABNORMAL HIGH (ref 0.5–2.2)

## 2014-07-28 LAB — CBG MONITORING, ED: Glucose-Capillary: 176 mg/dL — ABNORMAL HIGH (ref 70–99)

## 2014-07-28 MED ORDER — ONDANSETRON HCL 4 MG/2ML IJ SOLN
4.0000 mg | Freq: Once | INTRAMUSCULAR | Status: AC
  Administered 2014-07-28: 4 mg via INTRAVENOUS
  Filled 2014-07-28: qty 2

## 2014-07-28 MED ORDER — SODIUM CHLORIDE 0.9 % IV BOLUS (SEPSIS)
250.0000 mL | Freq: Once | INTRAVENOUS | Status: AC
  Administered 2014-07-28: 250 mL via INTRAVENOUS

## 2014-07-28 MED ORDER — FENTANYL CITRATE 0.05 MG/ML IJ SOLN
50.0000 ug | INTRAMUSCULAR | Status: DC | PRN
  Administered 2014-07-28 (×3): 50 ug via INTRAVENOUS
  Filled 2014-07-28 (×3): qty 2

## 2014-07-28 NOTE — Discharge Instructions (Signed)

## 2014-07-28 NOTE — ED Provider Notes (Signed)
CSN: [id number]     Arrival date & time 07/28/14  1504 History   First MD Initiated Contact with Patient 07/28/14 1557     Chief Complaint  Patient presents with  . Leg Pain   HPI  Patient is a [age] year old female with past medical history of end-stage renal disease on dialysis, hypertension, anemia, CHF, CAD, diabetes mellitus, and COPD who presents to the emergency room via EMS from dialysis for evaluation of left leg pain in her left knee. Patient is 3 weeks status post above-the-knee agitation of her left leg. Operation was performed by Dr. Darrick Pennafields. Patient was recently seen in the emergency room 3 days ago and started on Keflex and Dilaudid for possible infection of the surgical site. Patient was given last dose of Dilaudid yesterday morning. Daughter notes that since yesterday she has been in and out of her normal state of consciousness. She has seemed excessively sleepy and somnolent. Patient has not had any further pain medicine at this time. Patient went to dialysis today and was only able to tolerate 2 and half hours of her dialysis due to left leg pain. Per the daughter at the dialysis center called her and told her that there was significant bleeding from the lateral edge of the wound. Her nephrologist told her daughter that she thought the wound was likely infected and patient received an infusion of vancomycin and a minimal amount of Fortaz with her dialysis. Patient arrived here with her dialysis graft access, so no further Elita QuickFortaz has been given at this time. Daughter states that the wound continues to drain bloody and serosanguineous fluids. Patient said that this started on Thursday and has been getting worse. Prior to Thursday the wound had been healing very well. Patient's daughter states that she has been eating and drinking, but this is decreased from her baseline. Patient has been taking Keflex as prescribed at home. Daughter also notes a new blister that has popped up in the left medial  thigh seemingly overnight. Patient's daughter denies fevers, chills, nausea, vomiting, abdominal pain, cough, increased work of breathing, diarrhea.   Past Medical History  Diagnosis Date  . Hypertension   . Anemia   . Systolic CHF, acute on chronic   . Coronary artery disease   . Peripheral vascular disease   . CHF (congestive heart failure)   . Angina   . Blood transfusion ~2003; 08/12/2012    "when I started on dialysis; lower gi bleeding" (08/12/2012)  . Hypercholesteremia   . DVT of leg (deep venous thrombosis) 2011    "RLE" (08/12/2012)  . Type II diabetes mellitus   . Acute lower GI bleeding 08/12/2012  . Heart murmur     some say yes , some say no  . Shortness of breath 01/02/12    "@rest ; lying down; w/exertion"; no change 08/12/2012  . COPD (chronic obstructive pulmonary disease)   . Pneumonia     Dec 2013  . Sickle cell trait   . ESRD on dialysis     [facility]   Past Surgical History  Procedure Laterality Date  . 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 Av fistula placement  ~ 2003    left upper arm  . Abdominal hysterectomy  1990's  . Arteriovenous graft placement  2011    left upper arm  . Vascular surgery    . Cardiac  catheterization  ~ 2010  . Cesarean section  1988  . Esophagogastroduodenoscopy  08/12/2012    Procedure: ESOPHAGOGASTRODUODENOSCOPY (EGD);  Dimattia: Vertell Novak., MD;  Location: [facility] ENDOSCOPY;  Service: Endoscopy;  Laterality: N/A;  . Colonoscopy  08/13/2012    Procedure: COLONOSCOPY;  Hable: Vertell Novak., MD;  Location[facility] END[facility]COPY;  Service: Endoscopy;  Laterality: N/A;  . 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 Insertion of dialysis catheter N/A  03/19/2013    Procedure: INSERTION OF DIALYSIS CATHETER;  Diana: Fransisco Hertz, MD;  Location: [facility] OR;  Service: Vascular;  Laterality: N/A;  Right Internal Jugular Placement  . Revision of arteriovenous goretex graft Left 03/19/2013    Procedure: REVISION OF ARTERIOVENOUS GORETEX GRAFT;  Hannig: Fransisco Hertz, MD;  Location: [facility] OR;  Service: Vascular;  Laterality: Left;  using 6mm x 20cm Gore-Tex Graft  . Amputation Left 07/06/2014    Procedure: Left AMPUTATION ABOVE KNEE;  Hoecker: Sherren Kerns, MD;  Location: [facility] OR;  Service: Vascular;  Laterality: Left;   Family History  Problem Relation Age of Onset  . Breast cancer Sister   . Breast cancer Sister   . Kidney failure Mother   . Diabetes Mother   . Cancer - Other Father    History  Substance Use Topics  . Smoking status: Never Smoker   . Smokeless tobacco: Never Used  . Alcohol Use: No   OB History    No data available     Review of Systems  Level V caveat secondary to altered mental status. See history of present illness for ROS provided by daughter.  Allergies  Oxycodone  Home Medications   Prior to Admission medications   Medication Sig Start Date End Date Taking? Authorizing Provider  BENICAR 40 MG tablet Take 40 mg by mouth daily.  01/06/13  Yes Historical Provider, MD  carvedilol (COREG) 25 MG tablet Take 25 mg by mouth 2 (two) times daily with a meal.   Yes Historical Provider, MD  cephALEXin (KEFLEX) 500 MG capsule Take 1 capsule (500 mg total) by mouth daily. 07/25/14  Yes Nathan R. Pickering, MD  HYDROmorphone (DILAUDID) 2 MG tablet Take 1 tablet (2 mg total) by mouth every 6 (six) hours as needed for severe pain. 07/25/14  Yes Nathan R. Pickering, MD  insulin glargine (LANTUS) 100 UNIT/ML injection Inject 16 Units into the skin at bedtime. 08/14/12  Yes Penny Pia, MD  sevelamer carbonate (RENVELA) 800 MG tablet Take 800-1,600 mg by mouth See admin instructions. Take 1600mg  with meals and 800mg  with snacks    Yes Historical Provider, MD  traMADol (ULTRAM) 50 MG tablet Take 1 tablet (50 mg total) by mouth every 6 (six) hours. Patient not taking: Reported on 07/28/2014 07/10/14   Lars Mage, PA-C   BP 123/54 mmHg  Pulse 68  Temp(Src) 99 F (37.2 C) (Rectal)  Resp 23  Wt 119 lb 0.8 oz (54.001 kg)  SpO2 94% Physical Exam  Constitutional: She appears lethargic. She is cooperative.  Chronically ill-appearing  HENT:  Head: Normocephalic and atraumatic.  Mucous membranes pale pink and dry. No exudate.  Eyes: Conjunctivae and EOM are normal. Pupils are equal, round, and reactive to light. No scleral icterus.  Neck: Normal range of motion. Neck supple. No JVD present. No thyromegaly present.  Cardiovascular: Normal rate and regular rhythm.  Exam reveals no gallop and no friction rub.   Murmur heard. 2 out of 6 soft systolic murmur heard best on the left precordium.  Dialysis graft found on left upper arm. Graft is pulsatile with palpable thrill  Pulmonary/Chest: Effort normal and breath sounds normal. No respiratory distress. She has no wheezes. She has no rales. She exhibits no tenderness.  Abdominal: Soft. Bowel sounds are normal. She exhibits no distension and no mass. There is no tenderness. There is no rebound and no guarding.  Musculoskeletal: Normal range of motion.  Patient has bilateral above-the-knee amputations  Lymphadenopathy:    She has no cervical adenopathy.  Neurological: She appears lethargic. No sensory deficit.  Patient will only speak in yes or no form. Patient is able to follow simple commands, and will move all extremities 4.  Skin:  Surgical wound noted on the left stump from above-the-knee amputation. There are staples still in the wound. There is minimal dehiscence at lateral most aspect. Wound is not warm to palpation. There is no active drainage at this time. There is visible serosanguineous fluids on pad underneath leg. There is a 5 cm x 7 cm flesh-colored bulla found  on the medial left thigh.  Psychiatric: She has a normal mood and affect. Her behavior is normal. Judgment and thought content normal.  Nursing note and vitals reviewed.   ED Course  Procedures (including critical care time) Labs Review Labs Reviewed  BASIC METABOLIC PANEL - Abnormal; Notable for the following:    Sodium 135 (*)    Chloride 89 (*)    Glucose, Bld 189 (*)    BUN 29 (*)    Creatinine, Ser 3.45 (*)    GFR calc non Af Amer 14 (*)    GFR calc Af Amer 16 (*)    Anion gap 21 (*)    All other components within normal limits  CBC WITH DIFFERENTIAL - Abnormal; Notable for the following:    WBC 11.2 (*)    RBC 3.69 (*)    Hemoglobin 10.2 (*)    HCT 31.3 (*)    RDW 16.1 (*)    Neutrophils Relative % 87 (*)    Lymphocytes Relative 5 (*)    Neutro Abs 9.7 (*)    Lymphs Abs 0.6 (*)    All other components within normal limits  I-STAT CG4 LACTIC ACID, ED - Abnormal; Notable for the following:    Lactic Acid, Venous 3.01 (*)    All other components within normal limits  CBG MONITORING, ED - Abnormal; Notable for the following:    Glucose-Capillary 176 (*)    All other components within normal limits    Imaging Review No results found.   EKG Interpretation None      MDM   Final diagnoses:  Altered mental status  Pain of left lower extremity  Hx of AKA (above knee amputation), left   Patient is a 53 y.o. female who presents to the emergency room with concerns for left leg infection, leg pain, and per her daughter altered mental status. Physical exam reveals alert, nontoxic appearing female with stable vital signs. Patient will follow commands and is easily arousable. Examination of left above-the-knee amputation surgical site reveals no evidence for infection at this time. There is minimal drainage from the wound. There is minimal lateral dehiscence of the wound. I have spoken with Dr. Durwin Nora from vascular surgery who saw the patient on Saturday and felt at that  time  that the wound was not infected. Given my description of the wound he does not feel that he needs to come to see the patient in the emergency room. He feels that the patient needs to have better control of her pain with more frequent dosing of pain medication. He feels that the patient should be followed up in the office at this time. Cbg reveals normal blood sugar. BMP is at baseline. CBC shows down her trend of leukocytosis. Lactic acid is mildly elevated at this time suspect that this is likely due to dehydration. I have given 250 mL of normal saline at this time. Patient has been given 1 g of the vanc per her nephrologist.  I have advised the family that they should continue the Keflex until being seen in the office tomorrow. We have discussed the dosing of Dilaudid, and family would like to try a lower dose. If pain medication still seems to sedating patient and her family will speak with vascular Collazos about possible change in pain medication. Vision is to return for any concerning symptoms including bleeding from the wound, wound opening, fever. Patient and her family state understanding and agreement at this time. Patient has been seen by and discussed with Dr. Rennis ChrisJacobowitz who agrees with the above workup and plan. Patient is to follow-up with vascular surgery in the office tomorrow morning.   Eben Burowourtney A Forcucci, PA-C 07/28/14 1804  Doug SouSam Jacubowitz, MD 07/28/14 2150

## 2014-07-28 NOTE — ED Provider Notes (Signed)
15:45- she is here for evaluation of left leg pain which she feels in her left knee.  She is status post above-knee amputation, left leg, 3 weeks ago.  She was seen in the ED 3 days ago, and started on an antibiotic, and Dilaudid for pain.  Her last pain medication was yesterday morning.  She did not receive pain medicine during the day because of sedation.  Today, while on dialysis, she had severe left leg pain, so was transferred here.  She has had drainage from the left AKA surgical wound.  Exam- alert in severe pain.  She is crying continuously.  Vital signs are reassuring.  Left leg has a stapled wound with mild red discharge associated with some purulence.  Screening labs, ordered, pain medicine ordered.  Patient will be seen fully, by the oncoming provider team.  Flint MelterElliott L Roshunda Keir, MD 07/28/14 [phone]

## 2014-07-28 NOTE — ED Provider Notes (Signed)
Patient complains of increasing left leg pain at site of  left AKA amputation over the past 2-3 days with increased bloody drainage noted from surgical wound. Also noted was a blister on her left medial thigh noted this morning by daughter. No other complaint. No shortness of breath No fever no vomiting. Sent here from hemodialysis where she got intravenous antibiotics at dialysis. On exam patient is alert nontoxic lungs clear to auscultation heart regular rate and rhythm abdomen nondistended nontender left lower extremity with a stapled surgical wound at distal most aspect of AKA wound appears clean with  dehiscence at lateral most aspect approximately 3 cm slight bloody drainage on bandage. No pus noted.  Date: 07/28/2014  Rate: 70  Rhythm: normal sinus rhythm  QRS Axis: normal  Intervals: normal  ST/T Wave abnormalities: nonspecific T wave changes  Conduction Disutrbances:none  Narrative Interpretation:   Old EKG Reviewed: No significant change from 06/19/2014 interpreted by me   Doug SouSam Finnegan Gatta, MD 07/28/14 2150

## 2014-07-28 NOTE — ED Notes (Signed)
NOTIFIED C.FORCUCCI ,PA FOR PATIENTS PANIC LAB RESULTS OF CG4+ LACTIC ACID = 3.3501mmol/L @16 :55 PM ,07/28/2014.

## 2014-07-28 NOTE — ED Notes (Signed)
Iv team at bedside deaccessing graft

## 2014-07-28 NOTE — ED Notes (Signed)
Per ems, pt was at dialysis, received 2 hours of treatment and was unable to finish d/t pain from recently amputated left leg.  Pt arrives with fistula accessed w/fortaz 2mg  infusing, received 1.5 mg of vancomycin pta.

## 2014-07-29 ENCOUNTER — Encounter: Payer: Self-pay | Admitting: Family

## 2014-07-29 ENCOUNTER — Ambulatory Visit (INDEPENDENT_AMBULATORY_CARE_PROVIDER_SITE_OTHER): Payer: Self-pay | Admitting: Family

## 2014-07-29 VITALS — BP 127/67 | HR 70 | Temp 97.0°F | Resp 16 | Ht 60.0 in | Wt 120.0 lb

## 2014-07-29 DIAGNOSIS — S80822A Blister (nonthermal), left lower leg, initial encounter: Secondary | ICD-10-CM

## 2014-07-29 DIAGNOSIS — T148XXA Other injury of unspecified body region, initial encounter: Secondary | ICD-10-CM

## 2014-07-29 DIAGNOSIS — L24A9 Irritant contact dermatitis due friction or contact with other specified body fluids: Secondary | ICD-10-CM

## 2014-07-29 DIAGNOSIS — G546 Phantom limb syndrome with pain: Secondary | ICD-10-CM

## 2014-07-29 DIAGNOSIS — S80829A Blister (nonthermal), unspecified lower leg, initial encounter: Secondary | ICD-10-CM

## 2014-07-29 DIAGNOSIS — L089 Local infection of the skin and subcutaneous tissue, unspecified: Secondary | ICD-10-CM

## 2014-07-29 MED ORDER — GABAPENTIN 300 MG PO CAPS: 300.0000 mg | ORAL_CAPSULE | Freq: Two times a day (BID) | ORAL | Status: DC

## 2014-07-29 NOTE — Progress Notes (Signed)
Postoperative Visit   History of Present Illness  [name] is a 53 y.o. female on 03/19/13 she had the following performed by Dr Imogene Burn:  1. Right internal jugular vein tunneled dialysis catheter placement  2. Right internal jugular vein cannulation under ultrasound guidance  3. Excision of ruptured graft segment  4. Revision of left hybrid brachiocephalic arteriovenous fistula and upper arm graft with interposition graft  5. 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 She denies any problems with her left upper arm AVG, HD on M-W-F.  Dr. Darrick Penna performed a right AKA in 05/2010, a left BKA in 06/2010, and a left AKA 07/06/14 for severe ischemia in both legs.   She returns today with c/o left AKA stump wound draining, seen in ED twice in the last 4 days for this and pain in left thigh. Large blister at inner aspect left thigh formed yesterday per daughter. Daughter states pt denies any known injury. Dr. Rubin Payor prescribed Keflex on 11/21, daughter continues to give pt Keflex; daughter also states that pt was given an antibiotic in HD. Daughter is also giving pt prescribed hydromorphone, 1/2 of a 2 mg tablet, and pt is somnolent.   Her A1C October, 2015 was 8.1, uncontrolled DM. She is a non-smoker.  ROS: See HPI for pertinent positives and negatives.  Past Medical History  Diagnosis Date  . Hypertension   . Anemia   . Systolic CHF, acute on chronic   . Coronary artery disease   . Peripheral vascular disease   . CHF (congestive heart failure)   . Angina   . Blood transfusion ~2003; 08/12/2012    "when I started on dialysis; lower gi bleeding" (08/12/2012)  . Hypercholesteremia   . DVT of leg (deep venous thrombosis) 2011    "RLE" (08/12/2012)  . Type II diabetes mellitus   . Acute lower GI bleeding 08/12/2012  . Heart murmur     some say yes , some say no  . Shortness of breath 01/02/12     "@rest ; lying down; w/exertion"; no change 08/12/2012  . COPD (chronic obstructive pulmonary disease)   . Pneumonia     Dec 2013  . Sickle cell trait   . ESRD on dialysis     "[place] hemodialysis 4 times aweek   History   Social History  . Marital Status: Married    Spouse Name: N/A    Number of Children: N/A  . Years of Education: N/A   Occupational History  . Not on file.   Social History Main Topics  . Smoking status: Never Smoker   . Smokeless tobacco: Never Used  . Alcohol Use: No  . Drug Use: No  . Sexual Activity: Yes   Other Topics Concern  . Not on file   Social History Narrative    Past Surgical History  Procedure Laterality Date  . 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 Av fistula placement  ~ 2003    left upper arm  . Abdominal hysterectomy  1990's  . Arteriovenous graft placement  2011    left upper arm  . Vascular surgery    . Cardiac catheterization  ~ 2010  . Cesarean section  1988  . Esophagogastroduodenoscopy  08/12/2012    Procedure: ESOPHAGOGASTRODUODENOSCOPY (EGD);  Will: Vertell Novak., MD;  Location: [facility] ENDOSCOPY;  Service: Endoscopy;  Laterality: N/A;  . Colonoscopy  08/13/2012    Procedure: COLONOSCOPY;  Manlove: Vertell NovakJames L Edwards Jr., MD;  [facility];  Service: Endoscopy;  Laterality: N/A;  . Resection of arteriovenous fistula aneurysm Left 03/19/2013    Procedure: EXCISION OF LEFT BRACHIOCEPHALIC ARTERIOVENOUS FISTULA PSEUDOANEURYSM;  Ly: Fransisco HertzBrian L Chen, MD;  [facility];  Service: Vascular;  Laterality: Left;  . Insertion of dialysis catheter N/A 03/19/2013    Procedure: INSERTION OF DIALYSIS CATHETER;  Constable: Fransisco HertzBrian L Chen, MD;  [facility];  Service: Vascular;  Laterality: N/A;  Right Internal Jugular Placement  . Revision of arteriovenous goretex graft Left 03/19/2013    Procedure: REVISION OF ARTERIOVENOUS GORETEX GRAFT;  Burkitt: Fransisco HertzBrian L  Chen, MD;  [facility];  Service: Vascular;  Laterality: Left;  using 6mm x 20cm Gore-Tex Graft  . Amputation Left 07/06/2014    Procedure: Left AMPUTATION ABOVE KNEE;  Rogel: Sherren Kernsharles E Fields, MD;  [facility];  Service: Vascular;  Laterality: Left;   Family History  Problem Relation Age of Onset  . Breast cancer Sister   . Breast cancer Sister   . Kidney failure Mother   . Diabetes Mother   . Cancer - Other Father    Allergies  Allergen Reactions  . Oxycodone Nausea And Vomiting   Current Outpatient Prescriptions on File Prior to Visit  Medication Sig Dispense Refill  . BENICAR 40 MG tablet Take 40 mg by mouth daily.     . carvedilol (COREG) 25 MG tablet Take 25 mg by mouth 2 (two) times daily with a meal.    . cephALEXin (KEFLEX) 500 MG capsule Take 1 capsule (500 mg total) by mouth daily. 10 capsule 0  . HYDROmorphone (DILAUDID) 2 MG tablet Take 1 tablet (2 mg total) by mouth every 6 (six) hours as needed for severe pain. 15 tablet 0  . insulin glargine (LANTUS) 100 UNIT/ML injection Inject 16 Units into the skin at bedtime. 10 mL 0  . sevelamer carbonate (RENVELA) 800 MG tablet Take 800-1,600 mg by mouth See admin instructions. Take 1600mg  with meals and 800mg  with snacks    . traMADol (ULTRAM) 50 MG tablet Take 1 tablet (50 mg total) by mouth every 6 (six) hours. (Patient not taking: Reported on 07/28/2014) 30 tablet 0   No current facility-administered medications on file prior to visit.     For VQI Use Only   AMB STATUS: Wheelchair  Physical Examination  Filed Vitals:   07/29/14 1039  BP: 127/67  Pulse: 70  Temp: 97 F (36.1 C)  TempSrc: Oral  Resp: 16  Height: 5' (1.524 m)  Weight: 120 lb (54.432 kg)   Body mass index is 23.44 kg/(m^2).  General: The patient appears their stated age, seated in wheelchair.  HEENT: No gross abnormalities Pulmonary: Respirations are non-labored Abdomen: Soft and non-tender. Musculoskeletal:  Bilateral AKA's.  Three clear fluid filled blisters on the medial and posterior aspects of left thigh. Neurologic: No focal weakness or paresthesias are detected, Skin: There are no rashes noted. No ulcers on right LE stump, left AKA stump with staples, no erythema, minimal swelling at incision, incision edges are well proximated, no drainage from incision. See Muscoskeletal.  Psychiatric: The patient is somnolent, opens eyes to voice, follows simple commands. Cardiovascular: There is a regular rate and rhythm without significant murmur appreciated. Right femoral pulse is 2+ palpable, left femoral pulse is not palpable. Left upper arm AV graft has a palpable thrill.  Medical Decision Making  [name] is a 53 y.o. female who presents s/p right AKA in 05/2010, a left BKA in 06/2010, and a left AKA 07/06/14 for severe ischemia in both legs. Dr. Myra GianottiBrabham examined pt and spoke with pt's daughters, pt is somnolent. Three large serous filled blisters, likely tape reaction, pierced and allowed to drain, dressings applied; daughters instructed in wound care and daily dressing changes to left thigh. Gabapentin 300 mg bid started to address phantom pain. Advised patient's daughters to not give pt any more hydromorphone until it is determined how pt reacts to gabapentin since she is somnolent now. Finish antibiotic.   The patient will follow up with Dr. Darrick PennaFields as scheduled on 08/06/14.  [place] Silveria, Carma LairSUZANNE L, RN, MSN, FNP-C [facility] Office: [phone]  07/29/2014, 10:27 AM  Clinic MD: Myra GianottiBrabham

## 2014-08-05 ENCOUNTER — Encounter: Payer: Self-pay | Admitting: Vascular Surgery

## 2014-08-06 ENCOUNTER — Ambulatory Visit (INDEPENDENT_AMBULATORY_CARE_PROVIDER_SITE_OTHER): Payer: Self-pay | Admitting: Vascular Surgery

## 2014-08-06 ENCOUNTER — Encounter: Payer: Self-pay | Admitting: Vascular Surgery

## 2014-08-06 VITALS — BP 142/73 | HR 74 | Temp 97.2°F | Resp 18 | Ht 60.0 in | Wt 120.0 lb

## 2014-08-06 DIAGNOSIS — R238 Other skin changes: Secondary | ICD-10-CM

## 2014-08-06 DIAGNOSIS — L24A9 Irritant contact dermatitis due friction or contact with other specified body fluids: Secondary | ICD-10-CM

## 2014-08-06 DIAGNOSIS — T148 Other injury of unspecified body region: Secondary | ICD-10-CM

## 2014-08-06 DIAGNOSIS — T148XXA Other injury of unspecified body region, initial encounter: Secondary | ICD-10-CM

## 2014-08-06 MED ORDER — "XEROFORM PETROLAT GAUZE 5""X9"" EX MISC": 1.0000 "application " | Freq: Every morning | CUTANEOUS | Status: DC

## 2014-08-06 MED ORDER — TRAMADOL HCL 50 MG PO TABS: 50.0000 mg | ORAL_TABLET | Freq: Four times a day (QID) | ORAL | Status: DC

## 2014-08-06 NOTE — Progress Notes (Signed)
Patient is a 53 year old female who returns for follow-up today after a left above-knee amputation. She apparently developed blistering on the inner aspect of her left thigh sometime during the postoperative period. She was seen by our nurse practitioner and my partner Dr. Myra GianottiBrabham a few weeks ago. Several blisters were lanced. The patient has been somnolent intermittently. We are continuing to cut back on her narcotic pain medication.  Physical exam:  Filed Vitals:   08/06/14 1525  BP: 142/73  Pulse: 74  Temp: 97.2 F (36.2 C)  TempSrc: Oral  Resp: 18  Height: 5' (1.524 m)  Weight: 120 lb (54.432 kg)  SpO2: 98%    Left lower extremity: Superficial epidermal loss left inner thigh area 10 x 10 cm, incision line above-knee amputation healing overall.  Assessment: What appears to be a burn on the inner aspect of her left above-knee amputation. The  Plan: Staples removed from left above-knee amputation today. We will place Xeroform gauze over the epidermal loss area. Hopefully this will heal within a few weeks without requiring debridement. The patient will follow-up with me in 2 weeks' time. The patient is only to have pain medication with dressing changes and not during the day otherwise.  DC Dilaudid DC Ultram if patient continues to be somnolent well either decrease or discontinue her gabapentin.   Fabienne Brunsharles Fields, MD [facility] [place] Office: [phone] Pager: [phone]

## 2014-08-10 ENCOUNTER — Ambulatory Visit (INDEPENDENT_AMBULATORY_CARE_PROVIDER_SITE_OTHER): Payer: Self-pay | Admitting: Family

## 2014-08-10 ENCOUNTER — Other Ambulatory Visit: Payer: Self-pay

## 2014-08-10 ENCOUNTER — Inpatient Hospital Stay (HOSPITAL_COMMUNITY): Payer: Medicare Other

## 2014-08-10 ENCOUNTER — Encounter: Payer: Self-pay | Admitting: Family

## 2014-08-10 ENCOUNTER — Inpatient Hospital Stay (HOSPITAL_COMMUNITY)
Admission: AD | Admit: 2014-08-10 | Discharge: 2014-08-19 | DRG: 474 | Disposition: A | Discharge status: Hospice | Payer: Medicare Other | Source: Ambulatory Visit | Attending: Vascular Surgery | Admitting: Vascular Surgery

## 2014-08-10 ENCOUNTER — Telehealth: Payer: Self-pay

## 2014-08-10 ENCOUNTER — Encounter (HOSPITAL_COMMUNITY): Payer: Self-pay | Admitting: General Practice

## 2014-08-10 VITALS — BP 125/62 | HR 81 | Temp 98.0°F | Resp 16 | Ht 64.0 in | Wt 119.0 lb

## 2014-08-10 DIAGNOSIS — J449 Chronic obstructive pulmonary disease, unspecified: Secondary | ICD-10-CM | POA: Diagnosis present

## 2014-08-10 DIAGNOSIS — G9340 Encephalopathy, unspecified: Secondary | ICD-10-CM

## 2014-08-10 DIAGNOSIS — R739 Hyperglycemia, unspecified: Secondary | ICD-10-CM | POA: Insufficient documentation

## 2014-08-10 DIAGNOSIS — Z89612 Acquired absence of left leg above knee: Secondary | ICD-10-CM

## 2014-08-10 DIAGNOSIS — Z992 Dependence on renal dialysis: Secondary | ICD-10-CM | POA: Diagnosis not present

## 2014-08-10 DIAGNOSIS — G9341 Metabolic encephalopathy: Secondary | ICD-10-CM | POA: Diagnosis not present

## 2014-08-10 DIAGNOSIS — E1165 Type 2 diabetes mellitus with hyperglycemia: Secondary | ICD-10-CM | POA: Diagnosis present

## 2014-08-10 DIAGNOSIS — T8781 Dehiscence of amputation stump: Secondary | ICD-10-CM | POA: Diagnosis present

## 2014-08-10 DIAGNOSIS — N2581 Secondary hyperparathyroidism of renal origin: Secondary | ICD-10-CM | POA: Diagnosis present

## 2014-08-10 DIAGNOSIS — Z7189 Other specified counseling: Secondary | ICD-10-CM

## 2014-08-10 DIAGNOSIS — T8789 Other complications of amputation stump: Secondary | ICD-10-CM | POA: Diagnosis present

## 2014-08-10 DIAGNOSIS — R5383 Other fatigue: Secondary | ICD-10-CM | POA: Diagnosis present

## 2014-08-10 DIAGNOSIS — I739 Peripheral vascular disease, unspecified: Secondary | ICD-10-CM | POA: Diagnosis present

## 2014-08-10 DIAGNOSIS — Z66 Do not resuscitate: Secondary | ICD-10-CM | POA: Diagnosis present

## 2014-08-10 DIAGNOSIS — I5022 Chronic systolic (congestive) heart failure: Secondary | ICD-10-CM | POA: Diagnosis present

## 2014-08-10 DIAGNOSIS — I2510 Atherosclerotic heart disease of native coronary artery without angina pectoris: Secondary | ICD-10-CM | POA: Diagnosis present

## 2014-08-10 DIAGNOSIS — G8918 Other acute postprocedural pain: Secondary | ICD-10-CM | POA: Insufficient documentation

## 2014-08-10 DIAGNOSIS — Z515 Encounter for palliative care: Secondary | ICD-10-CM | POA: Insufficient documentation

## 2014-08-10 DIAGNOSIS — E876 Hypokalemia: Secondary | ICD-10-CM | POA: Diagnosis not present

## 2014-08-10 DIAGNOSIS — Z01811 Encounter for preprocedural respiratory examination: Secondary | ICD-10-CM

## 2014-08-10 DIAGNOSIS — Z794 Long term (current) use of insulin: Secondary | ICD-10-CM

## 2014-08-10 DIAGNOSIS — Y835 Amputation of limb(s) as the cause of abnormal reaction of the patient, or of later complication, without mention of misadventure at the time of the procedure: Secondary | ICD-10-CM | POA: Diagnosis present

## 2014-08-10 DIAGNOSIS — N186 End stage renal disease: Secondary | ICD-10-CM | POA: Diagnosis present

## 2014-08-10 DIAGNOSIS — L89159 Pressure ulcer of sacral region, unspecified stage: Secondary | ICD-10-CM | POA: Diagnosis present

## 2014-08-10 DIAGNOSIS — R740 Nonspecific elevation of levels of transaminase and lactic acid dehydrogenase [LDH]: Secondary | ICD-10-CM | POA: Diagnosis not present

## 2014-08-10 DIAGNOSIS — A419 Sepsis, unspecified organism: Secondary | ICD-10-CM | POA: Diagnosis not present

## 2014-08-10 DIAGNOSIS — R627 Adult failure to thrive: Secondary | ICD-10-CM | POA: Diagnosis present

## 2014-08-10 DIAGNOSIS — R410 Disorientation, unspecified: Secondary | ICD-10-CM

## 2014-08-10 DIAGNOSIS — Z9289 Personal history of other medical treatment: Secondary | ICD-10-CM

## 2014-08-10 DIAGNOSIS — D62 Acute posthemorrhagic anemia: Secondary | ICD-10-CM | POA: Diagnosis not present

## 2014-08-10 DIAGNOSIS — E11649 Type 2 diabetes mellitus with hypoglycemia without coma: Secondary | ICD-10-CM | POA: Diagnosis present

## 2014-08-10 DIAGNOSIS — Z4659 Encounter for fitting and adjustment of other gastrointestinal appliance and device: Secondary | ICD-10-CM

## 2014-08-10 DIAGNOSIS — Z6823 Body mass index (BMI) 23.0-23.9, adult: Secondary | ICD-10-CM

## 2014-08-10 DIAGNOSIS — I120 Hypertensive chronic kidney disease with stage 5 chronic kidney disease or end stage renal disease: Secondary | ICD-10-CM | POA: Diagnosis present

## 2014-08-10 DIAGNOSIS — Z452 Encounter for adjustment and management of vascular access device: Secondary | ICD-10-CM

## 2014-08-10 DIAGNOSIS — D573 Sickle-cell trait: Secondary | ICD-10-CM | POA: Diagnosis present

## 2014-08-10 DIAGNOSIS — J9620 Acute and chronic respiratory failure, unspecified whether with hypoxia or hypercapnia: Secondary | ICD-10-CM | POA: Diagnosis not present

## 2014-08-10 DIAGNOSIS — E43 Unspecified severe protein-calorie malnutrition: Secondary | ICD-10-CM | POA: Diagnosis present

## 2014-08-10 DIAGNOSIS — Z89611 Acquired absence of right leg above knee: Secondary | ICD-10-CM | POA: Diagnosis not present

## 2014-08-10 DIAGNOSIS — R4182 Altered mental status, unspecified: Secondary | ICD-10-CM

## 2014-08-10 DIAGNOSIS — T8189XD Other complications of procedures, not elsewhere classified, subsequent encounter: Secondary | ICD-10-CM

## 2014-08-10 LAB — COMPREHENSIVE METABOLIC PANEL
ALK PHOS: 95 U/L (ref 39–117)
ALT: 15 U/L (ref 0–35)
AST: 13 U/L (ref 0–37)
Albumin: 1.7 g/dL — ABNORMAL LOW (ref 3.5–5.2)
Anion gap: 17 — ABNORMAL HIGH (ref 5–15)
BUN: 28 mg/dL — ABNORMAL HIGH (ref 6–23)
CO2: 27 mEq/L (ref 19–32)
Calcium: 9.1 mg/dL (ref 8.4–10.5)
Chloride: 85 mEq/L — ABNORMAL LOW (ref 96–112)
Creatinine, Ser: 3.47 mg/dL — ABNORMAL HIGH (ref 0.50–1.10)
GFR calc Af Amer: 16 mL/min — ABNORMAL LOW (ref >90)
GFR calc non Af Amer: 14 mL/min — ABNORMAL LOW (ref >90)
Glucose, Bld: 422 mg/dL — ABNORMAL HIGH (ref 70–99)
POTASSIUM: 4 meq/L (ref 3.7–5.3)
Sodium: 129 mEq/L — ABNORMAL LOW (ref 137–147)
Total Bilirubin: 0.3 mg/dL (ref 0.3–1.2)
Total Protein: 6.7 g/dL (ref 6.0–8.3)

## 2014-08-10 LAB — PROTIME-INR
INR: 1.43 (ref 0.00–1.49)
PROTHROMBIN TIME: 17.5 s — AB (ref 11.6–15.2)

## 2014-08-10 LAB — CBC
HEMATOCRIT: 25.6 % — AB (ref 36.0–46.0)
Hemoglobin: 8.2 g/dL — ABNORMAL LOW (ref 12.0–15.0)
MCH: 26.8 pg (ref 26.0–34.0)
MCHC: 32 g/dL (ref 30.0–36.0)
MCV: 83.7 fL (ref 78.0–100.0)
Platelets: 235 10*3/uL (ref 150–400)
RBC: 3.06 MIL/uL — ABNORMAL LOW (ref 3.87–5.11)
RDW: 16.4 % — AB (ref 11.5–15.5)
WBC: 13.4 10*3/uL — ABNORMAL HIGH (ref 4.0–10.5)

## 2014-08-10 LAB — GLUCOSE, CAPILLARY
GLUCOSE-CAPILLARY: 384 mg/dL — AB (ref 70–99)
Glucose-Capillary: 407 mg/dL — ABNORMAL HIGH (ref 70–99)

## 2014-08-10 MED ORDER — PHENOL 1.4 % MT LIQD: 1.0000 | OROMUCOSAL | Status: DC | PRN

## 2014-08-10 MED ORDER — DARBEPOETIN ALFA 100 MCG/0.5ML IJ SOSY
100.0000 ug | PREFILLED_SYRINGE | INTRAMUSCULAR | Status: DC
  Administered 2014-08-11: 100 ug via INTRAVENOUS
  Filled 2014-08-10: qty 0.5

## 2014-08-10 MED ORDER — BISACODYL 10 MG RE SUPP: 10.0000 mg | Freq: Every day | RECTAL | Status: DC | PRN

## 2014-08-10 MED ORDER — ACETAMINOPHEN 650 MG RE SUPP
325.0000 mg | RECTAL | Status: DC | PRN
  Administered 2014-08-13 – 2014-08-17 (×2): 650 mg via RECTAL
  Filled 2014-08-10: qty 1

## 2014-08-10 MED ORDER — HYDROMORPHONE HCL 1 MG/ML IJ SOLN: 0.3000 mg | INTRAMUSCULAR | Status: DC | PRN

## 2014-08-10 MED ORDER — POTASSIUM CHLORIDE CRYS ER 20 MEQ PO TBCR
20.0000 meq | EXTENDED_RELEASE_TABLET | Freq: Once | ORAL | Status: AC
  Administered 2014-08-10: 40 meq via ORAL
  Filled 2014-08-10: qty 2

## 2014-08-10 MED ORDER — DOXERCALCIFEROL 4 MCG/2ML IV SOLN
3.0000 ug | INTRAVENOUS | Status: DC
  Administered 2014-08-11 – 2014-08-15 (×3): 3 ug via INTRAVENOUS
  Filled 2014-08-10 (×3): qty 2

## 2014-08-10 MED ORDER — VANCOMYCIN HCL IN DEXTROSE 1-5 GM/200ML-% IV SOLN
1000.0000 mg | Freq: Once | INTRAVENOUS | Status: DC
  Filled 2014-08-10 (×2): qty 200

## 2014-08-10 MED ORDER — VANCOMYCIN HCL 500 MG IV SOLR
500.0000 mg | INTRAVENOUS | Status: DC
  Administered 2014-08-11 – 2014-08-15 (×3): 500 mg via INTRAVENOUS
  Filled 2014-08-10 (×4): qty 500

## 2014-08-10 MED ORDER — ENOXAPARIN SODIUM 30 MG/0.3ML ~~LOC~~ SOLN
30.0000 mg | SUBCUTANEOUS | Status: DC
  Administered 2014-08-10 – 2014-08-18 (×7): 30 mg via SUBCUTANEOUS
  Filled 2014-08-10 (×10): qty 0.3

## 2014-08-10 MED ORDER — METOPROLOL TARTRATE 1 MG/ML IV SOLN
2.0000 mg | INTRAVENOUS | Status: DC | PRN
Start: 2014-08-10 — End: 2014-08-13

## 2014-08-10 MED ORDER — OXYCODONE-ACETAMINOPHEN 5-325 MG PO TABS: 1.0000 | ORAL_TABLET | ORAL | Status: DC | PRN

## 2014-08-10 MED ORDER — GUAIFENESIN-DM 100-10 MG/5ML PO SYRP: 15.0000 mL | ORAL_SOLUTION | ORAL | Status: DC | PRN

## 2014-08-10 MED ORDER — ONDANSETRON HCL 4 MG/2ML IJ SOLN: 4.0000 mg | Freq: Four times a day (QID) | INTRAMUSCULAR | Status: DC | PRN

## 2014-08-10 MED ORDER — DOCUSATE SODIUM 100 MG PO CAPS
100.0000 mg | ORAL_CAPSULE | Freq: Two times a day (BID) | ORAL | Status: DC
  Administered 2014-08-12 – 2014-08-15 (×3): 100 mg via ORAL
  Filled 2014-08-10 (×16): qty 1

## 2014-08-10 MED ORDER — SODIUM CHLORIDE 0.9 % IV SOLN
INTRAVENOUS | Status: DC
  Administered 2014-08-12 (×2): via INTRAVENOUS

## 2014-08-10 MED ORDER — PIPERACILLIN-TAZOBACTAM IN DEX 2-0.25 GM/50ML IV SOLN
2.2500 g | Freq: Three times a day (TID) | INTRAVENOUS | Status: DC
  Administered 2014-08-13 – 2014-08-19 (×18): 2.25 g via INTRAVENOUS
  Filled 2014-08-10 (×28): qty 50

## 2014-08-10 MED ORDER — ACETAMINOPHEN 325 MG PO TABS
325.0000 mg | ORAL_TABLET | ORAL | Status: DC | PRN
  Administered 2014-08-14 – 2014-08-15 (×2): 650 mg via ORAL
  Administered 2014-08-16 (×2): 325 mg via ORAL
  Filled 2014-08-10 (×2): qty 1
  Filled 2014-08-10: qty 2
  Filled 2014-08-10: qty 1

## 2014-08-10 MED ORDER — LABETALOL HCL 5 MG/ML IV SOLN
10.0000 mg | INTRAVENOUS | Status: DC | PRN
  Administered 2014-08-14 – 2014-08-16 (×2): 10 mg via INTRAVENOUS
  Filled 2014-08-10 (×4): qty 4

## 2014-08-10 MED ORDER — HYDROCODONE-ACETAMINOPHEN 5-325 MG PO TABS
1.0000 | ORAL_TABLET | ORAL | Status: DC | PRN
  Administered 2014-08-10: 1 via ORAL
  Administered 2014-08-11 (×4): 2 via ORAL
  Administered 2014-08-12: 1 via ORAL
  Filled 2014-08-10: qty 1
  Filled 2014-08-10: qty 2
  Filled 2014-08-10: qty 1
  Filled 2014-08-10 (×3): qty 2

## 2014-08-10 MED ORDER — HYDRALAZINE HCL 20 MG/ML IJ SOLN
5.0000 mg | INTRAMUSCULAR | Status: DC | PRN
  Filled 2014-08-10: qty 1

## 2014-08-10 MED ORDER — PIPERACILLIN-TAZOBACTAM 3.375 G IVPB
3.3750 g | Freq: Three times a day (TID) | INTRAVENOUS | Status: DC
  Filled 2014-08-10 (×2): qty 50

## 2014-08-10 MED ORDER — ALUM & MAG HYDROXIDE-SIMETH 200-200-20 MG/5ML PO SUSP: 15.0000 mL | ORAL | Status: DC | PRN

## 2014-08-10 MED ORDER — PANTOPRAZOLE SODIUM 40 MG PO TBEC
40.0000 mg | DELAYED_RELEASE_TABLET | Freq: Every day | ORAL | Status: DC
  Administered 2014-08-10 – 2014-08-12 (×3): 40 mg via ORAL
  Filled 2014-08-10 (×3): qty 1

## 2014-08-10 NOTE — Consult Note (Signed)
[facility] Renal Consultation Note  Indication for Consultation:  Management of ESRD/hemodialysis; anemia, hypertension/volume and secondary hyperparathyroidism  HPI: [name] is a 53 y.o. 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  The surgery was performed by Dr. Darrick PennaFields of [facility] on 11/2, but over the last two weeks the patient has had worsening pain.  She was treated in the [facility] office on 12/3 for a superficial epidermal loss at the inner left thigh and buttock, but during follow-up today had opening of the surgical incision with sanguinous discharge which required hospital admission with wound debridement per Dr. Darrick PennaFields scheduled on 12/9.  Her dialysis is now scheduled four days a week, but recently she signs off early, secondary to pain, and averages about 2:15 per treatment.   Dialysis Orders:  MTTS @ [facility] 4 hrs       51.5 kg      2K/2Ca       400/A1.5       Heparin 3000 U        AVG @ LFA No Hectorol             No Aranesp or Venofer  Past Medical History  Diagnosis Date  . Hypertension   . Anemia   . Systolic CHF, acute on chronic   . Coronary artery disease   . Peripheral vascular disease   . CHF (congestive heart failure)   . Angina   . Blood transfusion ~2003; 08/12/2012    "when I started on dialysis; lower gi bleeding" (08/12/2012)  . Hypercholesteremia   . DVT of leg (deep venous thrombosis) 2011    "RLE" (08/12/2012)  . Type II diabetes mellitus   . Acute lower GI bleeding 08/12/2012  . Heart murmur     some say yes , some say no  . Shortness of breath 01/02/12    "@rest ; lying down; w/exertion"; no change 08/12/2012  . COPD (chronic obstructive pulmonary  disease)   . Pneumonia     Dec 2013  . Sickle cell trait   . ESRD on dialysis     [facility]   Past Surgical History  Procedure Laterality Date  . 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 Av fistula placement  ~ 2003    left upper arm  . Abdominal hysterectomy  1990's  . Arteriovenous graft placement  2011    left upper arm  . Vascular surgery    . Cardiac catheterization  ~ 2010  . Cesarean section  1988  . Esophagogastroduodenoscopy  08/12/2012    Procedure: ESOPHAGOGASTRODUODENOSCOPY (EGD);  Gravatt: Vertell NovakJames L Edwards Jr., MD;  [facility];  Service: Endoscopy;  Laterality: N/A;  . Colonoscopy  08/13/2012    Procedure: COLONOSCOPY;  Fickle: Vertell NovakJames L Edwards Jr., MD;  [facility];  Service: Endoscopy;  Laterality: N/A;  . Resection of arteriovenous fistula aneurysm Left 03/19/2013    Procedure: EXCISION OF LEFT BRACHIOCEPHALIC ARTERIOVENOUS FISTULA PSEUDOANEURYSM;  Masella: Fransisco HertzBrian L Chen, MD;  [facility];  Service: Vascular;  Laterality: Left;  . Insertion of dialysis  catheter N/A 03/19/2013    Procedure: INSERTION OF DIALYSIS CATHETER;  Sherwood: Fransisco HertzBrian L Chen, MD;  [facility];  Service: Vascular;  Laterality: N/A;  Right Internal Jugular Placement  . Revision of arteriovenous goretex graft Left 03/19/2013    Procedure: REVISION OF ARTERIOVENOUS GORETEX GRAFT;  Wronski: Fransisco HertzBrian L Chen, MD;  [facility];  Service: Vascular;  Laterality: Left;  using 6mm x 20cm Gore-Tex Graft  . Amputation Left 07/06/2014    Procedure: Left AMPUTATION ABOVE KNEE;  [place]: Sherren Kernsharles E Fields, MD;  [facility];  Service: Vascular;  Laterality: Left;   Family History  Problem Relation Age of Onset  . Breast cancer Sister   . Breast cancer Sister   . Kidney failure Mother   . Diabetes Mother   . Cancer - Other Father    Social History She has been on dialysis for 16 years and  denies any history of tobacco, alcohol, or illicit drug use.  Allergies  Allergen Reactions  . Oxycodone Nausea And Vomiting   Prior to Admission medications   Medication Sig Start Date End Date Taking? Authorizing Provider  BENICAR 40 MG tablet Take 40 mg by mouth daily.  01/06/13   Historical Provider, MD  Bismuth Tribromoph-Petrolatum (XEROFORM PETROLAT GAUZE 5"X9") MISC Apply 1 application topically every morning. 08/06/14   Sherren Kernsharles E Fields, MD  carvedilol (COREG) 25 MG tablet Take 25 mg by mouth 2 (two) times daily with a meal.    Historical Provider, MD  cephALEXin (KEFLEX) 500 MG capsule Take 1 capsule (500 mg total) by mouth daily. 07/25/14   Juliet RudeNathan R. Pickering, MD  gabapentin (NEURONTIN) 300 MG capsule Take 1 capsule (300 mg total) by mouth 2 (two) times daily. 07/29/14   Carma LairSuzanne L Nickel, NP  insulin glargine (LANTUS) 100 UNIT/ML injection Inject 16 Units into the skin at bedtime. 08/14/12   Penny Piarlando Vega, MD  sevelamer carbonate (RENVELA) 800 MG tablet Take 800-1,600 mg by mouth See admin instructions. Take 1600mg  with meals and 800mg  with snacks    Historical Provider, MD   Labs: No results found for this or any previous visit (from the past 48 hour(s)).  Constitutional: negative for chills, fatigue, fevers and sweats Ears, nose, mouth, throat, and face: negative for earaches, hoarseness, nasal congestion and sore throat Respiratory: negative for cough, dyspnea on exertion, hemoptysis and sputum Cardiovascular: negative for chest pain, chest pressure/discomfort, dyspnea, orthopnea and palpitations Gastrointestinal: negative for abdominal pain, change in bowel habits, nausea and vomiting Genitourinary:negative, oliguric Musculoskeletal: positive for L lower extremity pain; negative for arthralgias, back pain, myalgias and neck pain Neurological: negative for dizziness, headaches, paresthesia, speech problems and weakness  Physical Exam: There were no vitals filed for this visit.    General appearance: alert, cooperative and no distress Head: Normocephalic, without obvious abnormality, atraumatic Neck: no adenopathy, no carotid bruit, no JVD and supple, symmetrical, trachea midline Resp: clear to auscultation bilaterally Cardio: regular rate and rhythm, S1, S2 normal, no murmur, click, rub or gallop GI: soft, non-tender; bowel sounds normal; no masses,  no organomegaly Extremities: R well-healed BKA , L AKA and L buttock with dressing Neurologic: Grossly normal Dialysis Access:  AVG @ LFA with + bruit   Assessment/Plan: 1. Non-healing L AKA - significant pain, started outpatient Vancomycin & Elita QuickFortaz 11/28 per HD, now Vancomycin & Zosyn; L AKA 11/2 per Dr.Fields, debridement scheduled 12/9. 2. ESRD - [place] [facility] [place] [facility], but recently signing off early (2:15 today at outpatient center).  HD  tomorrow. 3. Hypertension/volume - BP 125/62, outpatient Benicar 40 mg qd, Carvedilol 25 mg bid; wt 54 kg, not reaching EDW sec to early sign offs.  4. Anemia - Hgb 10.4, T-sat 32%, on Aranesp 100 mcg on Tues. 5. Metabolic bone disease - Last Corrected Ca 10.3, P 6.6, iPTH 1061; Hectorol 3 mcg, Renvela 2 with meals. 6. Nutrition - Last Alb 3.6, carb-mod renal diet. 7. DM Type 2 - per primary. 8. 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  LYLES,CHARLES 08/10/2014, 3:28 PM   Attending Nephrologist: Casimiro Needle, MD  Agree with note as articulated by Mr Randon Goldsmith.  She will cont on 4 days per week HD and has no interest in switching or trying three days again.  Plan for HD in AM again. Surgery per Dr. Darrick Penna. Lindell Tussey C

## 2014-08-10 NOTE — Progress Notes (Signed)
Patient CBG 384. No Sliding scale order. MD paged @ 2013. Received no call back. Will continue to monitor.   Valinda HoarLexie Thekla Colborn RN

## 2014-08-10 NOTE — Progress Notes (Signed)
Postoperative Visit   History of Present Illness  [name] is a 53 y.o. female patient of Dr. Imogene Burnhen and Dr. Darrick PennaFields. On 03/19/13 she had the following performed by Dr Imogene Burnhen:  1. Right internal jugular vein tunneled dialysis catheter placement  2. Right internal jugular vein cannulation under ultrasound guidance  3. Excision of ruptured graft segment  4. Revision of left hybrid brachiocephalic arteriovenous fistula and upper arm graft with interposition graft  5. 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 She denies any problems with her left upper arm AVG, HD on M-T-Th-Sat. Husband accompanying pt, states daughter has been changing Xeroform gauze daily to buttocks and posterior left thigh and left stump incision. Patient and husband deny that patient used a heating pad to her thigh or buttocks, deny that she sustained a burn.  Patient denies fever or chills.   Dr. Darrick PennaFields performed a right AKA in 05/2010, a left BKA in 06/2010, and a left AKA 07/06/14 for severe ischemia in both legs.  Patient returns today with c/o the incision of left AKA has opened up approx. 1/2 " and she can see a "hole in the incision." Stated the opening is a separate area from the inner thigh area that was treated last week.  Her A1C October, 2015 was 8.1, uncontrolled DM. She is a non-smoker.   Physical Examination  Filed Vitals:   08/10/14 1356  BP: 125/62  Pulse: 81  Temp: 98 F (36.7 C)  TempSrc: Oral  Resp: 16  Height: 5\' 4"  (1.626 m)  Weight: 119 lb 0.8 oz (54 kg)  SpO2: 96%   Body mass index is 20.42 kg/(m^2).   General: The patient appears their stated age, arrived in her wheelchair, is lying in a fetal position on stretcher, moaning.  HEENT: No gross abnormalities Pulmonary: Respirations are non-labored Abdomen: Soft and non-tender. Musculoskeletal: Bilateral AKA's. 2 small areas of loss of  epidermis at buttocks, each about 2 x 3 cm, Xeroform gauze covering these areas and large area at posterior thigh with loss of epidermis. Left AKA stump incision has separated with about a 1-1.5 inch gap, with fibrinous exudate, large amount of serosanguinous drainage emitting from left AKA stump incision.  Neurologic: No focal weakness or paresthesias are detected, Skin:  No ulcers on right LE stump. See Muscoskeletal.  Psychiatric: The patient is lying on her left side in a fetal position, moaning, indicating severe pain but unable to indicate whether source of pain is her left thigh or buttocks or left AKA stump or elsewhere. Cardiovascular: There is a regular rate and rhythm without significant murmur appreciated. Both femoral pulses are palpable. Left upper arm AV graft has a palpable thrill.   Medical Decision Making  [name] is a 53 y.o. female who presents s/p left above-the-knee amputation on 07/06/14; she previously had a right AKA in 05/2010, a left BKA in 06/2010 for severe ischemia in both legs. Spoke with PA Luisa HartPatrick from Nephrology who states pt is unable to tolerate dialysis for more than 2-3.5 hours, complains of severe pain; she therefore requires 4 days of hemodialysis. The patient's last A1C was 8.1 indicating uncontrolled DM.  The patient's left AKA stump is not healing, incision has separated with about a 1-1.5 inch gap, with fibrinous exudate and large amount of serosanguinous drainage.  Pt complains of severe pain, is unable to state if pain is in left thigh or buttocks or left AKA stump  or elsewhere, loud moaning at times; note that on office visit last month the patient was considerably somnolent. Daughter was giving pt prescribed hydromorphone, 1/2 of a 2 mg tablet when pt was somnolent.  Pt states she has had no medication for pain today. Dr. Darrick PennaFields spoke with patient and husband and examined patient. The patient [facility] for IV antibiotics:  Vancomycin and Zosyn, pharmacy to dose, and will be scheduled for debridement of left AKA stump incision on a non dialysis day, Wednesday 08/12/14 by Dr. Darrick PennaFields.  Left thigh dressed with Xeroform gauze, left AKA stump incision dressed with wet to dry dressing and Kerlex, no tape applied, husband to [facility] after speaking with Lamar Blinksarol Pullins, RN, who discussed admission with patient and her husband.   NICKEL, Carma LairSUZANNE L, RN, MSN, FNP-C [facility] Office: [phone]  08/10/2014, 1:47 PM  Clinic MD: Myra GianottiBrabham

## 2014-08-10 NOTE — Telephone Encounter (Signed)
Phone call from daughter.  Stated the incision of left AKA has opened up approx. 1/2 " and she can see a "hole in the incision."  Stated the opening is a separate area from the inner thigh area that was treated last week.  Asking for appt.  Appt. given for 1:40 PM today, with nurse practitioner.  Daughter agrees with plan.

## 2014-08-10 NOTE — Progress Notes (Signed)
Pt arrived to the room via her personal wheelchair with assistance from husband at 1530 as direct admit. Pt oriented to unit and room; placed on telemetry called in to [facility]; MD in to assess pt; pt changed from personal clothing to hospital gown; pt had dsg to left stump with xeroform dsg to left thigh wounds and buttocks. Pt noted to moan in pain when left thigh is touched or moved. IV team paged to start an IV. Will continue to monitor pt quietly. Paula MerlesP. Amo Delainie Chavana RN.

## 2014-08-10 NOTE — Progress Notes (Signed)
D/t drainage from pt wound; pt placed on contact precautions. Paula BucyP. Amo Korie Brabson RN

## 2014-08-10 NOTE — Patient Instructions (Signed)
Amputation °Many new amputations occur each year. The most common causes of amputation of the lower extremity (the hip down) are: °· Disease. °· Injury caused in an accidents or wars (trauma). °· Birth defects. °· Lumps (tumors) that are cancer. °Upper extremity amputation is usually the result of trauma or birth defect, with disease being a less common cause. °COMMON PROBLEMS °After an amputation a number of issues need to be considered. Getting around and self-care are early problems that must be dealt with. A complete rehabilitation program will help the amputee recover mobility. A team approach of caregivers helps the most. Caregivers that can provide a well rounded program include:  °· Physicians. °· Therapists. °· Nurses. °· Social workers. °· Psychologists. °Usually there are problems with body image and coping with lifestyle changes. A grieving period similar to dealing with a death in the family is common after an amputation. Talking to a trained professional with experience in treating people with similar problems can be very helpful. °When returning to a previous lifestyle, questions about sexuality can arise. Many of these uncertainties are normal. These can be discussed with your psychologist or rehabilitation specialist. °REHABILITATION AND RETURN TO WORK AND ACTIVITIES °Returning to recreational activities and employment are part of recovery. Many times, changes to recreation equipment can allow return to a sport or hobby. A device that substitutes the missing part of the body is called a prosthetic. Many prosthetic manufacturers produce components designed for sports. Be sure to discuss all of your leisure interests with your prosthetist. This is the person who helps provide you with custom made replacement limbs. Your physician will also help to select a prosthetic that will meet your needs. °Employers will vary in their willingness to change a work environment in order to help people with  disabilities. Your therapists can perform job site evaluations. Your therapist can then make recommendations to help with your work area. Some amputees will not be able to return to previous jobs. Your local Office of Vocational Rehabilitation can assist you in job retraining.  °Once you are past the initial rehabilitation stage you will have ongoing contact with caregivers and a prosthetist. You need to work closely with them in making decisions about your prosthetic device. °PROGNOSIS  °Amputation should not end your joy of life. There are people with limb loss in nearly all walks of life. They are in a wide variety of professions. They participate in nearly all sports. Ask your caregivers about support groups and sports organizations in your area. They can help you with referral to organizations that will be helpful to you. °Document Released: 05/13/2002 Document Revised: 11/13/2011 Document Reviewed: 07/08/2007 °ExitCare® Patient Information ©2015 [employer]. This information is not intended to replace advice given to you by your health care provider. Make sure you discuss any questions you have with your health care provider. ° °

## 2014-08-10 NOTE — Progress Notes (Signed)
ANTIBIOTIC CONSULT NOTE - INITIAL  Pharmacy Consult for vancomycin and zosyn Indication: left AKA wound infection  Allergies  Allergen Reactions  . Oxycodone Nausea And Vomiting    Patient Measurements:   Adjusted Body Weight:   Vital Signs: Temp: 98 F (36.7 C) (12/07 1356) Temp Source: Oral (12/07 1356) BP: 125/62 mmHg (12/07 1356) Pulse Rate: 81 (12/07 1356) Intake/Output from previous day:   Intake/Output from this shift:    Labs: No results for input(s): WBC, HGB, PLT, LABCREA, CREATININE in the last 72 hours. Estimated Creatinine Clearance: 15.7 mL/min (by C-G formula based on Cr of 3.45). No results for input(s): VANCOTROUGH, VANCOPEAK, VANCORANDOM, GENTTROUGH, GENTPEAK, GENTRANDOM, TOBRATROUGH, TOBRAPEAK, TOBRARND, AMIKACINPEAK, AMIKACINTROU, AMIKACIN in the last 72 hours.   Microbiology: No results found for this or any previous visit (from the past 720 hour(s)).  Medical History: Past Medical History  Diagnosis Date  . Hypertension   . Anemia   . Systolic CHF, acute on chronic   . Coronary artery disease   . Peripheral vascular disease   . CHF (congestive heart failure)   . Angina   . Blood transfusion ~2003; 08/12/2012    "when I started on dialysis; lower gi bleeding" (08/12/2012)  . Hypercholesteremia   . DVT of leg (deep venous thrombosis) 2011    "RLE" (08/12/2012)  . Type II diabetes mellitus   . Acute lower GI bleeding 08/12/2012  . Heart murmur     some say yes , some say no  . Shortness of breath 01/02/12    "@rest ; lying down; w/exertion"; no change 08/12/2012  . COPD (chronic obstructive pulmonary disease)   . Pneumonia     Dec 2013  . Sickle cell trait   . ESRD on dialysis     [facility]    Medications:  Scheduled:  . docusate sodium  100 mg Oral BID  . enoxaparin (LOVENOX) injection  30 mg Subcutaneous Q24H  . pantoprazole  40 mg Oral Daily  . piperacillin-tazobactam (ZOSYN)  IV  3.375 g  Intravenous 3 times per day  . potassium chloride  20-40 mEq Oral Once   Infusions:  . sodium chloride     Assessment: 53 yo female with left AKA wound infection will be started on vancomycin and zosyn.  Patient has a history of ESRD on dialysis [facility] per MD's note  Goal of Therapy:  pre-HD vancomycin level 15-25 mcg/mL  Plan:  - vancomycin 1000 mg iv x1, then vancomycin 500 mg qHD - change zosyn to 2.25 g iv q8h - follow up HD schedule   Astoria Condon, Tsz-Yin 08/10/2014,4:01 PM

## 2014-08-10 NOTE — Progress Notes (Addendum)
Pt cbg 407 but no SSI order, PA and MD paged; awaiting on return call back for new orders to cover pt. Pt dsg to left stump and sacrum changed with charge nurse Dois DavenportSandra. Pt turned to her right side in bed comfortably with call light within reach. Pt vitals taken, EKG completed and in pt chart. Will report off to incoming RN. Arabella MerlesP. Amo Diesha Rostad RN.

## 2014-08-10 NOTE — Progress Notes (Signed)
IV team inform RN they couldn't insert IV and pt got central line on her previous admission so MD should be contacted; Dr. Darrick PennaFields called and notified of pt arrival to the unit as well as IV team unable to insert an IV and pt needs central line; MD said pt will have to go without her scheduled antibiotics tonight and he will order for a central line in the morning. Arabella MerlesP. Amo Ashton Belote RN.

## 2014-08-11 LAB — RENAL FUNCTION PANEL
ALBUMIN: 1.7 g/dL — AB (ref 3.5–5.2)
ALBUMIN: 1.7 g/dL — AB (ref 3.5–5.2)
ANION GAP: 13 (ref 5–15)
ANION GAP: 15 (ref 5–15)
BUN: 28 mg/dL — ABNORMAL HIGH (ref 6–23)
BUN: 34 mg/dL — AB (ref 6–23)
CO2: 28 mEq/L (ref 19–32)
CO2: 29 mEq/L (ref 19–32)
CREATININE: 4.51 mg/dL — AB (ref 0.50–1.10)
Calcium: 9.5 mg/dL (ref 8.4–10.5)
Calcium: 9.6 mg/dL (ref 8.4–10.5)
Chloride: 89 mEq/L — ABNORMAL LOW (ref 96–112)
Chloride: 90 mEq/L — ABNORMAL LOW (ref 96–112)
Creatinine, Ser: 3.81 mg/dL — ABNORMAL HIGH (ref 0.50–1.10)
GFR calc Af Amer: 14 mL/min — ABNORMAL LOW (ref >90)
GFR calc Af Amer: 12 mL/min — ABNORMAL LOW (ref >90)
GFR calc non Af Amer: 10 mL/min — ABNORMAL LOW (ref >90)
GFR, EST NON AFRICAN AMERICAN: 12 mL/min — AB (ref >90)
GLUCOSE: 330 mg/dL — AB (ref 70–99)
Glucose, Bld: 269 mg/dL — ABNORMAL HIGH (ref 70–99)
PHOSPHORUS: 6.5 mg/dL — AB (ref 2.3–4.6)
PHOSPHORUS: 5.7 mg/dL — AB (ref 2.3–4.6)
POTASSIUM: 4.9 meq/L (ref 3.7–5.3)
POTASSIUM: 4.3 meq/L (ref 3.7–5.3)
Sodium: 132 mEq/L — ABNORMAL LOW (ref 137–147)
Sodium: 132 mEq/L — ABNORMAL LOW (ref 137–147)

## 2014-08-11 LAB — GLUCOSE, CAPILLARY
Glucose-Capillary: 285 mg/dL — ABNORMAL HIGH (ref 70–99)
Glucose-Capillary: 252 mg/dL — ABNORMAL HIGH (ref 70–99)
Glucose-Capillary: 148 mg/dL — ABNORMAL HIGH (ref 70–99)

## 2014-08-11 LAB — CBC
HCT: 24.3 % — ABNORMAL LOW (ref 36.0–46.0)
HEMATOCRIT: 23.5 % — AB (ref 36.0–46.0)
Hemoglobin: 7.4 g/dL — ABNORMAL LOW (ref 12.0–15.0)
Hemoglobin: 7.8 g/dL — ABNORMAL LOW (ref 12.0–15.0)
MCH: 26.7 pg (ref 26.0–34.0)
MCH: 27.7 pg (ref 26.0–34.0)
MCHC: 31.5 g/dL (ref 30.0–36.0)
MCHC: 32.1 g/dL (ref 30.0–36.0)
MCV: 84.8 fL (ref 78.0–100.0)
MCV: 86.2 fL (ref 78.0–100.0)
PLATELETS: 212 10*3/uL (ref 150–400)
PLATELETS: 216 10*3/uL (ref 150–400)
RBC: 2.77 MIL/uL — ABNORMAL LOW (ref 3.87–5.11)
RBC: 2.82 MIL/uL — ABNORMAL LOW (ref 3.87–5.11)
RDW: 16.5 % — AB (ref 11.5–15.5)
RDW: 16.6 % — AB (ref 11.5–15.5)
WBC: 11.7 10*3/uL — ABNORMAL HIGH (ref 4.0–10.5)
WBC: 12.3 10*3/uL — ABNORMAL HIGH (ref 4.0–10.5)

## 2014-08-11 LAB — HEMOGLOBIN A1C
HEMOGLOBIN A1C: 10.6 % — AB (ref <5.7)
MEAN PLASMA GLUCOSE: 258 mg/dL — AB (ref <117)

## 2014-08-11 MED ORDER — LORAZEPAM 2 MG/ML IJ SOLN
1.0000 mg | Freq: Once | INTRAMUSCULAR | Status: AC
  Administered 2014-08-11: 1 mg via INTRAVENOUS

## 2014-08-11 MED ORDER — LIDOCAINE HCL (PF) 1 % IJ SOLN: 5.0000 mL | INTRAMUSCULAR | Status: DC | PRN

## 2014-08-11 MED ORDER — DARBEPOETIN ALFA 100 MCG/0.5ML IJ SOSY
PREFILLED_SYRINGE | INTRAMUSCULAR | Status: AC
  Filled 2014-08-11: qty 0.5

## 2014-08-11 MED ORDER — LORAZEPAM 2 MG/ML IJ SOLN
INTRAMUSCULAR | Status: AC
  Administered 2014-08-11: 1 mg via INTRAVENOUS
  Filled 2014-08-11: qty 1

## 2014-08-11 MED ORDER — SODIUM CHLORIDE 0.9 % IV SOLN: 100.0000 mL | INTRAVENOUS | Status: DC | PRN

## 2014-08-11 MED ORDER — PENTAFLUOROPROP-TETRAFLUOROETH EX AERO: 1.0000 "application " | INHALATION_SPRAY | CUTANEOUS | Status: DC | PRN

## 2014-08-11 MED ORDER — DOXERCALCIFEROL 4 MCG/2ML IV SOLN
INTRAVENOUS | Status: AC
  Administered 2014-08-11: 3 ug via INTRAVENOUS
  Filled 2014-08-11: qty 4

## 2014-08-11 MED ORDER — INSULIN ASPART 100 UNIT/ML ~~LOC~~ SOLN
0.0000 [IU] | Freq: Three times a day (TID) | SUBCUTANEOUS | Status: DC
  Administered 2014-08-14 (×2): 2 [IU] via SUBCUTANEOUS
  Administered 2014-08-15: 5 [IU] via SUBCUTANEOUS
  Administered 2014-08-16 (×2): 3 [IU] via SUBCUTANEOUS
  Administered 2014-08-16: 11 [IU] via SUBCUTANEOUS
  Administered 2014-08-17 (×2): 5 [IU] via SUBCUTANEOUS
  Administered 2014-08-17 – 2014-08-18 (×2): 15 [IU] via SUBCUTANEOUS

## 2014-08-11 MED ORDER — HEPARIN SODIUM (PORCINE) 1000 UNIT/ML DIALYSIS: 1000.0000 [IU] | INTRAMUSCULAR | Status: DC | PRN

## 2014-08-11 MED ORDER — LIDOCAINE-PRILOCAINE 2.5-2.5 % EX CREA: 1.0000 "application " | TOPICAL_CREAM | CUTANEOUS | Status: DC | PRN

## 2014-08-11 MED ORDER — ALTEPLASE 2 MG IJ SOLR: 2.0000 mg | Freq: Once | INTRAMUSCULAR | Status: DC | PRN

## 2014-08-11 MED ORDER — NEPRO/CARBSTEADY PO LIQD: 237.0000 mL | ORAL | Status: DC | PRN

## 2014-08-11 NOTE — Progress Notes (Signed)
Pt transported off unit to dialysis. P. Amo Tyia Binford RN 

## 2014-08-11 NOTE — Progress Notes (Signed)
Inpatient Diabetes Program Recommendations  AACE/ADA: New Consensus Statement on Inpatient Glycemic Control (2013)  Target Ranges:  Prepandial:   less than 140 mg/dL      Peak postprandial:   less than 180 mg/dL (1-2 hours)      Critically ill patients:  140 - 180 mg/dL  Inpatient Diabetes Program Recommendations Insulin - Basal: Pt takes lantus 16 units at home. May want to order lantus at 10-[facility]. Correction (SSI): May want to decrease correction to sensitive tidwc and use the basal lantus Sensitive Scale is on the glycemic control order set.  Thank you, Lenor CoffinAnn Jaelen Gellerman, RN, CNS, Diabetes Coordinator [phone])

## 2014-08-11 NOTE — Procedures (Signed)
Tolerating hemodialysis treatment without any instability. Goal 3500 cc.   [name]

## 2014-08-11 NOTE — Progress Notes (Signed)
Pt arrived back to room from dialysis; pt family at bedside; pt appears drowsy but open eyes to voices to communicate. Pt report pain and starts crying; pt and family request for something for pain. Pt medicated d/t dsg to left stump about to be changed. Arabella MerlesP. Amo Antwian Santaana RN.

## 2014-08-11 NOTE — Progress Notes (Signed)
[facility]  Subjective  - Pt lethargic but rousable oriented only to name   Objective 136/53 73 98.1 F (36.7 C) (Oral) 17 100% No intake or output data in the 24 hours ending 08/11/14 0755  Left AKA with epidermal loss and poor healing stump unchanged  Assessment/Planning: Non healing left AKA debridement tomorrow Central access from critical care service today for IV antibiotics Continue local wound care Minimize sedating meds Hyperglycemia sliding scale  Gentry Pilson E 08/11/2014 7:55 AM --  Laboratory Lab Results:  Recent Labs  08/10/14 1913 08/11/14 0048  WBC 13.4* 12.3*  HGB 8.2* 7.8*  HCT 25.6* 24.3*  PLT 235 216   BMET  Recent Labs  08/10/14 1913 08/11/14 0048  NA 129* 132*  K 4.0 4.3  CL 85* 89*  CO2 27 28  GLUCOSE 422* 330*  BUN 28* 28*  CREATININE 3.47* 3.81*  CALCIUM 9.1 9.5    COAG Lab Results  Component Value Date   INR 1.43 08/10/2014   INR 1.15 05/24/2010   No results found for: PTT      

## 2014-08-11 NOTE — Progress Notes (Signed)
Assessment/Plan: 1. Non-healing L AKA - significant pain, started outpatient Vancomycin & Elita QuickFortaz 11/28 per HD, now Vancomycin & Zosyn; L AKA 11/2 per Dr.Fields, debridement scheduled 12/9. 2. ESRD - HD on MTTS @ [facility], but recently signing off early (2:15 today at outpatient center). HD today 3. Hypertension/volume - BP 125/62, outpatient Benicar 40 mg qd, Carvedilol 25 mg bid; wt 54 kg, not reaching EDW sec to early sign offs.  4. Anemia - Hgb 10.4, T-sat 32%, on Aranesp 100 mcg on Tues. 5. Metabolic bone disease - Last Corrected Ca 10.3, P 6.6, iPTH 1061; Hectorol 3 mcg, Renvela 2 with meals. 6. Nutrition - Last Alb 3.6, carb-mod renal diet. 7. DM Type 2 - per primary. 8. 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  Subjective: Interval History:none Objective: Vital signs in last 24 hours: Temp:  [98 F (36.7 C)-98.9 F (37.2 C)] 98.1 F (36.7 C) (12/08 0556) Pulse Rate:  [73-87] 73 (12/08 0556) Resp:  [16-18] 17 (12/08 0556) BP: (125-141)/(53-74) 136/53 mmHg (12/08 0556) SpO2:  [94 %-100 %] 100 % (12/08 0556) Weight:  [54 kg (119 lb 0.8 oz)-55.929 kg (123 lb 4.8 oz)] 55.929 kg (123 lb 4.8 oz) (12/08 0556) Weight change:   Intake/Output from previous day:   Intake/Output this shift:    General appearance: slowed mentation, oversedated, minimally arousable Lab Results:  Recent Labs  08/10/14 1913 08/11/14 0048  WBC 13.4* 12.3*  HGB 8.2* 7.8*  HCT 25.6* 24.3*  PLT 235 216   BMET:  Recent Labs  08/10/14 1913 08/11/14 0048  NA 129* 132*  K 4.0 4.3  CL 85* 89*  CO2 27 28  GLUCOSE 422* 330*  BUN 28* 28*  CREATININE 3.47* 3.81*  CALCIUM 9.1 9.5   No results for input(s): PTH in the last 72 hours. Iron Studies: No results for input(s): IRON, TIBC, TRANSFERRIN, FERRITIN in the last 72 hours. Studies/Results: Dg Chest Port 1 View  08/10/2014   CLINICAL DATA:  Preoperative evaluation for vascular surgery. History of end-stage renal disease.  EXAM: PORTABLE CHEST - 1  VIEW  COMPARISON:  03/19/2013  FINDINGS: Previous right IJ dialysis catheter has been removed. Heart is enlarged without CHF, edema, pneumonia, collapse or consolidation. No effusion or pneumothorax. Atherosclerosis of the aorta. Trachea is midline.  IMPRESSION: Cardiomegaly without acute process   Electronically Signed   By: Ruel Favorsrevor  Shick M.D.   On: 08/10/2014 17:57   Scheduled: . darbepoetin (ARANESP) injection - DIALYSIS  100 mcg Intravenous Q Tue-HD  . docusate sodium  100 mg Oral BID  . doxercalciferol  3 mcg Intravenous Q T,Th,Sa-HD  . enoxaparin (LOVENOX) injection  30 mg Subcutaneous Q24H  . insulin aspart  0-15 Units Subcutaneous TID WC  . pantoprazole  40 mg Oral Daily  . piperacillin-tazobactam (ZOSYN)  IV  2.25 g Intravenous Q8H  . vancomycin  500 mg Intravenous Q T,Th,Sa-HD  . vancomycin  1,000 mg Intravenous Once     LOS: 1 day   Charlot Gouin C 08/11/2014,12:51 PM

## 2014-08-11 NOTE — Progress Notes (Signed)
UR Completed.  Domini Vandehei Jane [phone] 10/01/2013  

## 2014-08-11 NOTE — Progress Notes (Signed)
Pt dsg changed with assistance from RN Adriane. Reported off to incoming RN. Arabella MerlesP. Amo Kinley Ferrentino RN.

## 2014-08-12 ENCOUNTER — Inpatient Hospital Stay (HOSPITAL_COMMUNITY): Payer: Medicare Other | Admitting: Anesthesiology

## 2014-08-12 ENCOUNTER — Encounter (HOSPITAL_COMMUNITY): Payer: Self-pay | Admitting: Anesthesiology

## 2014-08-12 ENCOUNTER — Encounter (HOSPITAL_COMMUNITY)
Admission: AD | Disposition: A | Discharge status: Hospice | Payer: Self-pay | Source: Ambulatory Visit | Attending: Vascular Surgery

## 2014-08-12 DIAGNOSIS — T8789 Other complications of amputation stump: Secondary | ICD-10-CM

## 2014-08-12 HISTORY — PX: STUMP REVISION: SHX6102

## 2014-08-12 LAB — CBC
HCT: 27 % — ABNORMAL LOW (ref 36.0–46.0)
Hemoglobin: 8.1 g/dL — ABNORMAL LOW (ref 12.0–15.0)
MCH: 26.1 pg (ref 26.0–34.0)
MCHC: 30 g/dL (ref 30.0–36.0)
MCV: 87.1 fL (ref 78.0–100.0)
Platelets: 229 10*3/uL (ref 150–400)
RBC: 3.1 MIL/uL — ABNORMAL LOW (ref 3.87–5.11)
RDW: 16.9 % — AB (ref 11.5–15.5)
WBC: 10.9 10*3/uL — ABNORMAL HIGH (ref 4.0–10.5)

## 2014-08-12 LAB — GLUCOSE, CAPILLARY
GLUCOSE-CAPILLARY: 124 mg/dL — AB (ref 70–99)
GLUCOSE-CAPILLARY: 172 mg/dL — AB (ref 70–99)
Glucose-Capillary: 104 mg/dL — ABNORMAL HIGH (ref 70–99)
Glucose-Capillary: 135 mg/dL — ABNORMAL HIGH (ref 70–99)
Glucose-Capillary: 140 mg/dL — ABNORMAL HIGH (ref 70–99)

## 2014-08-12 LAB — BLOOD GAS, ARTERIAL
Acid-Base Excess: 0.3 mmol/L (ref 0.0–2.0)
BICARBONATE: 25.2 meq/L — AB (ref 20.0–24.0)
Drawn by: 41881
O2 Content: 2 L/min
O2 Saturation: 99 %
PH ART: 7.351 (ref 7.350–7.450)
Patient temperature: 98.6
TCO2: 26.6 mmol/L (ref 0–100)
pCO2 arterial: 46.7 mmHg — ABNORMAL HIGH (ref 35.0–45.0)
pO2, Arterial: 155 mmHg — ABNORMAL HIGH (ref 80.0–100.0)

## 2014-08-12 LAB — BASIC METABOLIC PANEL
Anion gap: 14 (ref 5–15)
BUN: 14 mg/dL (ref 6–23)
CO2: 27 mEq/L (ref 19–32)
CREATININE: 2.27 mg/dL — AB (ref 0.50–1.10)
Calcium: 9.8 mg/dL (ref 8.4–10.5)
Chloride: 96 mEq/L (ref 96–112)
GFR, EST AFRICAN AMERICAN: 27 mL/min — AB (ref >90)
GFR, EST NON AFRICAN AMERICAN: 23 mL/min — AB (ref >90)
Glucose, Bld: 100 mg/dL — ABNORMAL HIGH (ref 70–99)
Potassium: 4.3 mEq/L (ref 3.7–5.3)
Sodium: 137 mEq/L (ref 137–147)

## 2014-08-12 LAB — PREPARE RBC (CROSSMATCH)

## 2014-08-12 SURGERY — REVISION, AMPUTATION SITE
Anesthesia: General | Site: Leg Upper | Laterality: Left

## 2014-08-12 MED ORDER — 0.9 % SODIUM CHLORIDE (POUR BTL) OPTIME
TOPICAL | Status: DC | PRN
  Administered 2014-08-12: 1000 mL

## 2014-08-12 MED ORDER — PROMETHAZINE HCL 25 MG/ML IJ SOLN: 6.2500 mg | INTRAMUSCULAR | Status: DC | PRN

## 2014-08-12 MED ORDER — SODIUM CHLORIDE 0.9 % IV SOLN: Freq: Once | INTRAVENOUS | Status: DC

## 2014-08-12 MED ORDER — SODIUM CHLORIDE 0.9 % IV SOLN
INTRAVENOUS | Status: DC
  Administered 2014-08-12: 12:00:00 via INTRAVENOUS

## 2014-08-12 MED ORDER — ALBUMIN HUMAN 5 % IV SOLN
INTRAVENOUS | Status: AC
  Filled 2014-08-12: qty 250

## 2014-08-12 MED ORDER — HYDROMORPHONE HCL 1 MG/ML IJ SOLN
INTRAMUSCULAR | Status: AC
  Filled 2014-08-12: qty 1

## 2014-08-12 MED ORDER — ALBUMIN HUMAN 5 % IV SOLN
12.5000 g | Freq: Once | INTRAVENOUS | Status: AC
  Administered 2014-08-12: 12.5 g via INTRAVENOUS

## 2014-08-12 MED ORDER — HYDROMORPHONE HCL 1 MG/ML IJ SOLN
0.2500 mg | INTRAMUSCULAR | Status: DC | PRN
  Administered 2014-08-12: 0.5 mg via INTRAVENOUS

## 2014-08-12 MED ORDER — FENTANYL CITRATE 0.05 MG/ML IJ SOLN
INTRAMUSCULAR | Status: AC
  Filled 2014-08-12: qty 5

## 2014-08-12 MED ORDER — ETOMIDATE 2 MG/ML IV SOLN
INTRAVENOUS | Status: DC | PRN
  Administered 2014-08-12: 12 mg via INTRAVENOUS

## 2014-08-12 MED ORDER — FENTANYL CITRATE 0.05 MG/ML IJ SOLN
INTRAMUSCULAR | Status: DC | PRN
  Administered 2014-08-12 (×2): 50 ug via INTRAVENOUS
  Administered 2014-08-12: 100 ug via INTRAVENOUS
  Administered 2014-08-12: 50 ug via INTRAVENOUS

## 2014-08-12 MED ORDER — LIDOCAINE HCL (CARDIAC) 20 MG/ML IV SOLN
INTRAVENOUS | Status: DC | PRN
  Administered 2014-08-12: 50 mg via INTRAVENOUS

## 2014-08-12 MED ORDER — PIPERACILLIN-TAZOBACTAM IN DEX 2-0.25 GM/50ML IV SOLN
2.2500 g | INTRAVENOUS | Status: AC
  Administered 2014-08-12: 2.25 g via INTRAVENOUS
  Filled 2014-08-12: qty 50

## 2014-08-12 MED ORDER — ONDANSETRON HCL 4 MG/2ML IJ SOLN
INTRAMUSCULAR | Status: DC | PRN
  Administered 2014-08-12: 4 mg via INTRAVENOUS

## 2014-08-12 MED ORDER — SUCCINYLCHOLINE CHLORIDE 20 MG/ML IJ SOLN
INTRAMUSCULAR | Status: DC | PRN
  Administered 2014-08-12: 50 mg via INTRAVENOUS

## 2014-08-12 MED ORDER — ALBUMIN HUMAN 5 % IV SOLN
INTRAVENOUS | Status: DC | PRN
  Administered 2014-08-12: 15:00:00 via INTRAVENOUS

## 2014-08-12 MED ORDER — PHENYLEPHRINE HCL 10 MG/ML IJ SOLN
10.0000 mg | INTRAVENOUS | Status: DC | PRN
  Administered 2014-08-12: 20 ug/min via INTRAVENOUS

## 2014-08-12 SURGICAL SUPPLY — 44 items
BANDAGE ELASTIC 4 VELCRO ST LF (GAUZE/BANDAGES/DRESSINGS) IMPLANT
BANDAGE ELASTIC 6 VELCRO ST LF (GAUZE/BANDAGES/DRESSINGS) IMPLANT
BLADE SAW RECIP 87.9 MT (BLADE) ×3 IMPLANT
BLADE SURG 10 STRL SS (BLADE) ×6 IMPLANT
BNDG GAUZE ELAST 4 BULKY (GAUZE/BANDAGES/DRESSINGS) IMPLANT
CANISTER SUCTION 2500CC (MISCELLANEOUS) ×3 IMPLANT
CANISTER WOUND CARE 500ML ATS (WOUND CARE) ×3 IMPLANT
CONNECTOR Y ATS VAC SYSTEM (MISCELLANEOUS) ×3 IMPLANT
CONT SPEC 4OZ CLIKSEAL STRL BL (MISCELLANEOUS) ×3 IMPLANT
COVER SURGICAL LIGHT HANDLE (MISCELLANEOUS) ×3 IMPLANT
DRSG VAC ATS MED SENSATRAC (GAUZE/BANDAGES/DRESSINGS) ×3 IMPLANT
ELECT REM PT RETURN 9FT ADLT (ELECTROSURGICAL) ×3
ELECTRODE REM PT RTRN 9FT ADLT (ELECTROSURGICAL) ×1 IMPLANT
GAUZE SPONGE 4X4 12PLY STRL (GAUZE/BANDAGES/DRESSINGS) ×3 IMPLANT
GLOVE BIO SURGEON STRL SZ7.5 (GLOVE) ×3 IMPLANT
GLOVE BIOGEL PI IND STRL 6.5 (GLOVE) ×2 IMPLANT
GLOVE BIOGEL PI IND STRL 7.0 (GLOVE) ×1 IMPLANT
GLOVE BIOGEL PI INDICATOR 6.5 (GLOVE) ×4
GLOVE BIOGEL PI INDICATOR 7.0 (GLOVE) ×2
GLOVE ECLIPSE 6.5 STRL STRAW (GLOVE) ×3 IMPLANT
GLOVE SURG SS PI 6.5 STRL IVOR (GLOVE) ×3 IMPLANT
GLOVE SURG SS PI 7.0 STRL IVOR (GLOVE) ×3 IMPLANT
GOWN STRL REUS W/ TWL LRG LVL3 (GOWN DISPOSABLE) ×3 IMPLANT
GOWN STRL REUS W/TWL LRG LVL3 (GOWN DISPOSABLE) ×6
KIT BASIN OR (CUSTOM PROCEDURE TRAY) ×3 IMPLANT
KIT ROOM TURNOVER OR (KITS) ×3 IMPLANT
NS IRRIG 1000ML POUR BTL (IV SOLUTION) ×3 IMPLANT
PACK GENERAL/GYN (CUSTOM PROCEDURE TRAY) ×3 IMPLANT
PACK UNIVERSAL I (CUSTOM PROCEDURE TRAY) IMPLANT
PAD ARMBOARD 7.5X6 YLW CONV (MISCELLANEOUS) ×6 IMPLANT
STAPLER VISISTAT 35W (STAPLE) IMPLANT
SUT ETHILON 2 0 PSLX (SUTURE) ×6 IMPLANT
SUT ETHILON 3 0 PS 1 (SUTURE) IMPLANT
SUT SILK 2 0 (SUTURE) ×2
SUT SILK 2 0 SH CR/8 (SUTURE) ×3 IMPLANT
SUT SILK 2-0 18XBRD TIE 12 (SUTURE) ×1 IMPLANT
SUT VIC AB 2-0 CT1 18 (SUTURE) ×6 IMPLANT
SUT VIC AB 2-0 CTX 36 (SUTURE) ×3 IMPLANT
SUT VIC AB 2-0 SH 18 (SUTURE) ×3 IMPLANT
SUT VIC AB 3-0 SH 27 (SUTURE)
SUT VIC AB 3-0 SH 27X BRD (SUTURE) IMPLANT
SUT VICRYL 4-0 PS2 18IN ABS (SUTURE) IMPLANT
TOWEL OR 17X26 10 PK STRL BLUE (TOWEL DISPOSABLE) ×12 IMPLANT
WATER STERILE IRR 1000ML POUR (IV SOLUTION) ×3 IMPLANT

## 2014-08-12 NOTE — Progress Notes (Signed)
Nurse was unable to obtain a SPO2 or pulse reading.  Patient shallow breathing and unresponsive.  Ordered ABG to check SO2 and CO2.

## 2014-08-12 NOTE — Progress Notes (Signed)
Patient very lethargic, shallow breathing. Patient responds to pain by opening eyes but no verbal response. Unable to get accurate O2 saturation reading on hand, ear, or forehead. Respiratory assessed patient. Ordered a Blood gas draw and placed patient on 4L of Oxygen. Will continue to monitor.   Valinda HoarLexie Kaytlen Lightsey RN

## 2014-08-12 NOTE — H&P (View-Only) (Signed)
[facility] [place]  Subjective  - Pt lethargic but rousable oriented only to name   Objective 136/53 73 98.1 F (36.7 C) (Oral) 17 100% No intake or output data in the 24 hours ending 08/11/14 0755  Left AKA with epidermal loss and poor healing stump unchanged  Assessment/Planning: Non healing left AKA debridement tomorrow Central access from critical care service today for IV antibiotics Continue local wound care Minimize sedating meds Hyperglycemia sliding scale  Nunzio Banet E 08/11/2014 7:55 AM --  Laboratory Lab Results:  Recent Labs  08/10/14 1913 08/11/14 0048  WBC 13.4* 12.3*  HGB 8.2* 7.8*  HCT 25.6* 24.3*  PLT 235 216   BMET  Recent Labs  08/10/14 1913 08/11/14 0048  NA 129* 132*  K 4.0 4.3  CL 85* 89*  CO2 27 28  GLUCOSE 422* 330*  BUN 28* 28*  CREATININE 3.47* 3.81*  CALCIUM 9.1 9.5    COAG Lab Results  Component Value Date   INR 1.43 08/10/2014   INR 1.15 05/24/2010   No results found for: PTT

## 2014-08-12 NOTE — Transfer of Care (Signed)
Immediate Anesthesia Transfer of Care Note  Patient: [name]  Procedure(s) Performed: Procedure(s): LEFT ABOVE KNEE AMPUTATION STUMP REVISION (Left)  Patient Location: PACU  Anesthesia Type:General  Level of Consciousness: awake, alert  and oriented  Airway & Oxygen Therapy: Patient Spontanous Breathing and Patient connected to face mask oxygen  Post-op Assessment: Report given to PACU RN  Post vital signs: Reviewed and stable  Complications: No apparent anesthesia complications

## 2014-08-12 NOTE — Anesthesia Postprocedure Evaluation (Signed)
  Anesthesia Post-op Note  Patient: [name]  Procedure(s) Performed: Procedure(s): LEFT ABOVE KNEE AMPUTATION STUMP REVISION (Left)  Patient Location: PACU  Anesthesia Type:General  Level of Consciousness: awake and alert   Airway and Oxygen Therapy: Patient Spontanous Breathing  Post-op Pain: mild  Post-op Assessment: Post-op Vital signs reviewed  Post-op Vital Signs: stable  Last Vitals:  Filed Vitals:   08/12/14 1800  BP: 94/45  Pulse:   Temp: 36.8 C  Resp: 17    Complications: No apparent anesthesia complications

## 2014-08-12 NOTE — Op Note (Signed)
[facility] OPERATIVE NOTE  [facility] OPERATIVE NOTE    Procedure: Revision left above knee amputation with VAC placement  Preoperative diagnosis: Non healing AKA Postoperative diagnosis: same  Eplin(s): Sherren Kernsharles E Reon Hunley, MD  ASSISTANT: Emerson Monteina Clayton RNFA  Anesthesia: General  Specimens: Right leg tissue for culture  PROCEDURE DETAIL: After obtaining informed consent, the patient was taken to the operating room. The patient was placed in supine position the operating room table. After induction of general anesthesia and endotracheal intubation the patient's entire left lower extremity was prepped and draped in usual sterile fashion. The entire lower half of the above knee amputation had become necrotic.  A circumferential incision was made on the left leg above the line of skin necrosis which was approximately 10 cm below the groin crease. The incision was carried down into the sucutaneous tissues down to level the muscle.  There was a large amount of necrotic muscle and subcutaneous tissue.  This was all debrided back to healthy viable tissue.  The sciatic nerve was pulled down into the operative field and transected and allowed to retract back into the high thigh. The superficial femoral artery and vein were suture ligated high in the thigh.  This was ligated and divided between silk ties. Soft tissues were taken down as well as the muscle and fascia with cautery.  Remainder of the soft tissues were taken down with cautery until the wound was overall clean with what appeared to be viable tissue. The periosteum was raised on the femur approximately 10 cm above the previous line of division and 5 cm above the skin edge. The femur was divided at this level. Some of the necrotic tissue was passed off the table as a specimen for culture. Hemostasis was obtained. The wound was thoroughly irrigated with normal saline solution. I  Had enough viable skin soft  tissue and muscle posteriorly to create a posterior flap to close over the bone and vessels. The fascial edges were reapproximated using interrupted 2 0 Vicryl sutures. The skin was loosely approximated in the middle 1/3 of the incision.  A VAC dressing was then placed over the open portions of the AKA laterally and medially and placed on 125 mm Hg suction. The patient tolerated the procedure well and there were no complications. Instrument sponge and needle counts were correct at the end of the case. Patient was taken to recovery in stable condition.  Fabienne Brunsharles Aizza Santiago, MD [facility] [place] Office: [phone] Pager: [phone]

## 2014-08-12 NOTE — Interval H&P Note (Signed)
History and Physical Interval Note:  08/12/2014 10:02 AM  [name]  has presented today for surgery, with the diagnosis of nonhealing aka stump incision T81.89X  The various methods of treatment have been discussed with the patient and family. After consideration of risks, benefits and other options for treatment, the patient has consented to  Procedure(s): DEBRIDEMENT LEFT AKA STUMP (Left) as a surgical intervention .  The patient's history has been reviewed, patient examined, no change in status, stable for surgery.  I have reviewed the patient's chart and labs.  Questions were answered to the patient's satisfaction.     FIELDS,CHARLES E

## 2014-08-12 NOTE — Progress Notes (Signed)
INITIAL NUTRITION ASSESSMENT  DOCUMENTATION CODES Per approved criteria  -Not Applicable   INTERVENTION: Advance diet as medically appropriate, add interventions accordingly RD to follow for nutrition care plan  NUTRITION DIAGNOSIS: Increased nutrient needs related to ESRD on HD, wound healing as evidenced by estimated nutrition needs   Goal: Pt to meet >/= 90% of their estimated nutrition needs   Monitor:  PO & supplemental intake, weight, labs, I/O's  Reason for Assessment: Malnutrition Screening Tool Report  53 y.o. female  Admitting Dx: left AKA wound infection  ASSESSMENT: 53 y.o. female with PMH of DM, HTN, PVD, and ESRD who had right AKA in 05/2010 and left BKA in 06/2010; s/p L AKA 07/06/14; [facility] for IV ABX and debridement of left AKA stump incision.  RD unable to obtain nutrition hx or complete Nutrition Focused Physical Exam.  Pt currently in OR.  Chart reviewed.  Prior to NPO status, pt was on a Renal/Carbohydrate Modified diet.  No % PO intake records available.   Nutrient needs increased given ESRD & left AKA wound infection.  Would benefit from addition of oral nutrition supplements when/as able.  RD to monitor.  Height: Ht Readings from Last 1 Encounters:  08/10/14 5\' 4"  (1.626 m)    Weight: Wt Readings from Last 1 Encounters:  08/12/14 116 lb 10 oz (52.9 kg)    Ideal Body Weight: 96 lb -- adjusted for amputations  % Ideal Body Weight: 121%  Wt Readings from Last 10 Encounters:  08/12/14 116 lb 10 oz (52.9 kg)  08/10/14 119 lb 0.8 oz (54 kg)  08/06/14 120 lb (54.432 kg)  07/29/14 120 lb (54.432 kg)  07/28/14 119 lb 0.8 oz (54.001 kg)  07/09/14 120 lb 2.4 oz (54.5 kg)  06/29/14 122 lb 2.2 oz (55.4 kg)  06/19/14 153 lb (69.4 kg)  06/16/14 150 lb (68.04 kg)  06/04/14 159 lb (72.122 kg)    Usual Body Weight: 153 lb  % Usual Body Weight: 75% (s/p L AKA)  BMI:  25.2 kg/m2 -- adjusted for amputations  Estimated Nutritional Needs: Kcal:  1800-2000 Protein: 90-100 gm Fluid: 1200 ml  Skin: L AKA stump incision   Diet Order: Diet NPO time specified Except for: Sips with Meds  EDUCATION NEEDS: -No education needs identified at this time   Intake/Output Summary (Last 24 hours) at 08/12/14 1420 Last data filed at 08/11/14 1720  Gross per 24 hour  Intake      0 ml  Output   2816 ml  Net  -2816 ml    Labs:   Recent Labs Lab 08/11/14 0048 08/11/14 1300 08/12/14 0410  NA 132* 132* 137  K 4.3 4.9 4.3  CL 89* 90* 96  CO2 28 29 27   BUN 28* 34* 14  CREATININE 3.81* 4.51* 2.27*  CALCIUM 9.5 9.6 9.8  PHOS 5.7* 6.5*  --   GLUCOSE 330* 269* 100*    CBG (last 3)   Recent Labs  08/12/14 0603 08/12/14 1116 08/12/14 1311  GLUCAP 104* 135* 124*    Scheduled Meds: . [MAR Hold] darbepoetin (ARANESP) injection - DIALYSIS  100 mcg Intravenous Q Tue-HD  . [MAR Hold] docusate sodium  100 mg Oral BID  . [MAR Hold] doxercalciferol  3 mcg Intravenous Q T,Th,Sa-HD  . [MAR Hold] enoxaparin (LOVENOX) injection  30 mg Subcutaneous Q24H  . [MAR Hold] insulin aspart  0-15 Units Subcutaneous TID WC  . [MAR Hold] pantoprazole  40 mg Oral Daily  . [MAR Hold] piperacillin-tazobactam (  ZOSYN)  IV  2.25 g Intravenous Q8H  . [MAR Hold] vancomycin  500 mg Intravenous Q T,Th,Sa-HD  . vancomycin  1,000 mg Intravenous Once    Continuous Infusions: . sodium chloride    . sodium chloride 10 mL/hr at 08/12/14 1212    Past Medical History  Diagnosis Date  . Hypertension   . Anemia   . Systolic CHF, acute on chronic   . Coronary artery disease   . CHF (congestive heart failure)   . Angina   . Blood transfusion ~2003; 08/12/2012    "when I started on dialysis; lower gi bleeding"  . Hypercholesteremia   . Acute lower GI bleeding 08/12/2012  . COPD (chronic obstructive pulmonary disease)   . Sickle cell trait   . Heart murmur     some say yes , some say no  . Pneumonia 08/2012  . Peripheral vascular disease   . DVT of leg  (deep venous thrombosis) 2011    "RLE"  . Type II diabetes mellitus   . Shortness of breath 01/02/12    "@rest ; lying down; w/exertion"; no change 08/12/2012  . ESRD on dialysis     "[facility]" (08/10/2014)    Past Surgical History  Procedure Laterality Date  . Leg amputation below knee Left 06/2010  . 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 Av fistula placement Left ~ 2003    upper arm  . Abdominal hysterectomy  1990's  . Arteriovenous graft placement Left 2011    upper arm  . Vascular surgery    . Cardiac catheterization  ~ 2010  . Cesarean section  1988  . Esophagogastroduodenoscopy  08/12/2012    Procedure: ESOPHAGOGASTRODUODENOSCOPY (EGD);  Titus: Vertell NovakJames L Edwards Jr., MD;  [facility];  Service: Endoscopy;  Laterality: N/A;  . Colonoscopy  08/13/2012    Procedure: COLONOSCOPY;  Ament: Vertell NovakJames L Edwards Jr., MD;  [facility];  Service: Endoscopy;  Laterality: N/A;  . 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 Insertion of dialysis catheter N/A 03/19/2013    Procedure: INSERTION OF DIALYSIS CATHETER;  Badami: Fransisco HertzBrian L Chen, MD;  [facility];  Service: Vascular;  Laterality: N/A;  Right Internal Jugular Placement  . Revision of arteriovenous goretex graft Left 03/19/2013    Procedure: REVISION OF ARTERIOVENOUS GORETEX GRAFT;  Yglesias: Fransisco HertzBrian L Chen, MD;  [facility];  Service: Vascular;  Laterality: Left;  using 6mm x 20cm Gore-Tex Graft  . Amputation Left 07/06/2014    Procedure: Left AMPUTATION ABOVE KNEE;  Karaffa: Sherren Kernsharles E Fields, MD;  [facility];  Service: Vascular;  Laterality: Left;    Maureen ChattersKatie Lunah Losasso, RD, LDN Pager #: [phone] After-Hours Pager #: [phone]

## 2014-08-12 NOTE — Progress Notes (Signed)
Pt placed on Tele monitor.  Alert and orient, but slow to answer questions.

## 2014-08-12 NOTE — Progress Notes (Signed)
Inpatient Diabetes Program Recommendations  AACE/ADA: New Consensus Statement on Inpatient Glycemic Control (2013)  Target Ranges:  Prepandial:   less than 140 mg/dL      Peak postprandial:   less than 180 mg/dL (1-2 hours)      Critically ill patients:  140 - 180 mg/dL   Reason for Assessment:  Results for [name], [name] (MRN [id number]) as of 08/12/2014 13:27  Ref. Range 08/10/2014 16:51 08/10/2014 20:05 08/11/2014 05:53 08/11/2014 11:22 08/11/2014 21:02 08/12/2014 06:03 08/12/2014 11:16 08/12/2014 13:11  Glucose-Capillary Latest Range: 70-99 mg/dL 409407 (H) 811384 (H) 914285 (H) 252 (H) 148 (H) 104 (H) 135 (H) 124 (H)  Results for [name], [name] (MRN [id number]) as of 08/12/2014 13:27  Ref. Range 08/11/2014 08:30  Hgb A1c MFr Bld Latest Range: <5.7 % 10.6 (H)   Diabetes history: Type 2 diabetes Outpatient Diabetes medications: Lantus 16 units q HS Current orders for Inpatient glycemic control:  Novolog moderate tid with meals  Note patient has not received any insulin since admission and CBG's have decreased.  Consider decreasing Novolog to sensitive tid with meals.  Also consider adding Lantus 6 units q HS.  Thanks, Beryl MeagerJenny Elmar Antigua, RN, BC-ADM Inpatient Diabetes Coordinator Pager [phone]

## 2014-08-12 NOTE — Anesthesia Preprocedure Evaluation (Signed)
Anesthesia Evaluation  Patient identified by MRN, date of birth, ID band Patient awake    Reviewed: Allergy & Precautions, H&P , NPO status , Patient's Chart, lab work & pertinent test results, reviewed documented beta blocker date and time   Airway Mallampati: I  TM Distance: >3 FB Neck ROM: full    Dental  (+) Teeth Intact, Dental Advidsory Given   Pulmonary shortness of breath, COPD breath sounds clear to auscultation        Cardiovascular hypertension, Pt. on medications + angina with exertion + CAD, + Peripheral Vascular Disease and +CHF + Valvular Problems/Murmurs MR Rhythm:regular Rate:Normal     Neuro/Psych    GI/Hepatic   Endo/Other  diabetes, Type 2, Insulin Dependent  Renal/GU      Musculoskeletal   Abdominal   Peds  Hematology  (+) anemia ,   Anesthesia Other Findings Patient stated natural teeth, dentures upper found on induction. - Left ventricle: The cavity size was normal. Wall thickness was increased in a pattern of severe LVH. Systolic function was severely reduced. The estimated ejection fraction was in the range of 20% to 25%. Diffuse hypokinesis. Features are consistent with a pseudonormal left ventricular filling pattern, with concomitant abnormal relaxation and increased filling pressure (grade 2 diastolic dysfunction). Doppler parameters are consistent with high ventricular filling pressure. - Mitral valve: There was severe regurgitation. - Left atrium: The atrium was moderately dilated. - Right ventricle: Systolic function was mildly reduced. - Tricuspid valve: There was moderate regurgitation. - Pulmonary arteries: Systolic pressure was severely increased. PA peak pressure: 67 mm Hg (S).   Reproductive/Obstetrics                             Anesthesia Physical Anesthesia Plan  ASA: III  Anesthesia Plan: General ETT   Post-op Pain  Management:    Induction: Intravenous  Airway Management Planned:   Additional Equipment:   Intra-op Plan:   Post-operative Plan: Extubation in OR  Informed Consent:   Plan Discussed with:   Anesthesia Plan Comments:         Anesthesia Quick Evaluation

## 2014-08-12 NOTE — Progress Notes (Signed)
Assessment/Plan: 1. Non-healing L AKA - significant pain, started outpatient Vancomycin & Elita QuickFortaz 11/28 per HD, now Vancomycin & Zosyn; L AKA 11/2 per Dr.Fields, debridement scheduled 12/9. 2. ESRD - HD on MTTS @ [facility], but recently signing off early (2:15 today at outpatient center). HD today 3. 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  Subjective: Interval History: For OR today  Objective: Vital signs in last 24 hours: Temp:  [97.8 F (36.6 C)-98.4 F (36.9 C)] 98 F (36.7 C) (12/09 0654) Pulse Rate:  [66-89] 89 (12/09 0654) Resp:  [13-22] 20 (12/09 0654) BP: (78-163)/(27-88) 154/58 mmHg (12/09 0654) SpO2:  [100 %] 100 % (12/09 0654) Weight:  [52.2 kg (115 lb 1.3 oz)-56.2 kg (123 lb 14.4 oz)] 52.9 kg (116 lb 10 oz) (12/09 0654) Weight change: 0.317 kg (11.2 oz)  Intake/Output from previous day: 12/08 0701 - 12/09 0700 In: -  Out: 2816  Intake/Output this shift:    General appearance: uncomfortable from pain GI: soft, non-tender; bowel sounds normal; no masses,  no organomegaly Extremities: bilat amputee  Lab Results:  Recent Labs  08/11/14 1300 08/12/14 0410  WBC 11.7* 10.9*  HGB 7.4* 8.1*  HCT 23.5* 27.0*  PLT 212 229   BMET:  Recent Labs  08/11/14 1300 08/12/14 0410  NA 132* 137  K 4.9 4.3  CL 90* 96  CO2 29 27  GLUCOSE 269* 100*  BUN 34* 14  CREATININE 4.51* 2.27*  CALCIUM 9.6 9.8   No results for input(s): PTH in the last 72 hours. Iron Studies: No results for input(s): IRON, TIBC, TRANSFERRIN, FERRITIN in the last 72 hours. Studies/Results: Dg Chest Port 1 View  08/10/2014   CLINICAL DATA:  Preoperative evaluation for vascular surgery. History of end-stage renal disease.  EXAM: PORTABLE CHEST - 1 VIEW  COMPARISON:  03/19/2013  FINDINGS: Previous right IJ dialysis catheter has been removed. Heart is enlarged without CHF, edema, pneumonia, collapse or consolidation. No effusion or pneumothorax. Atherosclerosis of the aorta. Trachea is midline.   IMPRESSION: Cardiomegaly without acute process   Electronically Signed   By: Ruel Favorsrevor  Shick M.D.   On: 08/10/2014 17:57   Scheduled: . darbepoetin (ARANESP) injection - DIALYSIS  100 mcg Intravenous Q Tue-HD  . docusate sodium  100 mg Oral BID  . doxercalciferol  3 mcg Intravenous Q T,Th,Sa-HD  . enoxaparin (LOVENOX) injection  30 mg Subcutaneous Q24H  . insulin aspart  0-15 Units Subcutaneous TID WC  . pantoprazole  40 mg Oral Daily  . piperacillin-tazobactam (ZOSYN)  IV  2.25 g Intravenous Q8H  . vancomycin  500 mg Intravenous Q T,Th,Sa-HD  . vancomycin  1,000 mg Intravenous Once     LOS: 2 days   [name] C 08/12/2014,10:22 AM

## 2014-08-13 ENCOUNTER — Other Ambulatory Visit: Payer: Self-pay | Admitting: *Deleted

## 2014-08-13 ENCOUNTER — Other Ambulatory Visit: Payer: Self-pay

## 2014-08-13 ENCOUNTER — Encounter (HOSPITAL_COMMUNITY): Payer: Self-pay | Admitting: Vascular Surgery

## 2014-08-13 ENCOUNTER — Inpatient Hospital Stay (HOSPITAL_COMMUNITY): Payer: Medicare Other

## 2014-08-13 DIAGNOSIS — J9620 Acute and chronic respiratory failure, unspecified whether with hypoxia or hypercapnia: Secondary | ICD-10-CM

## 2014-08-13 DIAGNOSIS — G9340 Encephalopathy, unspecified: Secondary | ICD-10-CM

## 2014-08-13 DIAGNOSIS — A419 Sepsis, unspecified organism: Secondary | ICD-10-CM

## 2014-08-13 LAB — COMPREHENSIVE METABOLIC PANEL WITH GFR
ALT: 114 U/L — ABNORMAL HIGH (ref 0–35)
AST: 453 U/L — ABNORMAL HIGH (ref 0–37)
Albumin: 2.1 g/dL — ABNORMAL LOW (ref 3.5–5.2)
Alkaline Phosphatase: 67 U/L (ref 39–117)
Anion gap: 22 — ABNORMAL HIGH (ref 5–15)
BUN: 8 mg/dL (ref 6–23)
CO2: 22 meq/L (ref 19–32)
Calcium: 9.1 mg/dL (ref 8.4–10.5)
Chloride: 94 meq/L — ABNORMAL LOW (ref 96–112)
Creatinine, Ser: 1.3 mg/dL — ABNORMAL HIGH (ref 0.50–1.10)
GFR calc Af Amer: 53 mL/min — ABNORMAL LOW
GFR calc non Af Amer: 45 mL/min — ABNORMAL LOW
Glucose, Bld: 264 mg/dL — ABNORMAL HIGH (ref 70–99)
Potassium: 3.7 meq/L (ref 3.7–5.3)
Sodium: 138 meq/L (ref 137–147)
Total Bilirubin: 0.6 mg/dL (ref 0.3–1.2)
Total Protein: 6.5 g/dL (ref 6.0–8.3)

## 2014-08-13 LAB — GLUCOSE, CAPILLARY
GLUCOSE-CAPILLARY: 182 mg/dL — AB (ref 70–99)
Glucose-Capillary: 117 mg/dL — ABNORMAL HIGH (ref 70–99)
Glucose-Capillary: 64 mg/dL — ABNORMAL LOW (ref 70–99)
Glucose-Capillary: 143 mg/dL — ABNORMAL HIGH (ref 70–99)
Glucose-Capillary: 65 mg/dL — ABNORMAL LOW (ref 70–99)
Glucose-Capillary: 226 mg/dL — ABNORMAL HIGH (ref 70–99)

## 2014-08-13 LAB — CBC
HCT: 17.7 % — ABNORMAL LOW (ref 36.0–46.0)
Hemoglobin: 5.3 g/dL — CL (ref 12.0–15.0)
MCH: 26.1 pg (ref 26.0–34.0)
MCHC: 29.9 g/dL — ABNORMAL LOW (ref 30.0–36.0)
MCV: 87.2 fL (ref 78.0–100.0)
Platelets: 184 10*3/uL (ref 150–400)
RBC: 2.03 MIL/uL — ABNORMAL LOW (ref 3.87–5.11)
RDW: 17.2 % — ABNORMAL HIGH (ref 11.5–15.5)
WBC: 10.7 10*3/uL — ABNORMAL HIGH (ref 4.0–10.5)

## 2014-08-13 LAB — BLOOD GAS, ARTERIAL
Acid-Base Excess: 2.4 mmol/L — ABNORMAL HIGH (ref 0.0–2.0)
Bicarbonate: 25.4 meq/L — ABNORMAL HIGH (ref 20.0–24.0)
Drawn by: 313941
FIO2: 1 %
O2 Saturation: 100 %
Patient temperature: 99.6
TCO2: 26.4 mmol/L (ref 0–100)
pCO2 arterial: 33.8 mmHg — ABNORMAL LOW (ref 35.0–45.0)
pH, Arterial: 7.492 — ABNORMAL HIGH (ref 7.350–7.450)
pO2, Arterial: 359 mmHg — ABNORMAL HIGH (ref 80.0–100.0)

## 2014-08-13 MED ORDER — COLLAGENASE 250 UNIT/GM EX OINT
TOPICAL_OINTMENT | Freq: Every day | CUTANEOUS | Status: DC
  Administered 2014-08-13 – 2014-08-15 (×3): via TOPICAL
  Administered 2014-08-16: 1 via TOPICAL
  Administered 2014-08-17: 11:00:00 via TOPICAL
  Administered 2014-08-18: 1 via TOPICAL
  Filled 2014-08-13 (×4): qty 30

## 2014-08-13 MED ORDER — NALOXONE HCL 0.4 MG/ML IJ SOLN
INTRAMUSCULAR | Status: AC
  Administered 2014-08-13: 0.4 mg
  Filled 2014-08-13: qty 1

## 2014-08-13 MED ORDER — DOXERCALCIFEROL 4 MCG/2ML IV SOLN
INTRAVENOUS | Status: AC
  Administered 2014-08-13: 3 ug via INTRAVENOUS
  Filled 2014-08-13: qty 2

## 2014-08-13 MED ORDER — DEXTROSE 50 % IV SOLN
INTRAVENOUS | Status: AC
  Administered 2014-08-13: 13:00:00
  Filled 2014-08-13: qty 50

## 2014-08-13 MED ORDER — DEXTROSE 250 MG/ML IV SOLN
0.5000 g/kg | Freq: Once | INTRAVENOUS | Status: AC
  Administered 2014-08-13: 24.7 g via INTRAVENOUS

## 2014-08-13 MED ORDER — IOHEXOL 350 MG/ML SOLN
100.0000 mL | Freq: Once | INTRAVENOUS | Status: AC | PRN
  Administered 2014-08-13: 100 mL via INTRAVENOUS

## 2014-08-13 MED ORDER — LIDOCAINE-PRILOCAINE 2.5-2.5 % EX CREA
1.0000 "application " | TOPICAL_CREAM | CUTANEOUS | Status: DC | PRN
  Filled 2014-08-13: qty 5

## 2014-08-13 MED ORDER — DEXTROSE 50 % IV SOLN
INTRAVENOUS | Status: AC
  Administered 2014-08-13: 50 mL
  Filled 2014-08-13: qty 50

## 2014-08-13 MED ORDER — ALTEPLASE 2 MG IJ SOLR
2.0000 mg | Freq: Once | INTRAMUSCULAR | Status: AC | PRN
  Filled 2014-08-13: qty 2

## 2014-08-13 MED ORDER — PENTAFLUOROPROP-TETRAFLUOROETH EX AERO: 1.0000 "application " | INHALATION_SPRAY | CUTANEOUS | Status: DC | PRN

## 2014-08-13 MED ORDER — NEPRO/CARBSTEADY PO LIQD
237.0000 mL | Freq: Three times a day (TID) | ORAL | Status: DC
  Filled 2014-08-13 (×8): qty 237

## 2014-08-13 MED ORDER — NEPRO/CARBSTEADY PO LIQD
1000.0000 mL | ORAL | Status: DC
  Filled 2014-08-13 (×2): qty 1000

## 2014-08-13 MED ORDER — SODIUM CHLORIDE 0.9 % IV SOLN: 100.0000 mL | INTRAVENOUS | Status: DC | PRN

## 2014-08-13 MED ORDER — HEPARIN SODIUM (PORCINE) 1000 UNIT/ML DIALYSIS
20.0000 [IU]/kg | INTRAMUSCULAR | Status: DC | PRN
  Filled 2014-08-13: qty 2

## 2014-08-13 MED ORDER — HEPARIN SODIUM (PORCINE) 1000 UNIT/ML DIALYSIS
1000.0000 [IU] | INTRAMUSCULAR | Status: DC | PRN
  Filled 2014-08-13: qty 1

## 2014-08-13 MED ORDER — NEPRO/CARBSTEADY PO LIQD: 1000.0000 mL | ORAL | Status: DC

## 2014-08-13 MED ORDER — LIDOCAINE HCL (PF) 1 % IJ SOLN: 5.0000 mL | INTRAMUSCULAR | Status: DC | PRN

## 2014-08-13 MED ORDER — NEPRO/CARBSTEADY PO LIQD
237.0000 mL | ORAL | Status: DC | PRN
  Filled 2014-08-13: qty 237

## 2014-08-13 NOTE — Significant Event (Signed)
Rapid Response Event Note  Overview:  Called by RN to assist with patient with decreased LOC Time Called: 1217 Arrival Time: 1220 Event Type: Neurologic  Initial Focused Assessment: RN Paulette states patient just back from HD and is unresponsive.  Day staff did not see patient today as she went to HD at change of shift.  On arrival patient lying on left side - shallow resps - oral mucosa extremely dry and very pale - unresponsive to sternal rub - BP 112/60 HR 89 RR 10 shallow - RN states no pain meds or sedatives have been given- HD RN reported patient was very lethargic during treatment.  Unable to obtain O2 sat - poor waveform - extremities cool = core warm - CBG 64.  Dr. Darrick PennaFields contacted per staff.   Left leg stump site DDI - VAC dressing intact x2 -swelling noted but site soft.  Unsure of patients baseline neuro status - according to chart was alert and oriented yesterday - increasing lethargy since.    Interventions: IV assessed - stat D50 one amp IV given for low blood sugar and patient sx with unresponsiveness and shallow breathing.  HOB up - suction set up - patient with no gag reflex with suction.  Begins to open eyes to sternal rub - loud voice - does not focus or follow commands - seems equally weak on both sides - unable to appreciate a focal deficit.  Doreatha MassedSamantha Rhyne PA present in room.  Stat ABG ordered.  Repeat CBG 65.  1/2 amp D50 administered.  Will open eyes to sternal rub - does not focus - does not follow commands.  ABG drawn - blood bright red - changed NRB mask to 3 liter nasal cannula.  Occasional spont movement right arm.  Stat labs ordered per PA.  Repeat cbg 143.  Rectal temp 99.7.  Remains very lethargic - occasional moan but when turned to side did not respond with yelling and screaming as in past.  Transfterred to 2h17.  Resps less shallow - still little gag reflex - oral care done.  .     Event Summary: Name of Physician Notified: Dr. Darrick PennaFields at  (prior to RRT call)    at     Outcome: Transferred (Comment)  Event End Time: 1315  Delton PrairieBritt, Shaynah Hund L

## 2014-08-13 NOTE — Progress Notes (Signed)
Called by Dr. Darrick PennaFields to see pt as he was called by rapid response.  She was found to be unresponsive with a glucose of 64.  She was given an 1.5 amps of D5 and become more responsive.  Her glucose is now 143.  Upon arrival, her systolic BP was 118 and is now 130's systolic.  She is on a non-rebreather and her sats are registering in the 60's.  Unsure if this is accurate as her fingers are cold.  She was lethargic yesterday and an accurate O2 saturation was unable to be obtained. A blood gas was obtained with the following results:  Arterial Blood Gas result:  pO2 155; pCO2 46.7; pH 7.35;  HCO3 25, %O2 Sat 99%.  When Dr. Darrick PennaFields saw her this morning, she would respond to sternal rub and moan.  She is now responding to sternal rub and opening her eyes.      A stat CTA for PE has been ordered as well as an ABG and she will be transferred to the ICU.  Stat CBC and CMET have also been ordered.  She is receiving Lovenox for DVT prophylaxis.  All narcotics have been discontinued.    Doreatha MassedRHYNE, Nema Oatley 08/13/2014 12:59 PM

## 2014-08-13 NOTE — Progress Notes (Signed)
NUTRITION CONSULT/FOLLOW UP   DOCUMENTATION CODES Per approved criteria  -Not Applicable   INTERVENTION: Once feeding tube placed, initiate Nepro formula at 15 ml/hr and increase by 10 ml every 4 hours to goal rate of 45 ml/hr to provide 1944 kcals, 87 gm protein, 785 ml of free water RD to follow for nutrition care plan  NUTRITION DIAGNOSIS: Increased nutrient needs related to ESRD on HD, wound healing as evidenced by estimated nutrition needs, ongoing  Goal: Pt to meet >/= 90% of their estimated nutrition needs, currently unmet  Monitor:  TF regimen & tolerance, PO intake, weight, labs, I/O's  ASSESSMENT: 53 y.o. female with PMH of DM, HTN, PVD, and ESRD who had right AKA in 05/2010 and left BKA in 06/2010; s/p L AKA 07/06/14; [facility] and debridement of left AKA stump incision.  Initial nutrition assessment completed 12/9.  Pt s/p procedure 12/9: REVISION LEFT ABOVE KNEE AMPUTATION  VAC PLACEMENT  [facility] 2W-Cardiac after unresponsive event (glucose of 64).  Panda tube placement pending.  RD consulted for TF initiation & management.  Height: Ht Readings from Last 1 Encounters:  08/10/14 5\' 4"  (1.626 m)    Weight: Wt Readings from Last 1 Encounters:  08/13/14 108 lb 14.5 oz (49.4 kg)    BMI:  25.2 kg/m2 -- adjusted for amputations  Estimated Nutritional Needs: Kcal: 1800-2000 Protein: 90-100 gm Fluid: 1200 ml  Skin: L AKA stump incision   Diet Order: Diet renal/carb modified with 1200 ml fluid restriction   Intake/Output Summary (Last 24 hours) at 08/13/14 1353 Last data filed at 08/13/14 1135  Gross per 24 hour  Intake    750 ml  Output    973 ml  Net   -223 ml    Labs:   Recent Labs Lab 08/11/14 0048 08/11/14 1300 08/12/14 0410  NA 132* 132* 137  K 4.3 4.9 4.3  CL 89* 90* 96  CO2 28 29 27   BUN 28* 34* 14  CREATININE 3.81* 4.51* 2.27*  CALCIUM 9.5 9.6 9.8  PHOS 5.7* 6.5*  --   GLUCOSE  330* 269* 100*    CBG (last 3)   Recent Labs  08/12/14 2058 08/13/14 0615 08/13/14 1214  GLUCAP 172* 117* 64*    Scheduled Meds: . darbepoetin (ARANESP) injection - DIALYSIS  100 mcg Intravenous Q Tue-HD  . docusate sodium  100 mg Oral BID  . doxercalciferol  3 mcg Intravenous Q T,Th,Sa-HD  . enoxaparin (LOVENOX) injection  30 mg Subcutaneous Q24H  . feeding supplement (NEPRO CARB STEADY)  237 mL Oral TID WC  . insulin aspart  0-15 Units Subcutaneous TID WC  . pantoprazole  40 mg Oral Daily  . piperacillin-tazobactam (ZOSYN)  IV  2.25 g Intravenous Q8H  . vancomycin  500 mg Intravenous Q T,Th,Sa-HD  . vancomycin  1,000 mg Intravenous Once    Continuous Infusions: . sodium chloride 10 mL/hr at 08/12/14 1212    Past Medical History  Diagnosis Date  . Hypertension   . Anemia   . Systolic CHF, acute on chronic   . Coronary artery disease   . CHF (congestive heart failure)   . Angina   . Blood transfusion ~2003; 08/12/2012    "when I started on dialysis; lower gi bleeding"  . Hypercholesteremia   . Acute lower GI bleeding 08/12/2012  . COPD (chronic obstructive pulmonary disease)   . Sickle cell trait   . Heart murmur  some say yes , some say no  . Pneumonia 08/2012  . Peripheral vascular disease   . DVT of leg (deep venous thrombosis) 2011    "RLE"  . Type II diabetes mellitus   . Shortness of breath 01/02/12    "@rest ; lying down; w/exertion"; no change 08/12/2012  . ESRD on dialysis     [facility] (08/10/2014)    Past Surgical History  Procedure Laterality Date  . Leg amputation below knee Left 06/2010  . 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 Av fistula placement Left ~ 2003    upper arm  . Abdominal hysterectomy  1990's  . Arteriovenous graft placement Left 2011    upper arm  . Vascular surgery    . Cardiac catheterization  ~ 2010  . Cesarean section  1988  . Esophagogastroduodenoscopy  08/12/2012    Procedure:  ESOPHAGOGASTRODUODENOSCOPY (EGD);  Esqueda: Vertell NovakJames L Edwards Jr., MD;  [facility];  Service: Endoscopy;  Laterality: N/A;  . Colonoscopy  08/13/2012    Procedure: COLONOSCOPY;  Pirozzi: Vertell NovakJames L Edwards Jr., MD;  [facility];  Service: Endoscopy;  Laterality: N/A;  . Resection of arteriovenous fistula aneurysm Left 03/19/2013    Procedure: EXCISION OF LEFT BRACHIOCEPHALIC ARTERIOVENOUS FISTULA PSEUDOANEURYSM;  Finkler: Fransisco HertzBrian L Chen, MD;  [facility];  Service: Vascular;  Laterality: Left;  . Insertion of dialysis catheter N/A 03/19/2013    Procedure: INSERTION OF DIALYSIS CATHETER;  Hurrell: Fransisco HertzBrian L Chen, MD;  [facility];  Service: Vascular;  Laterality: N/A;  Right Internal Jugular Placement  . Revision of arteriovenous goretex graft Left 03/19/2013    Procedure: REVISION OF ARTERIOVENOUS GORETEX GRAFT;  Maston: Fransisco HertzBrian L Chen, MD;  [facility];  Service: Vascular;  Laterality: Left;  using 6mm x 20cm Gore-Tex Graft  . Amputation Left 07/06/2014    Procedure: Left AMPUTATION ABOVE KNEE;  Strack: Sherren Kernsharles E Fields, MD;  [facility];  Service: Vascular;  Laterality: Left;  . Abdominal aortagram N/A 06/19/2014    Procedure: ABDOMINAL AORTAGRAM;  Fratto: Sherren Kernsharles E Fields, MD;  [facility];  Service: Cardiovascular;  Laterality: N/A;    Maureen ChattersKatie Cyerra Yim, RD, LDN Pager #: [phone] After-Hours Pager #: [phone]

## 2014-08-13 NOTE — Progress Notes (Signed)
ANTIBIOTIC CONSULT NOTE   Pharmacy Consult for vancomycin and zosyn Indication: left AKA wound infection  Allergies  Allergen Reactions  . Oxycodone Nausea And Vomiting    Patient Measurements: Weight: 108 lb 14.5 oz (49.4 kg) (Bed) :   Vital Signs: Temp: 98.7 F (37.1 C) (12/10 1225) Temp Source: Axillary (12/10 1225) BP: 112/43 mmHg (12/10 1225) Pulse Rate: 74 (12/10 1135) Intake/Output from previous day: 12/09 0701 - 12/10 0700 In: 750 [I.V.:500; IV Piggyback:250] Out: 200 [Blood:200] Intake/Output from this shift: Total I/O In: -  Out: 773 [Other:773]  Labs:  Recent Labs  08/11/14 0048 08/11/14 1300 08/12/14 0410  WBC 12.3* 11.7* 10.9*  HGB 7.8* 7.4* 8.1*  PLT 216 212 229  CREATININE 3.81* 4.51* 2.27*   Estimated Creatinine Clearance: 21.8 mL/min (by C-G formula based on Cr of 2.27). No results for input(s): VANCOTROUGH, VANCOPEAK, VANCORANDOM, GENTTROUGH, GENTPEAK, GENTRANDOM, TOBRATROUGH, TOBRAPEAK, TOBRARND, AMIKACINPEAK, AMIKACINTROU, AMIKACIN in the last 72 hours.   Microbiology: Recent Results (from the past 720 hour(s))  Tissue culture     Status: None (Preliminary result)   Collection Time: 08/12/14  3:21 PM  Result Value Ref Range Status   Specimen Description TISSUE LEFT LEG  Final   Special Requests NONE  Final   Gram Stain   Final    FEW WBC PRESENT,BOTH PMN AND MONONUCLEAR NO ORGANISMS SEEN Performed at [facility]    Culture PENDING  Incomplete   Report Status PENDING  Incomplete    Medical History: Past Medical History  Diagnosis Date  . Hypertension   . Anemia   . Systolic CHF, acute on chronic   . Coronary artery disease   . CHF (congestive heart failure)   . Angina   . Blood transfusion ~2003; 08/12/2012    "when I started on dialysis; lower gi bleeding"  . Hypercholesteremia   . Acute lower GI bleeding 08/12/2012  . COPD (chronic obstructive pulmonary disease)   . Sickle cell trait   . Heart murmur     some say  yes , some say no  . Pneumonia 08/2012  . Peripheral vascular disease   . DVT of leg (deep venous thrombosis) 2011    "RLE"  . Type II diabetes mellitus   . Shortness of breath 01/02/12    "@rest ; lying down; w/exertion"; no change 08/12/2012  . ESRD on dialysis     [facility]" (08/10/2014)    Medications:  Scheduled:  . darbepoetin (ARANESP) injection - DIALYSIS  100 mcg Intravenous Q Tue-HD  . dextrose      . docusate sodium  100 mg Oral BID  . doxercalciferol  3 mcg Intravenous Q T,Th,Sa-HD  . enoxaparin (LOVENOX) injection  30 mg Subcutaneous Q24H  . feeding supplement (NEPRO CARB STEADY)  237 mL Oral TID WC  . insulin aspart  0-15 Units Subcutaneous TID WC  . pantoprazole  40 mg Oral Daily  . piperacillin-tazobactam (ZOSYN)  IV  2.25 g Intravenous Q8H  . vancomycin  500 mg Intravenous Q T,Th,Sa-HD  . vancomycin  1,000 mg Intravenous Once   Infusions:  . sodium chloride 10 mL/hr at 08/12/14 1212   Assessment: 53 yo female with left AKA wound infection continues on vancomycin and zosyn.  Patient has a history of ESRD on dialysis [facility] per MD's note  Goal of Therapy:  pre-HD vancomycin level 15-25 mcg/mL  Plan:  - vancomycin 1000 mg iv x1, then vancomycin 500 mg qHD - cont zosyn to 2.25 g iv  q8h - follow up HD schedule   Dava Rensch Poteet 08/13/2014,12:36 PM

## 2014-08-13 NOTE — Progress Notes (Signed)
Dr Myra GianottiBrabham notified of hemoglobin 5.3. Notified MD of inadequate IV access for CTA chest. CCM to consult.

## 2014-08-13 NOTE — Progress Notes (Signed)
[facility] [place]  Subjective  - confused, only moans as response   Objective 100/47 92 101.4 F (38.6 C) (Axillary) 22 99%  Intake/Output Summary (Last 24 hours) at 08/13/14 0829 Last data filed at 08/12/14 1546  Gross per 24 hour  Intake    750 ml  Output    200 ml  Net    550 ml   VAC well sealed left Aka  Assessment/Planning: S/p revision/debridement of aka yesterday with extensive necrotic tissue. Will change VAC tomorrow Will discuss with family today placement of feeding tube for temporary nutrition support.  May also need to consider palliative care consult to discuss goals of care   Vi Whitesel E 08/13/2014 8:29 AM --  Laboratory Lab Results:  Recent Labs  08/11/14 1300 08/12/14 0410  WBC 11.7* 10.9*  HGB 7.4* 8.1*  HCT 23.5* 27.0*  PLT 212 229   BMET  Recent Labs  08/11/14 1300 08/12/14 0410  NA 132* 137  K 4.9 4.3  CL 90* 96  CO2 29 27  GLUCOSE 269* 100*  BUN 34* 14  CREATININE 4.51* 2.27*  CALCIUM 9.6 9.8    COAG Lab Results  Component Value Date   INR 1.43 08/10/2014   INR 1.15 05/24/2010   No results found for: PTT

## 2014-08-13 NOTE — Progress Notes (Signed)
Sedated and comfortable on dialysis s/p AKA revision yesterday. Hemodynamically stable [name] C

## 2014-08-13 NOTE — Consult Note (Addendum)
WOC wound consult note Reason for Consult: VVS service following for assessment and plan of care to left amputation post-op site with Vac dressing.  Have not been consulted for this wound or requested to change dressing at this time Wound type: Consult requested for buttocks wounds.  Daughter at bedside states they were present before admission and home health has been treating them with xeroform. Pt went to the OR yesterday and site had pressure to the location for a prolonged period of time. Pressure Ulcer POA: Yes Measurement  Appearance is consistent with previous moisture associated skin damage which has peeled and evolved into several areas of full thickness skin loss.  Wound NWG:NFAOZbed:Bilat buttocks affected; all following sites red and moist with peeling skin surrounding wound edges, small amt yellow drainage, no odor.  Left buttock 1.5X7X.1cm and 4X3X.1cm Right buttock 6X1.5X.1cm Upper buttock area has evolved into an unstageable wound; 5X1.5cm 20% red, 80% tightly adhered yellow slough,  small amt yellow drainage, no odor.   Dressing procedure/placement/frequency: Santyl ointment to chemically debride nonviable tissue to upper wound.  Foam dressings to protect and promote healing to other sites.  Wounds are high risk to evolve into unstageable in other locations r/t multiple systemic factors including; immobility, Hgb 5, recent code situation today, time in the OR yesterday, incontinence.  On Sport low airloss bed to reduce pressure.  Discussed plan of care with daughter at bedside and she visualized wounds.   Cammie Mcgeeawn Jodette Wik MSN, RN, CWOCN, [facility]AP, CNS [phone]

## 2014-08-13 NOTE — Consult Note (Signed)
PULMONARY / CRITICAL CARE MEDICINE   Name: [name] MRN: [id number] DOB: 03-Jul-1961    ADMISSION DATE:  08/10/2014 CONSULTATION DATE:  08/13/14  REFERRING MD :  Myra GianottiBrabham   CHIEF COMPLAINT:  AMS, anemia  INITIAL PRESENTATION: 53yo female with multiple medical problems including DM, HTN, ESRD on HD 4x/week, end-stage access and PVD with bilat AKA (most recent was L BKA --> AKA 07/06/14).  Post op she had worsening pain and non-healing L AKA wound, admitted 12/7 and underwent surgical debridement and VAC placement 12/9.  Post op had progressive AMS, anemia, hypoglycemia and was tx to ICU.  PCCM consulted 12/10.   STUDIES:  CTA chest 12/10>>>  SIGNIFICANT EVENTS: 12/9 >>surgical debridement, VAC placement L AKA   HISTORY OF PRESENT ILLNESS: 53yo female with multiple medical problems including DM, HTN, ESRD on HD 4x/week, end-stage access and PVD with bilat AKA (most recent was L BKA --> AKA 07/06/14).  Post op she had worsening pain and non-healing L AKA wound, admitted 12/7 and underwent surgical debridement and VAC placement 12/9.  Post op had progressive AMS, anemia, hypoglycemia and was tx to ICU.  PCCM consulted 12/10.  PAST MEDICAL HISTORY :   has a past medical history of Hypertension; Anemia; Systolic CHF, acute on chronic; Coronary artery disease; CHF (congestive heart failure); Angina; Blood transfusion (~2003; 08/12/2012); Hypercholesteremia; Acute lower GI bleeding (08/12/2012); COPD (chronic obstructive pulmonary disease); Sickle cell trait; Heart murmur; Pneumonia (08/2012); Peripheral vascular disease; DVT of leg (deep venous thrombosis) (2011); Type II diabetes mellitus; Shortness of breath (01/02/12); and ESRD on dialysis.  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 Prior to Admission medications   Medication Sig Start Date End Date Taking? Authorizing Provider  BENICAR 40 MG tablet Take 40 mg by mouth daily.  01/06/13  Yes Historical Provider, MD  Bismuth Tribromoph-Petrolatum (XEROFORM PETROLAT GAUZE 5"X9") MISC Apply 1 application topically every morning. 08/06/14  Yes Sherren Kernsharles E Fields, MD  carvedilol (COREG) 25 MG tablet Take 25 mg by mouth 2 (two) times daily with a meal.   Yes Historical Provider, MD  cephALEXin (KEFLEX) 500 MG capsule Take 1 capsule (500 mg total) by mouth daily. 07/25/14  Yes Nathan R. Pickering, MD  gabapentin (NEURONTIN) 300 MG capsule Take 1 capsule (300 mg total) by mouth 2 (two) times daily. 07/29/14  Yes Suzanne L Nickel, NP  insulin glargine (LANTUS) 100 UNIT/ML injection Inject 16 Units into the skin at bedtime. 08/14/12  Yes Penny Piarlando Vega, MD  sevelamer carbonate (RENVELA) 800 MG tablet Take 800-1,600 mg by mouth See admin instructions. Take 1600mg  with meals and 800mg  with snacks   Yes Historical Provider, MD   Allergies  Allergen Reactions  . Oxycodone Nausea And Vomiting    FAMILY HISTORY:  indicated that her mother is deceased. She indicated that her father is deceased. She indicated that both of her sisters are alive.  SOCIAL HISTORY:  reports that she has never smoked. She has never used smokeless tobacco. She reports that she does not drink alcohol or use illicit drugs.  REVIEW OF SYSTEMS:  unable  SUBJECTIVE:   VITAL  SIGNS: Temp:  [97.2 F (36.2 C)-101.4 F (38.6 C)] 99.1 F (37.3 C) (12/10 1603) Pulse Rate:  [73-95] 79 (12/10 1330) Resp:  [11-35]  24 (12/10 1600) BP: (84-153)/(33-86) 135/67 mmHg (12/10 1600) SpO2:  [90 %-100 %] 98 % (12/10 1135) Weight:  [108 lb 14.5 oz (49.4 kg)-113 lb 8.6 oz (51.5 kg)] 108 lb 14.5 oz (49.4 kg) (12/10 1135) HEMODYNAMICS:   VENTILATOR SETTINGS:   INTAKE / OUTPUT:  Intake/Output Summary (Last 24 hours) at 08/13/14 1623 Last data filed at 08/13/14 1135  Gross per 24 hour  Intake      0 ml  Output    773 ml  Net   -773 ml    PHYSICAL EXAMINATION: General:  Chronically ill appearing female, acutely ill appearing  Neuro:  Obtunded, barely arousable to sternal rub, narcan ineffective, pupils sluggish  HEENT:  Mm dry, no JVD  Cardiovascular:  s1s2 rrr,  Lungs:  resps even, non labored, not protecting airway, few scattered rhonchi  Abdomen:  Soft, +bs  Musculoskeletal:  Cool, dry, bilat AKA, L aka with VAC   LABS:  CBC  Recent Labs Lab 08/11/14 1300 08/12/14 0410 08/13/14 1420  WBC 11.7* 10.9* 10.7*  HGB 7.4* 8.1* 5.3*  HCT 23.5* 27.0* 17.7*  PLT 212 229 184   Coag's  Recent Labs Lab 08/10/14 1913  INR 1.43   BMET  Recent Labs Lab 08/11/14 1300 08/12/14 0410 08/13/14 1420  NA 132* 137 138  K 4.9 4.3 3.7  CL 90* 96 94*  CO2 29 27 22   BUN 34* 14 8  CREATININE 4.51* 2.27* 1.30*  GLUCOSE 269* 100* 264*   Electrolytes  Recent Labs Lab 08/11/14 0048 08/11/14 1300 08/12/14 0410 08/13/14 1420  CALCIUM 9.5 9.6 9.8 9.1  PHOS 5.7* 6.5*  --   --    Sepsis Markers No results for input(s): LATICACIDVEN, PROCALCITON, O2SATVEN in the last 168 hours. ABG  Recent Labs Lab 08/12/14 2119 08/13/14 1300  PHART 7.351 7.492*  PCO2ART 46.7* 33.8*  PO2ART 155.0* 359.0*   Liver Enzymes  Recent Labs Lab 08/10/14 1913 08/11/14 0048 08/11/14 1300 08/13/14 1420  AST 13  --   --  453*  ALT 15  --   --  114*  ALKPHOS 95  --   --  67  BILITOT 0.3  --   --  0.6  ALBUMIN 1.7* 1.7* 1.7* 2.1*   Cardiac Enzymes No results for input(s): TROPONINI, PROBNP in the last 168  hours. Glucose  Recent Labs Lab 08/12/14 1701 08/12/14 2058 08/13/14 0615 08/13/14 1214 08/13/14 1240 08/13/14 1252  GLUCAP 140* 172* 117* 64* 65* 143*    Imaging No results found.   ASSESSMENT / PLAN:  PULMONARY Acute respiratory failure - r/t AMS, poor airway protection  P:   DNI - see discussion below  Supplemental O2 for now  No further ABG's   CARDIOVASCULAR Hx HTN  Chronic sCHF  P:  PRN hydralazine, labetalol   RENAL ESRD  P:   Renal following  F/u chem  HD per renal   GASTROINTESTINAL Severe protein calorie malnutrition  Transaminitis  P:   F/u LFT's  Hold TF for now with inability to protect airway  Check ammonia level   HEMATOLOGIC Anemia  P:  Transfuse PRBC x 2 now  Serial cbc   INFECTIOUS L AKA wound  P:   BCx2 12/10>>> Vancomycin 12/7>>> Zosyn 12/8>>> Wound care per VVS and Wound RN  ENDOCRINE DM  P:   SSI  NEUROLOGIC AMS - ?etiology.  Metabolic encephalopathy v hepatic with new onset transaminitis (although BUN normal) v sepsis v acute neurologic issue P:   Stat CT head  Check ammonia  Did not respond to narcan, if not arousable by am consider neuro input and further w/u including MRI, EEG  - await CT for now    FAMILY  - Updates:  Family updated at length at bedside.  See Dr. Percival SpanishYacoub's discussion below   Dirk DressKaty Whiteheart, NP 08/13/2014  4:23 PM Pager: [phone] or [phone]  AMS, unlikely narcotic related, patient was reversed with multiple doses of narcan without effect.  On exam, she has an extremely weak gag reflex that is concerning for lack of airway protection.  I suspect the AMS is due to sepsis as WBC is elevated with fever vs acute blood loss anemia (unlikely) vs hepatic encephalopathy (AST/ALT elevated) vs acute CVA.  Will check an ammonia level, if elevated will place on lactulose.  Will also perform a head CT to r/o stroke.  What is clear right now, the patient will not be able to protect her airway  given current status.   I had an extensive discussion with her family, all concerns relayed.  After discussion, the patient clearly has been ill for quite sometime, has multiple sacral decub, functional status is very poor and given above reasons, decision was made to make patient LCB with no CPR/cardioversion/intubation.  If able to improve with above measures then will continue manage if not then will transition to comfort care.  I tried to visualize both IJs and both SCs without much to see.  IR can not do it til tomorrow.  Vascular surgery called and will evaluate patient.   The patient is critically ill with multiple organ systems failure and requires high complexity decision making for assessment and support, frequent evaluation and titration of therapies, application of advanced monitoring technologies and extensive interpretation of multiple databases.   Critical Care Time devoted to patient care services described in this note is  60  Minutes. This time reflects time of care of this signee Dr Koren BoundWesam Vicente Weidler. This critical care time does not reflect procedure time, or teaching time or supervisory time of PA/NP/Med student/Med Resident etc but could involve care discussion time.  Alyson ReedyWesam G. Stefen Juba, M.D. [facility] Medicine. Pager: [phone]. After hours pager: [phone].

## 2014-08-13 NOTE — Progress Notes (Signed)
Spoke with pt husband.  Emphasized poor nutritional state.  He has consented to nasoenteric feeds until pt intake improves  Fabienne Brunsharles Bernhardt Riemenschneider, MD [facility] [place] Office: [phone] Pager: [phone]

## 2014-08-13 NOTE — Progress Notes (Signed)
Spouse Chartered loss adjusterWillie Nuttle called and made aware of transfer to [facility] room 07.

## 2014-08-14 ENCOUNTER — Inpatient Hospital Stay (HOSPITAL_COMMUNITY): Payer: Medicare Other

## 2014-08-14 ENCOUNTER — Encounter (HOSPITAL_COMMUNITY): Payer: Self-pay | Admitting: Vascular Surgery

## 2014-08-14 LAB — BASIC METABOLIC PANEL
ANION GAP: 18 — AB (ref 5–15)
BUN: 14 mg/dL (ref 6–23)
CHLORIDE: 97 meq/L (ref 96–112)
CO2: 25 meq/L (ref 19–32)
Calcium: 10.1 mg/dL (ref 8.4–10.5)
Creatinine, Ser: 2.24 mg/dL — ABNORMAL HIGH (ref 0.50–1.10)
GFR calc non Af Amer: 23 mL/min — ABNORMAL LOW (ref >90)
GFR, EST AFRICAN AMERICAN: 27 mL/min — AB (ref >90)
Glucose, Bld: 159 mg/dL — ABNORMAL HIGH (ref 70–99)
Potassium: 3.6 mEq/L — ABNORMAL LOW (ref 3.7–5.3)
Sodium: 140 mEq/L (ref 137–147)

## 2014-08-14 LAB — GLUCOSE, CAPILLARY
GLUCOSE-CAPILLARY: 126 mg/dL — AB (ref 70–99)
Glucose-Capillary: 150 mg/dL — ABNORMAL HIGH (ref 70–99)
Glucose-Capillary: 133 mg/dL — ABNORMAL HIGH (ref 70–99)

## 2014-08-14 LAB — TYPE AND SCREEN
ABO/RH(D): A POS
ANTIBODY SCREEN: NEGATIVE
UNIT DIVISION: 0
Unit division: 0

## 2014-08-14 LAB — AMMONIA: Ammonia: 12 umol/L (ref 11–60)

## 2014-08-14 LAB — MAGNESIUM: MAGNESIUM: 2 mg/dL (ref 1.5–2.5)

## 2014-08-14 LAB — CBC
HCT: 27.9 % — ABNORMAL LOW (ref 36.0–46.0)
Hemoglobin: 9 g/dL — ABNORMAL LOW (ref 12.0–15.0)
MCH: 27.9 pg (ref 26.0–34.0)
MCHC: 32.3 g/dL (ref 30.0–36.0)
MCV: 86.4 fL (ref 78.0–100.0)
PLATELETS: 188 10*3/uL (ref 150–400)
RBC: 3.23 MIL/uL — AB (ref 3.87–5.11)
RDW: 15.1 % (ref 11.5–15.5)
WBC: 9.4 10*3/uL (ref 4.0–10.5)

## 2014-08-14 LAB — PHOSPHORUS: Phosphorus: 5.7 mg/dL — ABNORMAL HIGH (ref 2.3–4.6)

## 2014-08-14 LAB — PREALBUMIN: PREALBUMIN: 9 mg/dL — AB (ref 17.0–34.0)

## 2014-08-14 LAB — MRSA PCR SCREENING: MRSA by PCR: POSITIVE — AB

## 2014-08-14 LAB — ALBUMIN: Albumin: 2 g/dL — ABNORMAL LOW (ref 3.5–5.2)

## 2014-08-14 MED ORDER — VITAL HIGH PROTEIN PO LIQD: 1000.0000 mL | ORAL | Status: DC

## 2014-08-14 MED ORDER — NEPRO/CARBSTEADY PO LIQD
1000.0000 mL | ORAL | Status: DC
  Administered 2014-08-14 – 2014-08-18 (×4): 1000 mL via ORAL
  Filled 2014-08-14 (×9): qty 1000

## 2014-08-14 MED ORDER — NEPRO/CARBSTEADY PO LIQD: 237.0000 mL | Freq: Three times a day (TID) | ORAL | Status: DC

## 2014-08-14 MED ORDER — MORPHINE SULFATE 2 MG/ML IJ SOLN
2.0000 mg | INTRAMUSCULAR | Status: DC | PRN
  Administered 2014-08-14 – 2014-08-17 (×18): 2 mg via INTRAVENOUS
  Filled 2014-08-14 (×18): qty 1

## 2014-08-14 MED ORDER — PANTOPRAZOLE SODIUM 40 MG IV SOLR
40.0000 mg | INTRAVENOUS | Status: DC
  Administered 2014-08-14 – 2014-08-15 (×2): 40 mg via INTRAVENOUS
  Filled 2014-08-14 (×2): qty 40

## 2014-08-14 MED ORDER — CETYLPYRIDINIUM CHLORIDE 0.05 % MT LIQD
7.0000 mL | Freq: Two times a day (BID) | OROMUCOSAL | Status: DC
  Administered 2014-08-14 (×2): 7 mL via OROMUCOSAL

## 2014-08-14 NOTE — Progress Notes (Signed)
Crying out frequently earlier this AM. Calm presently. No respiratory distress. No new issues reported by RN  Filed Vitals:   08/14/14 1100 08/14/14 1200 08/14/14 1300 08/14/14 1400  BP: 194/79 157/45 136/52 138/60  Pulse: 86 78 72 75  Temp: 97.8 F (36.6 C)     TempSrc: Oral     Resp: 14 19 22 16   Weight:      SpO2: 92% 100% 99% 99%   NAD HEENT WNL JVP not visualized Chest without adventitious sounds Reg, no M NABS, soft B AKA  I have reviewed all of today's lab results. Relevant abnormalities are discussed in the A/P section  IMPRESSION: Resp failure - resolved DM 2 Chronic s CHF - compensated Severe PVD ESRD Anemia - improved after RBCs Surgical wound infection suspected Acute delirium - seemingly improving  PLAN: Little further to add from PCCM perspective Post op care per VVS HD per Renal service Code limitation as outline per Dr Percival SpanishYacoub's note Might be reasonable to involve Palliative Care and start down a path of focusing primarily on comfort/dignity  PCCM will sign off. Please call if we can be of further assistance  Billy Fischeravid Chonte Ricke, MD ; [facility] Mobile [phone].  After 5:30 PM or weekends, call [phone]

## 2014-08-14 NOTE — Consult Note (Addendum)
WOC wound consult note Reason for Consult: Consult requested for left AKA post-op wounds.  VVS team following for assessment and plan of care; they were in earlier to remove the first post-op dressing and assess the wounds. Wound type: 2 full thickness wounds Measurement: Outer thigh 7X6X1cm with tunneling at 9:00 o'clock to 1 cm.  Beefy red with yellow adipose tissue interspersed through out the wound.  Small amt blood-tinged drainage, no odor. Inner thigh 12X10X3cm; beefy red with yellow adipose tissue interspersed through out the wound.  Small amt blood-tinged drainage, no odor. Area of sutured incision line which is beginning to dehisce to sections connects the two wounds, this is approx 5X2cm.    Wound bed: Applied 3 pieces of black foam and bridged together to 125mm cont suction.  Pt very restless and combative during procedure despite meds given earlier; screaming and thrashing around in bed. Difficult to maintain seal to Vac drape near inner groin r/t frequent moisture and movement.  Plan dressing change Monday. Cammie Mcgeeawn Paula Netherton MSN, RN, CWOCN, HissopWCN-AP, CNS [phone]

## 2014-08-14 NOTE — Progress Notes (Signed)
Assessment/Plan: 1. Non-healing L AKA -  outpatient Vancomycin & Elita QuickFortaz 11/28 per HD, now Vancomycin & Zosyn; L AKA 11/2 per Dr.Fields, debridement 12/9. 2. ESRD - HD on MTTS @ [place], but recently signing off early (2:15 today at outpatient center). HD Sat 3. 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 4. Altered mental status  Subjective: Interval History: Tx to stepdown with altered MS  Objective: Vital signs in last 24 hours: Temp:  [97.8 F (36.6 C)-99.2 F (37.3 C)] 97.8 F (36.6 C) (12/11 1100) Pulse Rate:  [77-92] 78 (12/11 1200) Resp:  [13-30] 19 (12/11 1200) BP: (80-194)/(30-86) 157/45 mmHg (12/11 1200) SpO2:  [91 %-100 %] 100 % (12/11 1200) Weight change: 2 kg (4 lb 6.6 oz)  Intake/Output from previous day: 12/10 0701 - 12/11 0700 In: 840 [Blood:690; IV Piggyback:150] Out: 773  Intake/Output this shift: Total I/O In: 50 [IV Piggyback:50] Out: -   General appearance: moderate distress Chest wall: no tenderness GI: soft, non-tender; bowel sounds normal; no masses,  no organomegaly Neurologic: Very agitated, alert, called me by my name, then appears sedated  Lab Results:  Recent Labs  08/13/14 1420 08/14/14 0255  WBC 10.7* 9.4  HGB 5.3* 9.0*  HCT 17.7* 27.9*  PLT 184 188   BMET:  Recent Labs  08/13/14 1420 08/14/14 0255  NA 138 140  K 3.7 3.6*  CL 94* 97  CO2 22 25  GLUCOSE 264* 159*  BUN 8 14  CREATININE 1.30* 2.24*  CALCIUM 9.1 10.1   No results for input(s): PTH in the last 72 hours. Iron Studies: No results for input(s): IRON, TIBC, TRANSFERRIN, FERRITIN in the last 72 hours. Studies/Results: Ct Head Wo Contrast  08/13/2014   CLINICAL DATA:  Altered mental status and desaturations since 08/12/2014 p.m.  EXAM: CT HEAD WITHOUT CONTRAST  TECHNIQUE: Contiguous axial images were obtained from the base of the skull through the vertex without intravenous contrast.  COMPARISON:  05/25/2010  FINDINGS: There is mild central and cortical atrophy.  Periventricular white matter changes are consistent with small vessel disease. There is no intra or extra-axial fluid collection or mass lesion. The basilar cisterns and ventricles have a normal appearance. There is no CT evidence for acute infarction or hemorrhage. There is marked atherosclerotic calcification of the internal carotid and vertebral arteries. Bone windows are otherwise unremarkable.  IMPRESSION: 1. Atrophy and small vessel disease. 2.  No evidence for acute  abnormality.   Electronically Signed   By: Rosalie GumsBeth  Brown M.D.   On: 08/13/2014 19:00   Ct Angio Chest Pe W/cm &/or Wo Cm  08/13/2014   CLINICAL DATA:  Altered mental status.  Oxygen desaturations.  EXAM: CT ANGIOGRAPHY CHEST WITH CONTRAST  TECHNIQUE: Multidetector CT imaging of the chest was performed using the standard protocol during bolus administration of intravenous contrast. Multiplanar CT image reconstructions and MIPs were obtained to evaluate the vascular anatomy.  CONTRAST:  100mL OMNIPAQUE IOHEXOL 350 MG/ML SOLN  COMPARISON:  Chest radiograph 08/10/2014.  FINDINGS: Bones: No aggressive osseous lesions. Lower thoracic spondylosis with anterior disc protrusion and osteophytes.  Cardiovascular: Technically adequate study without pulmonary embolism. Cardiomegaly. Coronary artery atherosclerosis is present. If office based assessment of coronary risk factors has not been performed, it is now recommended. Dense aortic branch vessel atherosclerosis.  Lungs: Atelectasis dependently.  No focal consolidation.  Central airways: Patent.  Effusions: No pleural effusions. Small amount of pericardial fluid or thickening.  Lymphadenopathy: None.  Esophagus: Normal.  Upper abdomen: Atherosclerosis.  No  acute abnormality.  Other: Chunky calcifications in the LEFT breast, nonspecific by CT.  Review of the MIP images confirms the above findings.  IMPRESSION: 1. Technically adequate study without pulmonary embolus. 2. Cardiomegaly. 3. Atherosclerosis and  coronary artery disease. 4. Small amount of pericardial fluid or thickening.   Electronically Signed   By: Andreas NewportGeoffrey  Lamke M.D.   On: 08/13/2014 19:05   Dg Chest Port 1 View  08/14/2014   CLINICAL DATA:  History of oxygen desaturations and altered mental status.  EXAM: PORTABLE CHEST - 1 VIEW  COMPARISON:  08/13/2014 and 08/10/2014  FINDINGS: Heart size is mildly enlarged. Slightly decreased lung volumes compared to prior chest examination. Mild vascular crowding in the left hilum. There is no focal airspace disease. Atherosclerotic calcifications at the aortic arch.  IMPRESSION: Mild cardiomegaly.  Slightly low lung volumes without focal airspace disease.   Electronically Signed   By: Richarda OverlieAdam  Henn M.D.   On: 08/14/2014 07:41   Scheduled: . antiseptic oral rinse  7 mL Mouth Rinse BID  . collagenase   Topical Daily  . darbepoetin (ARANESP) injection - DIALYSIS  100 mcg Intravenous Q Tue-HD  . docusate sodium  100 mg Oral BID  . doxercalciferol  3 mcg Intravenous Q T,Th,Sa-HD  . enoxaparin (LOVENOX) injection  30 mg Subcutaneous Q24H  . insulin aspart  0-15 Units Subcutaneous TID WC  . pantoprazole (PROTONIX) IV  40 mg Intravenous Q24H  . piperacillin-tazobactam (ZOSYN)  IV  2.25 g Intravenous Q8H  . vancomycin  500 mg Intravenous Q T,Th,Sa-HD  . vancomycin  1,000 mg Intravenous Once     LOS: 4 days   [name] C 08/14/2014,12:10 PM

## 2014-08-14 NOTE — Progress Notes (Signed)
[facility] [place]  Subjective  - Much more alert today, called me by name   Objective 156/41 78 98.7 F (37.1 C) (Oral) 21 100%  Intake/Output Summary (Last 24 hours) at 08/14/14 0951 Last data filed at 08/14/14 0302  Gross per 24 hour  Intake    840 ml  Output    773 ml  Net     67 ml   Left AKA wound overall clean but no real granulation yet Neuro: moves all extremities alert to person not place or time or situation, crying out frequently she wants to go home  PE CT negative for PE Chest xray negative  Head cT negative   Assessment/Planning: Delerium multifactorial hopefully improve as wound and nutrition improves S/p debridement aka continue vAC wound care consult for MWF schedule Family updated at bedside Will reinsert feeding tube for severe protein calorie malnutrition Albumin 2, poor po intake Pt can continue to take PO as well Acute blood loss anemia improved ESRD dialysis per renal   FIELDS,CHARLES E 08/14/2014 9:51 AM --  Laboratory Lab Results:  Recent Labs  08/13/14 1420 08/14/14 0255  WBC 10.7* 9.4  HGB 5.3* 9.0*  HCT 17.7* 27.9*  PLT 184 188   BMET  Recent Labs  08/13/14 1420 08/14/14 0255  NA 138 140  K 3.7 3.6*  CL 94* 97  CO2 22 25  GLUCOSE 264* 159*  BUN 8 14  CREATININE 1.30* 2.24*  CALCIUM 9.1 10.1    COAG Lab Results  Component Value Date   INR 1.43 08/10/2014   INR 1.15 05/24/2010   No results found for: PTT

## 2014-08-14 NOTE — Progress Notes (Addendum)
NUTRITION CONSULT/FOLLOW UP   INTERVENTION:  Encourage PO intake as pt is able.   Initiate Nepro formula at 15 ml/hr and increase by 10 ml every 4 hours to goal rate of 45 ml/hr   Provides 1944 kcals, 87 gm protein, 785 ml of free water  NUTRITION DIAGNOSIS: Increased nutrient needs related to ESRD on HD, wound healing as evidenced by estimated nutrition needs, ongoing  Goal: Pt to meet >/= 90% of their estimated nutrition needs, currently unmet  Monitor:  TF regimen & tolerance, PO intake, weight, labs, I/O's  ASSESSMENT: 53 y.o. female with PMH of DM, HTN, PVD, and ESRD who had right AKA in 05/2010 and left BKA in 06/2010; s/p L AKA 07/06/14; [facility] and debridement of left AKA stump incision.  Initial nutrition assessment completed 12/9.  Pt s/p procedure 12/9: REVISION LEFT ABOVE KNEE AMPUTATION  VAC PLACEMENT  [facility] 2[facility]-Cardiac after unresponsive event (glucose of 64). Per MD notes family discussion 12/10, no intubation planned but continuing medical treatment for now.  Potassium low, Phosphorus elevated.   Height: Ht Readings from Last 1 Encounters:  08/10/14 5\' 4"  (1.626 m)    Weight: Wt Readings from Last 1 Encounters:  08/13/14 108 lb 14.5 oz (49.4 kg)    BMI:  25.2 kg/m2 -- adjusted for amputations  Estimated Nutritional Needs: Kcal: 1800-2000 Protein: 90-100 gm Fluid: 1200 ml  Skin: L AKA stump incision   Diet Order: Diet renal/carb modified with 1200 ml fluid restriction Meal Completion: 0%   Intake/Output Summary (Last 24 hours) at 08/14/14 1111 Last data filed at 08/14/14 [id number]  Gross per 24 hour  Intake    890 ml  Output    773 ml  Net    117 ml    Labs:   Recent Labs Lab 08/11/14 0048 08/11/14 1300 08/12/14 0410 08/13/14 1420 08/14/14 0255  NA 132* 132* 137 138 140  K 4.3 4.9 4.3 3.7 3.6*  CL 89* 90* 96 94* 97  CO2 28 29 27 22 25   BUN 28* 34* 14 8 14   CREATININE 3.81* 4.51*  2.27* 1.30* 2.24*  CALCIUM 9.5 9.6 9.8 9.1 10.1  MG  --   --   --   --  2.0  PHOS 5.7* 6.5*  --   --  5.7*  GLUCOSE 330* 269* 100* 264* 159*    CBG (last 3)   Recent Labs  08/13/14 1601 08/13/14 2112 08/14/14 0730  GLUCAP 226* 182* 150*    Scheduled Meds: . antiseptic oral rinse  7 mL Mouth Rinse BID  . collagenase   Topical Daily  . darbepoetin (ARANESP) injection - DIALYSIS  100 mcg Intravenous Q Tue-HD  . docusate sodium  100 mg Oral BID  . doxercalciferol  3 mcg Intravenous Q T,Th,Sa-HD  . enoxaparin (LOVENOX) injection  30 mg Subcutaneous Q24H  . feeding supplement (NEPRO CARB STEADY)  237 mL Oral TID WC  . insulin aspart  0-15 Units Subcutaneous TID WC  . pantoprazole (PROTONIX) IV  40 mg Intravenous Q24H  . piperacillin-tazobactam (ZOSYN)  IV  2.25 g Intravenous Q8H  . vancomycin  500 mg Intravenous Q T,Th,Sa-HD  . vancomycin  1,000 mg Intravenous Once    Continuous Infusions: . sodium chloride 10 mL/hr at 08/12/14 [address]   [name] RD, LDN, CNSC [phone] Pager [phone] After Hours Pager

## 2014-08-14 NOTE — Progress Notes (Signed)
UR Completed.  [phone]  

## 2014-08-15 ENCOUNTER — Inpatient Hospital Stay (HOSPITAL_COMMUNITY): Payer: Medicare Other

## 2014-08-15 LAB — RENAL FUNCTION PANEL
ALBUMIN: 1.7 g/dL — AB (ref 3.5–5.2)
Anion gap: 16 — ABNORMAL HIGH (ref 5–15)
BUN: 26 mg/dL — ABNORMAL HIGH (ref 6–23)
CHLORIDE: 94 meq/L — AB (ref 96–112)
CO2: 25 mEq/L (ref 19–32)
CREATININE: 3.86 mg/dL — AB (ref 0.50–1.10)
Calcium: 9.3 mg/dL (ref 8.4–10.5)
GFR calc Af Amer: 14 mL/min — ABNORMAL LOW (ref >90)
GFR calc non Af Amer: 12 mL/min — ABNORMAL LOW (ref >90)
Glucose, Bld: 154 mg/dL — ABNORMAL HIGH (ref 70–99)
POTASSIUM: 3.8 meq/L (ref 3.7–5.3)
Phosphorus: 6.1 mg/dL — ABNORMAL HIGH (ref 2.3–4.6)
Sodium: 135 mEq/L — ABNORMAL LOW (ref 137–147)

## 2014-08-15 LAB — GLUCOSE, CAPILLARY
GLUCOSE-CAPILLARY: 111 mg/dL — AB (ref 70–99)
GLUCOSE-CAPILLARY: 130 mg/dL — AB (ref 70–99)
GLUCOSE-CAPILLARY: 216 mg/dL — AB (ref 70–99)
Glucose-Capillary: 220 mg/dL — ABNORMAL HIGH (ref 70–99)

## 2014-08-15 MED ORDER — ACETAMINOPHEN 325 MG PO TABS
ORAL_TABLET | ORAL | Status: AC
  Filled 2014-08-15: qty 2

## 2014-08-15 MED ORDER — DOXERCALCIFEROL 4 MCG/2ML IV SOLN
INTRAVENOUS | Status: AC
  Filled 2014-08-15: qty 2

## 2014-08-15 MED ORDER — CETYLPYRIDINIUM CHLORIDE 0.05 % MT LIQD: 7.0000 mL | Freq: Two times a day (BID) | OROMUCOSAL | Status: DC

## 2014-08-15 MED ORDER — CHLORHEXIDINE GLUCONATE 0.12 % MT SOLN
15.0000 mL | Freq: Two times a day (BID) | OROMUCOSAL | Status: DC
  Administered 2014-08-15 – 2014-08-18 (×7): 15 mL via OROMUCOSAL
  Filled 2014-08-15 (×10): qty 15

## 2014-08-15 MED ORDER — CETYLPYRIDINIUM CHLORIDE 0.05 % MT LIQD
7.0000 mL | Freq: Two times a day (BID) | OROMUCOSAL | Status: DC
  Administered 2014-08-15 – 2014-08-18 (×7): 7 mL via OROMUCOSAL

## 2014-08-15 NOTE — Progress Notes (Signed)
Patient removed NG feeding tube. MD notified. Tube feeds held at this time.

## 2014-08-15 NOTE — Progress Notes (Signed)
   VASCULAR SURGERY ASSESSMENT & PLAN:  * 3 Days Post-Op s/p: revision left AKA  * Continue VAC  * Continue nutritional support with tube feeds.   * For HD today.  SUBJECTIVE: No change in mental status.  PHYSICAL EXAM: Filed Vitals:   08/15/14 0334 08/15/14 0338 08/15/14 0400 08/15/14 0500  BP: 132/60  134/51 146/60  Pulse:      Temp:      TempSrc:      Resp: 17  14 22   Weight:  113 lb 12.1 oz (51.6 kg)    SpO2:       VAC with good seal.   LABS: Lab Results  Component Value Date   WBC 9.4 08/14/2014   HGB 9.0* 08/14/2014   HCT 27.9* 08/14/2014   MCV 86.4 08/14/2014   PLT 188 08/14/2014   Lab Results  Component Value Date   CREATININE 2.24* 08/14/2014   Lab Results  Component Value Date   INR 1.43 08/10/2014   CBG (last 3)   Recent Labs  08/14/14 1819 08/14/14 2149 08/15/14 0336  GLUCAP 133* 111* 130*    Active Problems:   Non-healing amputation site   Acute-on-chronic respiratory failure   Sepsis   Acute encephalopathy   Cari CarawayChris Dickson Beeper: [phone] 08/15/2014

## 2014-08-15 NOTE — Procedures (Signed)
Tolerating HD today. Very lethargic but arouses.  No shouting episodes witnessed by me. [name] C

## 2014-08-16 LAB — GLUCOSE, CAPILLARY
GLUCOSE-CAPILLARY: 229 mg/dL — AB (ref 70–99)
Glucose-Capillary: 253 mg/dL — ABNORMAL HIGH (ref 70–99)
Glucose-Capillary: 312 mg/dL — ABNORMAL HIGH (ref 70–99)
Glucose-Capillary: 162 mg/dL — ABNORMAL HIGH (ref 70–99)
Glucose-Capillary: 182 mg/dL — ABNORMAL HIGH (ref 70–99)
Glucose-Capillary: 172 mg/dL — ABNORMAL HIGH (ref 70–99)
Glucose-Capillary: 190 mg/dL — ABNORMAL HIGH (ref 70–99)

## 2014-08-16 LAB — TISSUE CULTURE

## 2014-08-16 LAB — CLOSTRIDIUM DIFFICILE BY PCR: CDIFFPCR: NEGATIVE

## 2014-08-16 MED ORDER — NEPRO/CARBSTEADY PO LIQD
237.0000 mL | ORAL | Status: DC | PRN
  Filled 2014-08-16: qty 237

## 2014-08-16 MED ORDER — HEPARIN SODIUM (PORCINE) 1000 UNIT/ML DIALYSIS
20.0000 [IU]/kg | INTRAMUSCULAR | Status: DC | PRN
  Filled 2014-08-16: qty 1

## 2014-08-16 MED ORDER — SODIUM CHLORIDE 0.9 % IV SOLN: 100.0000 mL | INTRAVENOUS | Status: DC | PRN

## 2014-08-16 MED ORDER — LIDOCAINE-PRILOCAINE 2.5-2.5 % EX CREA
1.0000 "application " | TOPICAL_CREAM | CUTANEOUS | Status: DC | PRN
  Filled 2014-08-16: qty 5

## 2014-08-16 MED ORDER — HEPARIN SODIUM (PORCINE) 1000 UNIT/ML DIALYSIS
1000.0000 [IU] | INTRAMUSCULAR | Status: DC | PRN
  Filled 2014-08-16: qty 1

## 2014-08-16 MED ORDER — PENTAFLUOROPROP-TETRAFLUOROETH EX AERO: 1.0000 "application " | INHALATION_SPRAY | CUTANEOUS | Status: DC | PRN

## 2014-08-16 MED ORDER — ALTEPLASE 2 MG IJ SOLR
2.0000 mg | Freq: Once | INTRAMUSCULAR | Status: AC | PRN
  Filled 2014-08-16: qty 2

## 2014-08-16 MED ORDER — PANTOPRAZOLE SODIUM 40 MG PO PACK
40.0000 mg | PACK | Freq: Every day | ORAL | Status: DC
  Administered 2014-08-16 – 2014-08-18 (×3): 40 mg
  Filled 2014-08-16 (×4): qty 20

## 2014-08-16 MED ORDER — LIDOCAINE HCL (PF) 1 % IJ SOLN: 5.0000 mL | INTRAMUSCULAR | Status: DC | PRN

## 2014-08-16 NOTE — Progress Notes (Signed)
Drsgs to bilat buttocks and inner thighs changed after ea of the 3 , soft, brown stools this shift .wounds first cleansed w/NS , then  sangyl ung applied to buttocks ,then damp gauze applied to buttocks , then mepilex applied to all wound sites.

## 2014-08-16 NOTE — Progress Notes (Signed)
Assessment/Plan: 1. 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 Wound vac 2. ESRD - HD on MTTS @ [place], I think we can switch to 3 days per week while in the hospital and do MWF 3. 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 4. Altered mental status  Subjective: Interval History:  Sedated  Objective: Vital signs in last 24 hours: Temp:  [97.9 F (36.6 C)-100.9 F (38.3 C)] 100.1 F (37.8 C) (12/13 0730) Pulse Rate:  [72-84] 79 (12/13 0900) Resp:  [7-23] 23 (12/13 0900) BP: (113-166)/(48-79) 153/64 mmHg (12/13 0900) SpO2:  [90 %-100 %] 100 % (12/13 0900) Weight:  [49.8 kg (109 lb 12.6 oz)-49.9 kg (110 lb 0.2 oz)] 49.8 kg (109 lb 12.6 oz) (12/13 0358) Weight change: -0.2 kg (-7.1 oz)  Intake/Output from previous day: 12/12 0701 - 12/13 0700 In: 1045 [P.O.:120; NG/GT:675; IV Piggyback:250] Out: 1501 [Stool:1] Intake/Output this shift:    General appearance: very lethargic, eyes closed but arouses bilat amputee  Lab Results:  Recent Labs  08/13/14 1420 08/14/14 0255  WBC 10.7* 9.4  HGB 5.3* 9.0*  HCT 17.7* 27.9*  PLT 184 188   BMET:  Recent Labs  08/14/14 0255 08/15/14 0804  NA 140 135*  K 3.6* 3.8  CL 97 94*  CO2 25 25  GLUCOSE 159* 154*  BUN 14 26*  CREATININE 2.24* 3.86*  CALCIUM 10.1 9.3   No results for input(s): PTH in the last 72 hours. Iron Studies: No results for input(s): IRON, TIBC, TRANSFERRIN, FERRITIN in the last 72 hours. Studies/Results: Dg Abd Portable 1v  08/15/2014   CLINICAL DATA:  Nasogastric tube placement.  EXAM: PORTABLE ABDOMEN - 1 VIEW  COMPARISON:  08/14/2014  FINDINGS: Enteric tube tip localizes over the upper mid abdomen consistent with location in the lower stomach. Nonobstructive bowel gas pattern. Calcifications demonstrated in the upper abdomen, likely vascular. Pancreatic calcification not excluded. Breast calcifications incidentally noted.  IMPRESSION: Enteric tube tip localizes over the lower stomach.    Electronically Signed   By: Burman NievesWilliam  Stevens M.D.   On: 08/15/2014 23:15   Dg Abd Portable 1v  08/14/2014   CLINICAL DATA:  53 year old female with attempted transpyloric feeding tube placement  EXAM: PORTABLE ABDOMEN - 1 VIEW  COMPARISON:  Prior CT abdomen/ pelvis 08/27/2012  FINDINGS: Interval placement of a weighted tip enteric feeding tube. The tip is in the region of the pre-pyloric gastric antrum. The bowel gas pattern is not obstructed. Cardiomegaly with left heart enlargement. Atherosclerotic calcifications noted throughout the aorta. No acute osseous abnormality.  IMPRESSION: 1. The weighted tip of the enteric feeding tube overlies in the pre-pyloric gastric antrum.   Electronically Signed   By: Malachy MoanHeath  McCullough M.D.   On: 08/14/2014 13:50   Scheduled: . antiseptic oral rinse  7 mL Mouth Rinse q12n4p  . chlorhexidine  15 mL Mouth Rinse BID  . collagenase   Topical Daily  . darbepoetin (ARANESP) injection - DIALYSIS  100 mcg Intravenous Q Tue-HD  . docusate sodium  100 mg Oral BID  . doxercalciferol  3 mcg Intravenous Q T,Th,Sa-HD  . enoxaparin (LOVENOX) injection  30 mg Subcutaneous Q24H  . insulin aspart  0-15 Units Subcutaneous TID WC  . pantoprazole sodium  40 mg Per Tube Daily  . piperacillin-tazobactam (ZOSYN)  IV  2.25 g Intravenous Q8H  . vancomycin  500 mg Intravenous Q T,Th,Sa-HD  . vancomycin  1,000 mg Intravenous Once     LOS: 6 days  Evamaria Detore C 08/16/2014,10:42 AM

## 2014-08-16 NOTE — Progress Notes (Signed)
   VASCULAR SURGERY ASSESSMENT & PLAN:  * 4 Days Post-Op s/p: revision of left above-the-knee amputation  *  For VAC change tomorrow  * DIARRHEA: Her C. Difficile is pending. I will hold her tube feeds for 4 hours because of her diarrhea.  * END-STAGE RENAL DISEASE: Dialysis on Tuesdays Thursdays and Saturdays.  SUBJECTIVE: No change in mental status.  PHYSICAL EXAM: Filed Vitals:   08/16/14 0500 08/16/14 0600 08/16/14 0700 08/16/14 0730  BP: 154/49 155/48 155/53   Pulse: 84  83 83  Temp:    100.1 F (37.8 C)  TempSrc:    Axillary  Resp: 12 23 19 14   Weight:      SpO2: 99%  100% 100%   VAC with good seal.  LABS: Lab Results  Component Value Date   WBC 9.4 08/14/2014   HGB 9.0* 08/14/2014   HCT 27.9* 08/14/2014   MCV 86.4 08/14/2014   PLT 188 08/14/2014   Lab Results  Component Value Date   CREATININE 3.86* 08/15/2014   Lab Results  Component Value Date   INR 1.43 08/10/2014   CBG (last 3)   Recent Labs  08/16/14 0024 08/16/14 0351 08/16/14 0727  GLUCAP 253* 229* 312*    Active Problems:   Non-healing amputation site   Acute-on-chronic respiratory failure   Sepsis   Acute encephalopathy   Cari CarawayChris Aleea Hendry Beeper: [phone] 08/16/2014

## 2014-08-17 LAB — GLUCOSE, CAPILLARY
GLUCOSE-CAPILLARY: 282 mg/dL — AB (ref 70–99)
Glucose-Capillary: 221 mg/dL — ABNORMAL HIGH (ref 70–99)
Glucose-Capillary: 214 mg/dL — ABNORMAL HIGH (ref 70–99)
Glucose-Capillary: 429 mg/dL — ABNORMAL HIGH (ref 70–99)

## 2014-08-17 LAB — CREATININE, SERUM
Creatinine, Ser: 3.96 mg/dL — ABNORMAL HIGH (ref 0.50–1.10)
GFR calc Af Amer: 14 mL/min — ABNORMAL LOW (ref >90)
GFR calc non Af Amer: 12 mL/min — ABNORMAL LOW (ref >90)

## 2014-08-17 MED ORDER — VANCOMYCIN HCL 500 MG IV SOLR
500.0000 mg | Freq: Once | INTRAVENOUS | Status: AC
  Administered 2014-08-17: 500 mg via INTRAVENOUS
  Filled 2014-08-17: qty 500

## 2014-08-17 MED ORDER — HEPARIN SODIUM (PORCINE) 1000 UNIT/ML DIALYSIS
1000.0000 [IU] | INTRAMUSCULAR | Status: DC | PRN
  Filled 2014-08-17: qty 1

## 2014-08-17 MED ORDER — HALOPERIDOL LACTATE 5 MG/ML IJ SOLN
5.0000 mg | Freq: Once | INTRAMUSCULAR | Status: AC
  Administered 2014-08-17: 5 mg via INTRAVENOUS
  Filled 2014-08-17: qty 1

## 2014-08-17 MED ORDER — VANCOMYCIN HCL 500 MG IV SOLR
500.0000 mg | INTRAVENOUS | Status: DC
  Filled 2014-08-17: qty 500

## 2014-08-17 MED ORDER — NEPRO/CARBSTEADY PO LIQD
237.0000 mL | ORAL | Status: DC | PRN
  Filled 2014-08-17: qty 237

## 2014-08-17 MED ORDER — PRO-STAT SUGAR FREE PO LIQD
30.0000 mL | Freq: Every day | ORAL | Status: DC
  Administered 2014-08-18: 30 mL
  Filled 2014-08-17 (×3): qty 30

## 2014-08-17 MED ORDER — ALTEPLASE 2 MG IJ SOLR
2.0000 mg | Freq: Once | INTRAMUSCULAR | Status: AC | PRN
  Filled 2014-08-17: qty 2

## 2014-08-17 MED ORDER — LIDOCAINE HCL (PF) 1 % IJ SOLN: 5.0000 mL | INTRAMUSCULAR | Status: DC | PRN

## 2014-08-17 MED ORDER — PENTAFLUOROPROP-TETRAFLUOROETH EX AERO: 1.0000 "application " | INHALATION_SPRAY | CUTANEOUS | Status: DC | PRN

## 2014-08-17 MED ORDER — DARBEPOETIN ALFA 100 MCG/0.5ML IJ SOSY
100.0000 ug | PREFILLED_SYRINGE | INTRAMUSCULAR | Status: DC
Start: 2014-08-19 — End: 2014-08-19
  Filled 2014-08-17: qty 0.5

## 2014-08-17 MED ORDER — LIDOCAINE-PRILOCAINE 2.5-2.5 % EX CREA
1.0000 "application " | TOPICAL_CREAM | CUTANEOUS | Status: DC | PRN
  Filled 2014-08-17: qty 5

## 2014-08-17 MED ORDER — SODIUM CHLORIDE 0.9 % IV SOLN: 100.0000 mL | INTRAVENOUS | Status: DC | PRN

## 2014-08-17 MED ORDER — HEPARIN SODIUM (PORCINE) 1000 UNIT/ML DIALYSIS
20.0000 [IU]/kg | INTRAMUSCULAR | Status: DC | PRN
  Filled 2014-08-17: qty 1

## 2014-08-17 NOTE — Progress Notes (Addendum)
   [facility] [place]  Subjective  - Patient more alert today than I saw her 3 days ago.  Left stump wound vac in place.  She screams out in pain when the left leg is approached.    Objective 154/67 76 99.9 F (37.7 C) (Axillary) 17 100%  Intake/Output Summary (Last 24 hours) at 08/17/14 1002 Last data filed at 08/17/14 0600  Gross per 24 hour  Intake    310 ml  Output      0 ml  Net    310 ml    Left AKA wound vac in place and sealed.  Assessment/Planning: POD # 6 Revision left AKA, non healing AKA Wound vac will be changed today, currently in dialysis Respiratory failure is resolving per CCM CCM signed off 08/14/2014 Tm 101, C diff negative with draw new labs in the am    [name], [name] [facility] Center[facility]EN 08/17/2014 10:02 AM --  Laboratory Lab Results: No results for input(s): WBC, HGB, HCT, PLT in the last 72 hours. BMET  Recent Labs  08/15/14 0804 08/17/14 0525  NA 135*  --   K 3.8  --   CL 94*  --   CO2 25  --   GLUCOSE 154*  --   BUN 26*  --   CREATININE 3.86* 3.96*  CALCIUM 9.3  --     COAG Lab Results  Component Value Date   INR 1.43 08/10/2014   INR 1.15 05/24/2010   No results found for: PTT

## 2014-08-17 NOTE — Progress Notes (Signed)
Subjective:  Seen on dialysis, drowsy, but no complaints  Objective: Vital signs in last 24 hours: Temp:  [99.1 F (37.3 C)-101.1 F (38.4 C)] 99.9 F (37.7 C) (12/14 0645) Pulse Rate:  [70-82] 77 (12/14 0830) Resp:  [13-24] 13 (12/14 0830) BP: (130-184)/(49-83) 168/70 mmHg (12/14 0830) SpO2:  [96 %-100 %] 100 % (12/14 0645) Weight:  [49.8 kg (109 lb 12.6 oz)-50.3 kg (110 lb 14.3 oz)] 49.8 kg (109 lb 12.6 oz) (12/14 0645) Weight change: -1.1 kg (-2 lb 6.8 oz)  Intake/Output from previous day: 12/13 0701 - 12/14 0700 In: 390 [I.V.:190; NG/GT:100; IV Piggyback:100] Out: -  Intake/Output this shift:   EXAM: Gen: Lethargic, but able to arouse, in no apparent distress Resp:  CTA without rales, rhonchi, or wheezes Cardio:  RRR without murmur or rbu GI: + BS, soft and nontender Ext:  Bilateral AKAs, dressing on L Access:  AVG @ LFA with BFR 400  Lab Results: No results for input(s): WBC, HGB, HCT, PLT in the last 72 hours. BMET:  Recent Labs  08/15/14 0804 08/17/14 0525  NA 135*  --   K 3.8  --   CL 94*  --   CO2 25  --   GLUCOSE 154*  --   BUN 26*  --   CREATININE 3.86* 3.96*  CALCIUM 9.3  --   ALBUMIN 1.7*  --    No results for input(s): PTH in the last 72 hours. Iron Studies: No results for input(s): IRON, TIBC, TRANSFERRIN, FERRITIN in the last 72 hours.  Studies/Results: No results found.   Dialysis Orders: [facility] 4 hrs 51.5 kg 2K/2Ca 400/A1.5 Heparin 3000 U AVG @ LFA No Hectorol No Aranesp or Venofer  Assessment/Plan: 1. Non-healing L AKA - s/p L AKA 11/2, revision with vac placement 12/9 per Dr. Darrick PennaFields;, started outpatient antibiotics 11/28, Vancomycin & Zosyn @ [facility]. 2. Diarrhea - C diff negative. 3. ESRD - HD on MTTS @ [facility], MWF @ [facility], K 3.8. HD today 4. HTN/volume - BP 166/74, outpatient Benicar 40 mg qd, Carvedilol 25 mg bid; wt 49.8 kg, below EDW.  5. Anemia - Hgb 9, T-sat 32%, on Aranesp 100 mcg on  Wed. 6. Metabolic bone disease - Ca 9.3 (11.1 corrected), P 6.1, iPTH 1061; Hectorol 3 mcg, Renvela 2 with meals. 7. Nutrition - Alb down to 1.7, carb-mod renal diet. 8. DM Type 2 - per primary. 9. PVD - s/p R AKA 05/2010, L AKA 11/2    LOS: 7 days   LYLES,CHARLES 08/17/2014,9:03 AM  Pt seen, examined and agree w A/P as above.  Vinson Moselleob Laurene Melendrez MD pager [phone]    cell [phone] 08/17/2014, 9:46 AM

## 2014-08-17 NOTE — Care Management Note (Signed)
Patient is active with [facility] for [facility].  Resumption of care orders requested upon discharge. °

## 2014-08-17 NOTE — Progress Notes (Signed)
NUTRITION CONSULT/FOLLOW UP   INTERVENTION:  Encourage PO intake as pt is able.   Continue Nepro with Carb Steady at 45 ml/hr   Add 30 ml Prostat daily  Provides 2044 kcals, 102 gm protein, 785 ml of free water  NUTRITION DIAGNOSIS: Increased nutrient needs related to ESRD on HD, wound healing as evidenced by estimated nutrition needs, ongoing  Goal: Pt to meet >/= 90% of their estimated nutrition needs, met.   Monitor:  TF regimen & tolerance, PO intake, weight, labs, I/O's  ASSESSMENT: 53 y.o. female with PMH of DM, HTN, PVD, and ESRD who had right AKA in 05/2010 and left BKA in 06/2010; s/p L AKA 07/06/14; admitted to [facility] for IV ABX and debridement of left AKA stump incision.  Pt s/p procedure 12/9: REVISION LEFT ABOVE KNEE AMPUTATION  VAC PLACEMENT  Pt transferred to [facility] from 2W-Cardiac after unresponsive event (glucose of 64). Per MD notes family discussion 12/10, no intubation planned but continuing medical treatment for now.  BUN, SCr, and Phosphorus elevated.  CBG's: 214-221 WOC RN in to check on wound. Pt with unstageable pressure ulcer on buttocks. Pt having diarrhea which is c.diff negative.  NG in with tip of tube in lower stomach.   Height: Ht Readings from Last 1 Encounters:  08/10/14 _0  (1.626 m)    Weight: Wt Readings from Last 1 Encounters:  08/17/14 105 lb 9.6 oz (47.9 kg)  Admission weight: 123 bl (55.8 kg) 12/7 AKA 12/9, weight range 108-113 lb   BMI:  25.2 kg/m2 -- adjusted for amputations  Estimated Nutritional Needs: Kcal: 1800-2000 Protein: 90-100 gm Fluid: 1200 ml  Skin:  L AKA stump incision  Unstageable pressure ulcer to buttocks  Diet Order: Diet renal/carb modified with 1200 ml fluid restriction Meal Completion: 0%   Intake/Output Summary (Last 24 hours) at 08/17/14 1129 Last data filed at 08/17/14 1010  Gross per 24 hour  Intake    300 ml  Output   1917 ml  Net  -1617 ml    Labs:   Recent Labs Lab  08/11/14 1300  08/13/14 1420 08/14/14 0255 08/15/14 0804 08/17/14 0525  NA 132*  < > 138 140 135*  --   K 4.9  < > 3.7 3.6* 3.8  --   CL 90*  < > 94* 97 94*  --   CO2 29  < > _1 --   BUN 34*  < > 8 14 26*  --   CREATININE 4.51*  < > 1.30* 2.24* 3.86* 3.96*  CALCIUM 9.6  < > 9.1 10.1 9.3  --   MG  --   --   --  2.0  --   --   PHOS 6.5*  --   --  5.7* 6.1*  --   GLUCOSE 269*  < > 264* 159* 154*  --   < > = values in this interval not displayed.  CBG (last 3)   Recent Labs  08/16/14 2326 08/17/14 0452 08/17/14 1045  GLUCAP 190* 221* 214*    Scheduled Meds: . antiseptic oral rinse  7 mL Mouth Rinse q12n4p  . chlorhexidine  15 mL Mouth Rinse BID  . collagenase   Topical Daily  . [START ON 08/19/2014] darbepoetin (ARANESP) injection - DIALYSIS  100 mcg Intravenous Q Wed-HD  . docusate sodium  100 mg Oral BID  . enoxaparin (LOVENOX) injection  30 mg Subcutaneous Q24H  . insulin aspart  0-15 Units Subcutaneous TID WC  .  pantoprazole sodium  40 mg Per Tube Daily  . piperacillin-tazobactam (ZOSYN)  IV  2.25 g Intravenous Q8H  . vancomycin  500 mg Intravenous Q T,Th,Sa-HD  . vancomycin  1,000 mg Intravenous Once    Continuous Infusions: . sodium chloride 10 mL/hr at 08/14/14 2000  . feeding supplement (NEPRO CARB STEADY) 1,000 mL (08/17/14 1127)   [place], [place], [place] Pager [phone] After Hours Pager

## 2014-08-17 NOTE — Procedures (Signed)
I was present at this dialysis session, have reviewed the session itself and made  appropriate changes  Vinson Moselleob Shereka Lafortune MD (pgr) [phone]    ([phone]) [phone] 08/17/2014, 9:45 AM

## 2014-08-17 NOTE — Progress Notes (Signed)
   VASCULAR SURGERY ASSESSMENT & PLAN:  * 5 Days Post-Op s/p: Revision of left above-the-knee amputation.  *  For VAC change today.  * C. Difficile was negative.  SUBJECTIVE: currently on hemodialysis. No change in mental status.  PHYSICAL EXAM: Filed Vitals:   08/17/14 0730 08/17/14 0800 08/17/14 0830 08/17/14 0900  BP:  184/83 168/70 166/74  Pulse: 70 75 77 74  Temp:      TempSrc:      Resp: 16 15 13 14   Weight:      SpO2:       VAC with good seal.  LABS: Lab Results  Component Value Date   WBC 9.4 08/14/2014   HGB 9.0* 08/14/2014   HCT 27.9* 08/14/2014   MCV 86.4 08/14/2014   PLT 188 08/14/2014   Lab Results  Component Value Date   CREATININE 3.96* 08/17/2014   Lab Results  Component Value Date   INR 1.43 08/10/2014   CBG (last 3)   Recent Labs  08/16/14 2034 08/16/14 2326 08/17/14 0452  GLUCAP 172* 190* 221*    Active Problems:   Non-healing amputation site   Acute-on-chronic respiratory failure   Sepsis   Acute encephalopathy   Cari CarawayChris Dickson Beeper: [phone] 08/17/2014

## 2014-08-17 NOTE — Progress Notes (Signed)
UR Completed.  [phone]  

## 2014-08-17 NOTE — Consult Note (Addendum)
WOC follow-up: Left stump dressing was changed last night according to the bedside nurse, R/T soiling from stool.  Currently intact with good seal and 125mm cont suction, mod amt tan drainage. Plan dressing change on Wed. Cammie Mcgeeawn Derreon Consalvo MSN, RN, CWOCN, [place], CNS [phone]

## 2014-08-17 NOTE — Progress Notes (Signed)
ANTIBIOTIC CONSULT NOTE   Pharmacy Consult for vancomycin and zosyn Indication: left AKA wound infection  Allergies  Allergen Reactions  . Oxycodone Nausea And Vomiting    Patient Measurements: Weight: 105 lb 9.6 oz (47.9 kg) (Bed)  Vital Signs: Temp: 98.2 F (36.8 C) (12/14 1116) Temp Source: Axillary (12/14 1116) BP: 131/83 mmHg (12/14 1116) Pulse Rate: 74 (12/14 1116) Intake/Output from previous day: 12/13 0701 - 12/14 0700 In: 390 [I.V.:190; NG/GT:100; IV Piggyback:100] Out: -  Intake/Output from this shift: Total I/O In: 90 [NG/GT:90] Out: 1917 [Other:1917]  Labs:  Recent Labs  08/15/14 0804 08/17/14 0525  CREATININE 3.86* 3.96*   Estimated Creatinine Clearance: 12.1 mL/min (by C-G formula based on Cr of 3.96). No results for input(s): VANCOTROUGH, VANCOPEAK, VANCORANDOM, GENTTROUGH, GENTPEAK, GENTRANDOM, TOBRATROUGH, TOBRAPEAK, TOBRARND, AMIKACINPEAK, AMIKACINTROU, AMIKACIN in the last 72 hours.   Microbiology: Recent Results (from the past 720 hour(s))  Tissue culture     Status: None   Collection Time: 08/12/14  3:21 PM  Result Value Ref Range Status   Specimen Description TISSUE LEFT LEG  Final   Special Requests NONE  Final   Gram Stain   Final    FEW WBC PRESENT,BOTH PMN AND MONONUCLEAR NO ORGANISMS SEEN Performed at [facility]    Culture   Final    FEW METHICILLIN RESISTANT STAPHYLOCOCCUS AUREUS Note: RIFAMPIN AND GENTAMICIN SHOULD NOT BE USED AS SINGLE DRUGS FOR TREATMENT OF STAPH INFECTIONS. This organism DOES NOT demonstrate inducible Clindamycin resistance in vitro. Performed at [facility]    Report Status 08/16/2014 FINAL  Final   Organism ID, Bacteria METHICILLIN RESISTANT STAPHYLOCOCCUS AUREUS  Final      Susceptibility   Methicillin resistant staphylococcus aureus - MIC*    CLINDAMYCIN <=0.25 SENSITIVE Sensitive     ERYTHROMYCIN >=8 RESISTANT Resistant     GENTAMICIN <=0.5 SENSITIVE Sensitive     LEVOFLOXACIN >=8  RESISTANT Resistant     OXACILLIN >=4 RESISTANT Resistant     PENICILLIN >=0.5 RESISTANT Resistant     RIFAMPIN <=0.5 SENSITIVE Sensitive     TRIMETH/SULFA <=10 SENSITIVE Sensitive     VANCOMYCIN 1 SENSITIVE Sensitive     TETRACYCLINE 2 SENSITIVE Sensitive     * FEW METHICILLIN RESISTANT STAPHYLOCOCCUS AUREUS  MRSA PCR Screening     Status: Abnormal   Collection Time: 08/14/14  5:19 AM  Result Value Ref Range Status   MRSA by PCR POSITIVE (A) NEGATIVE Final    Comment:        The GeneXpert MRSA Assay (FDA approved for NASAL specimens only), is one component of a comprehensive MRSA colonization surveillance program. It is not intended to diagnose MRSA infection nor to guide or monitor treatment for MRSA infections. CRITICAL RESULT CALLED TO, READ BACK BY AND VERIFIED WITH: Royetta AsalWILSON L RN 08/14/14 0850 COSTELLO B   Clostridium Difficile by PCR     Status: None   Collection Time: 08/15/14 10:46 PM  Result Value Ref Range Status   C difficile by pcr NEGATIVE NEGATIVE Final    Medical History: Past Medical History  Diagnosis Date  . Hypertension   . Anemia   . Systolic CHF, acute on chronic   . Coronary artery disease   . CHF (congestive heart failure)   . Angina   . Blood transfusion ~2003; 08/12/2012    "when I started on dialysis; lower gi bleeding"  . Hypercholesteremia   . Acute lower GI bleeding 08/12/2012  . COPD (chronic obstructive pulmonary disease)   .  Sickle cell trait   . Heart murmur     some say yes , some say no  . Pneumonia 08/2012  . Peripheral vascular disease   . DVT of leg (deep venous thrombosis) 2011    "RLE"  . Type II diabetes mellitus   . Shortness of breath 01/02/12    "@rest ; lying down; w/exertion"; no change 08/12/2012  . ESRD on dialysis     [facility]" (08/10/2014)    Medications:  Scheduled:  . antiseptic oral rinse  7 mL Mouth Rinse q12n4p  . chlorhexidine  15 mL Mouth Rinse BID  . collagenase   Topical Daily   . [START ON 08/19/2014] darbepoetin (ARANESP) injection - DIALYSIS  100 mcg Intravenous Q Wed-HD  . docusate sodium  100 mg Oral BID  . enoxaparin (LOVENOX) injection  30 mg Subcutaneous Q24H  . insulin aspart  0-15 Units Subcutaneous TID WC  . pantoprazole sodium  40 mg Per Tube Daily  . piperacillin-tazobactam (ZOSYN)  IV  2.25 g Intravenous Q8H  . vancomycin  500 mg Intravenous Q T,Th,Sa-HD  . vancomycin  1,000 mg Intravenous Once   Infusions:  . sodium chloride 10 mL/hr at 08/14/14 2000  . feeding supplement (NEPRO CARB STEADY) 1,000 mL (08/17/14 1127)   Assessment: 53 yo female with left AKA wound infection continues on vancomycin and zosyn.  Patient has a history of ESRD on dialysis [facility] - per nephro ok to do 3 days per week MWF inpatient. Last HD today 12/14 (tolerated).  Goal of Therapy:  pre-HD vancomycin level 15-25 mcg/mL  Plan:  -Vanc 500mg  IV qHD -F/u confirm HD schedule inpatient -F/u clinical progress, renal function, abx plan  Babs BertinHaley Deyra Perdomo, PharmD Clinical Pharmacist - Resident Pager [phone] 08/17/2014 11:57 AM

## 2014-08-18 ENCOUNTER — Inpatient Hospital Stay (HOSPITAL_COMMUNITY): Payer: Medicare Other

## 2014-08-18 DIAGNOSIS — G8918 Other acute postprocedural pain: Secondary | ICD-10-CM | POA: Insufficient documentation

## 2014-08-18 DIAGNOSIS — Z515 Encounter for palliative care: Secondary | ICD-10-CM

## 2014-08-18 DIAGNOSIS — R739 Hyperglycemia, unspecified: Secondary | ICD-10-CM | POA: Insufficient documentation

## 2014-08-18 LAB — BASIC METABOLIC PANEL
ANION GAP: 17 — AB (ref 5–15)
Anion gap: 17 — ABNORMAL HIGH (ref 5–15)
BUN: 25 mg/dL — ABNORMAL HIGH (ref 6–23)
BUN: 31 mg/dL — ABNORMAL HIGH (ref 6–23)
CALCIUM: 10.9 mg/dL — AB (ref 8.4–10.5)
CO2: 26 mEq/L (ref 19–32)
CO2: 24 mEq/L (ref 19–32)
Calcium: 10.1 mg/dL (ref 8.4–10.5)
Chloride: 91 mEq/L — ABNORMAL LOW (ref 96–112)
Chloride: 91 mEq/L — ABNORMAL LOW (ref 96–112)
Creatinine, Ser: 2.49 mg/dL — ABNORMAL HIGH (ref 0.50–1.10)
Creatinine, Ser: 2.73 mg/dL — ABNORMAL HIGH (ref 0.50–1.10)
GFR calc Af Amer: 24 mL/min — ABNORMAL LOW (ref >90)
GFR calc Af Amer: 21 mL/min — ABNORMAL LOW (ref >90)
GFR, EST NON AFRICAN AMERICAN: 21 mL/min — AB (ref >90)
GFR, EST NON AFRICAN AMERICAN: 18 mL/min — AB (ref >90)
GLUCOSE: 363 mg/dL — AB (ref 70–99)
Glucose, Bld: 403 mg/dL — ABNORMAL HIGH (ref 70–99)
Potassium: 2.8 mEq/L — CL (ref 3.7–5.3)
Potassium: 3.4 mEq/L — ABNORMAL LOW (ref 3.7–5.3)
SODIUM: 134 meq/L — AB (ref 137–147)
SODIUM: 132 meq/L — AB (ref 137–147)

## 2014-08-18 LAB — GLUCOSE, CAPILLARY
GLUCOSE-CAPILLARY: 308 mg/dL — AB (ref 70–99)
Glucose-Capillary: 445 mg/dL — ABNORMAL HIGH (ref 70–99)
Glucose-Capillary: 437 mg/dL — ABNORMAL HIGH (ref 70–99)
Glucose-Capillary: 307 mg/dL — ABNORMAL HIGH (ref 70–99)
Glucose-Capillary: 278 mg/dL — ABNORMAL HIGH (ref 70–99)
Glucose-Capillary: 225 mg/dL — ABNORMAL HIGH (ref 70–99)
Glucose-Capillary: 158 mg/dL — ABNORMAL HIGH (ref 70–99)
Glucose-Capillary: 89 mg/dL (ref 70–99)

## 2014-08-18 LAB — CBC
HEMATOCRIT: 30 % — AB (ref 36.0–46.0)
HEMOGLOBIN: 9.3 g/dL — AB (ref 12.0–15.0)
MCH: 27.8 pg (ref 26.0–34.0)
MCHC: 31 g/dL (ref 30.0–36.0)
MCV: 89.8 fL (ref 78.0–100.0)
Platelets: 196 10*3/uL (ref 150–400)
RBC: 3.34 MIL/uL — ABNORMAL LOW (ref 3.87–5.11)
RDW: 16.4 % — AB (ref 11.5–15.5)
WBC: 13.8 10*3/uL — AB (ref 4.0–10.5)

## 2014-08-18 MED ORDER — ACETAMINOPHEN 160 MG/5ML PO SOLN
650.0000 mg | ORAL | Status: DC | PRN
  Administered 2014-08-18: 650 mg
  Filled 2014-08-18: qty 20.3

## 2014-08-18 MED ORDER — INSULIN ASPART 100 UNIT/ML ~~LOC~~ SOLN: 4.0000 [IU] | Freq: Three times a day (TID) | SUBCUTANEOUS | Status: DC

## 2014-08-18 MED ORDER — POTASSIUM CHLORIDE 20 MEQ/15ML (10%) PO SOLN
20.0000 meq | Freq: Every day | ORAL | Status: DC
  Administered 2014-08-18: 40 meq via ORAL
  Filled 2014-08-18 (×2): qty 30

## 2014-08-18 MED ORDER — MUPIROCIN 2 % EX OINT
1.0000 "application " | TOPICAL_OINTMENT | Freq: Two times a day (BID) | CUTANEOUS | Status: DC
  Administered 2014-08-18 (×3): 1 via NASAL
  Filled 2014-08-18: qty 22

## 2014-08-18 MED ORDER — POTASSIUM CHLORIDE 10 MEQ/100ML IV SOLN
10.0000 meq | INTRAVENOUS | Status: AC
  Administered 2014-08-18 (×4): 10 meq via INTRAVENOUS
  Filled 2014-08-18: qty 100

## 2014-08-18 MED ORDER — INSULIN GLARGINE 100 UNIT/ML ~~LOC~~ SOLN
12.0000 [IU] | Freq: Every day | SUBCUTANEOUS | Status: DC
  Administered 2014-08-18: 12 [IU] via SUBCUTANEOUS
  Filled 2014-08-18 (×2): qty 0.12

## 2014-08-18 MED ORDER — DEXTROSE-NACL 5-0.45 % IV SOLN: INTRAVENOUS | Status: DC

## 2014-08-18 MED ORDER — SODIUM CHLORIDE 0.9 % IV SOLN
20.0000 mL | INTRAVENOUS | Status: DC
  Administered 2014-08-18: 10 mL via INTRAVENOUS

## 2014-08-18 MED ORDER — INSULIN ASPART 100 UNIT/ML ~~LOC~~ SOLN
0.0000 [IU] | SUBCUTANEOUS | Status: DC
  Administered 2014-08-18: 2 [IU] via SUBCUTANEOUS
  Administered 2014-08-18: 8 [IU] via SUBCUTANEOUS
  Administered 2014-08-18: 16 [IU] via SUBCUTANEOUS
  Administered 2014-08-19: 4 [IU] via SUBCUTANEOUS

## 2014-08-18 MED ORDER — POTASSIUM CHLORIDE 10 MEQ/100ML IV SOLN: 10.0000 meq | INTRAVENOUS | Status: DC

## 2014-08-18 MED ORDER — CHLORHEXIDINE GLUCONATE CLOTH 2 % EX PADS
6.0000 | MEDICATED_PAD | Freq: Every day | CUTANEOUS | Status: DC
  Administered 2014-08-18: 6 via TOPICAL

## 2014-08-18 MED ORDER — SODIUM CHLORIDE 0.9 % IV SOLN
INTRAVENOUS | Status: DC
  Administered 2014-08-18: 2.5 [IU]/h via INTRAVENOUS
  Filled 2014-08-18: qty 2.5

## 2014-08-18 MED ORDER — INSULIN ASPART 100 UNIT/ML ~~LOC~~ SOLN: 0.0000 [IU] | SUBCUTANEOUS | Status: DC

## 2014-08-18 MED ORDER — FENTANYL CITRATE 0.05 MG/ML IJ SOLN
12.5000 ug | INTRAMUSCULAR | Status: DC | PRN
  Administered 2014-08-18 – 2014-08-19 (×6): 25 ug via INTRAVENOUS
  Filled 2014-08-18 (×6): qty 2

## 2014-08-18 MED ORDER — POTASSIUM CHLORIDE 10 MEQ/100ML IV SOLN
10.0000 meq | INTRAVENOUS | Status: AC
  Administered 2014-08-18 (×2): 10 meq via INTRAVENOUS
  Filled 2014-08-18 (×2): qty 100

## 2014-08-18 NOTE — Clinical Social Work Psychosocial (Signed)
Clinical Social Work Department BRIEF PSYCHOSOCIAL ASSESSMENT 08/18/2014  Patient:  Paula, [name]     Account Number:  [id number]     Admit date:  08/10/2014  Clinical Social Worker:  Marciano Sequin  Date/Time:  08/18/2014 06:12 PM  Referred by:  RN  Date Referred:  08/18/2014 Referred for  Residential hospice placement   Other Referral:   Interview type:  Family Other interview type:   Pt's was sleep; Pt's husband Lobbyist and daughter [name]    PSYCHOSOCIAL DATA Living Status:  HUSBAND Admitted from facility:   Level of care:   Primary support name:  [facility] Primary support relationship to patient:  SPOUSE Degree of support available:   Strong Support System    CURRENT CONCERNS Current Concerns  None Noted   Other Concerns:    SOCIAL WORK ASSESSMENT / PLAN CSW met with the pt's husband [name] and pt's daughter [name]. CSW introduced self and purpose of visit. CSW and the pt discussed the clinical team recommendations for hospice. CSW and the family discussed the geographic area in which they expressed interest in transitioning the pt to. The family expressed interested in [facility]. CSW placed a referral to [facility]. CSW provided the family with a list of other residential hospice agencies. CSW provided the family with contact information for further questions. CSW will continue to follow this pt and assist with discharge as needed.   Assessment/plan status:  Psychosocial Support/Ongoing Assessment of Needs Other assessment/ plan:   Information/referral to community resources:    PATIENT'S/FAMILY'S RESPONSE TO PLAN OF CARE: [name] and [name] were both in agreement to hospice. [name] presented with a tearful affect and sad mood. [name] presented with a flat affect and a withdrawal mood. It appears the family is trying to process all the information which has been given to them over the last 2 days.   [place], MSW, [place]

## 2014-08-18 NOTE — Progress Notes (Addendum)
Progress Note    08/18/2014 7:32 AM 6 Days Post-Op  Subjective:  Sleeping-awakes to voice with encouragement  Tm 102.6 now 99.3 HR 70's-90's NSR 100's-120's systolic 95% RA  NGT feedings at 45cc/hr  IV ABx: Zosyn Vanc Filed Vitals:   08/18/14 0511  BP: 112/65  Pulse: 83  Temp:   Resp: 13    Physical Exam: Lungs:  Non labored Incisions:  Wound vac in place with good seal   CBC    Component Value Date/Time   WBC 13.8* 08/18/2014 0233   RBC 3.34* 08/18/2014 0233   HGB 9.3* 08/18/2014 0233   HCT 30.0* 08/18/2014 0233   PLT 196 08/18/2014 0233   MCV 89.8 08/18/2014 0233   MCH 27.8 08/18/2014 0233   MCHC 31.0 08/18/2014 0233   RDW 16.4* 08/18/2014 0233   LYMPHSABS 0.6* 07/28/2014 1631   MONOABS 0.8 07/28/2014 1631   EOSABS 0.1 07/28/2014 1631   BASOSABS 0.0 07/28/2014 1631    BMET    Component Value Date/Time   NA 134* 08/18/2014 0233   K 2.8* 08/18/2014 0233   CL 91* 08/18/2014 0233   CO2 26 08/18/2014 0233   GLUCOSE 403* 08/18/2014 0233   BUN 25* 08/18/2014 0233   CREATININE 2.49* 08/18/2014 0233   CALCIUM 10.1 08/18/2014 0233   CALCIUM 7.5* 07/23/2010 1824   GFRNONAA 21* 08/18/2014 0233   GFRAA 24* 08/18/2014 0233    INR    Component Value Date/Time   INR 1.43 08/10/2014 1913     Intake/Output Summary (Last 24 hours) at 08/18/14 0732 Last data filed at 08/18/14 0500  Gross per 24 hour  Intake 2360.42 ml  Output   2117 ml  Net 243.42 ml   Gram Stain --       Result:     FEW WBC PRESENT,BOTH PMN AND MONONUCLEAR  NO ORGANISMS SEEN  Performed at [facility]      Culture --     Result:     FEW METHICILLIN RESISTANT STAPHYLOCOCCUS AUREUS  Note: RIFAMPIN AND GENTAMICIN SHOULD NOT BE USED AS SINGLE DRUGS FOR TREATMENT OF STAPH INFECTIONS. This organism DOES NOT demonstrate inducible Clindamycin resistance in vitro.    Culture & Susceptibility    METHICILLIN RESISTANT STAPHYLOCOCCUS AUREUS    Antibiotic Sensitivity  Microscan Status   CLINDAMYCIN Sensitive <=0.25 SENSITIVE Final   Method: MIC   ERYTHROMYCIN Resistant >=8 RESISTANT Final   Method: MIC   GENTAMICIN Sensitive <=0.5 SENSITIVE Final   Method: MIC   LEVOFLOXACIN Resistant >=8 RESISTANT Final   Method: MIC   OXACILLIN Resistant >=4 RESISTANT Final   Method: MIC   PENICILLIN Resistant >=0.5 RESISTANT Final   Method: MIC   RIFAMPIN Sensitive <=0.5 SENSITIVE Final   Method: MIC   TETRACYCLINE Sensitive 2 SENSITIVE Final   Method: MIC   TRIMETH/SULFA Sensitive <=10 SENSITIVE Final   Method: MIC   VANCOMYCIN Sensitive 1 SENSITIVE Final   Method: MIC   Comments METHICILLIN RESISTANT STAPHYLOCOCCUS AUREUS (MIC)   FEW METHICILLIN RESISTANT STAPHYLOCOCCUS AUREUS           Assessment:  53 y.o. female is s/p:  Revision left AKA, non healing AKA  6 Days Post-Op  Plan: -fever over night of 102.6 and leukocytosis is trending upward.  She is on Vanc/Zosyn, which the MRSA is sensitive to, but Clindamycin may be a better choice.  Will d/w Dr. Darrick PennaFields.   -will order blood cultures as well -wound vac in place and plan is to change vac  tomorrow.  Per RN, there was about 50cc out overnight -Her C diff was negative on 08/16/14 -DVT prophylaxis:  Lovenox -hypokalemia-will supplement   Doreatha MassedSamantha Rhyne, PA-C [facility] [phone] 08/18/2014 7:32 AM    Pt febrile last night.  Coherent occasionally but not really off baseline from this admission Left AKA no real granulation at this point, no evidence of abscess on probing of wound Dead muscle medially Leukocytosis increasing, hyperglycemia will start glucose stabilizer At this point AKA probably not salvageable Will recheck prealbumin assess nutritional status  Will discuss with family whether they wish to pursue palliative care (consult arranged by Dr Arlean HoppingSchertz)  at this point vs possible hip disarticulation D/c VAC and change back to wet to  dry Although culture shows MRSA I suspect this is polymicrobial with necrotic dead tissue involved continue current antibiotics  Fabienne Brunsharles Kaeleb Emond, MD [facility] [place] Office: [phone] Pager: [phone]

## 2014-08-18 NOTE — Consult Note (Signed)
[facility] Liaison: Received request from CSW for family [facility]. Chart reviewed and spoke with patient's spouse and daughter by phone. Family agreeable to transfer tomorrow if patient ready for discharge. Plan to meet with family tomorrow morning at 10:00. Will need discharge summary faxed to [phone]. RN please call report to [phone]. Please arrange transport for patient to arrive before noon. Thank you. Forrestine Himva Renaye Janicki LCSW [phone]

## 2014-08-18 NOTE — Progress Notes (Signed)
[facility] Progress Note   Subjective: not responding to questions, wailing when wounds examined  Filed Vitals:   08/18/14 0000 08/18/14 0400 08/18/14 0511 08/18/14 0751  BP:   112/65 170/72  Pulse:   83   Temp: 100.4 F (38 C) 99.3 F (37.4 C)  99.2 F (37.3 C)  TempSrc: Rectal Rectal  Axillary  Resp:   13 13  Height:      Weight:  53.7 kg (118 lb 6.2 oz)    SpO2:   95%    Exam: Lethargic, somnolent, not in distress No jvd Chest clear bilat RRR no MRG Abd soft, NTND, no ascites Ext bilat AKA, wound vac on L side, no edema Neuro is not cooperative, nonfocal LFA AVG patent  HD: [facility] 4 hrs 51.5 kg 2K/2Ca 400/A1.5 Heparin 3000 U AVG @ LFA No Hectorol No Aranesp or Venofer       Assessment: 1. Non-healing L AKA, s/p revision 11/2 - wound vac, IV vanc/zosyn 2. Fevers - on abx as above 3. ESRD - MWF HD here 4. HTN/volume - cont Benicar/ coreg, vol stable 5. Anemia on aranesp 100/wk 6. MBD cont vit D, binders 7. DM2 8. Severe PVD 9. Failure-to-thrive  10. Partial DNR 11. Hypokalemia  Plan - HD tomorrow.  I have asked husband to meet with rest of family and pall care team to discuss GOC.  Prognosis very poor, we may need to consider transition towards comfort measures.      Vinson Moselleob Lucian Baswell MD  pager [phone]    cell [phone]  08/18/2014, 8:33 AM     Recent Labs Lab 08/11/14 1300  08/14/14 0255 08/15/14 0804 08/17/14 0525 08/18/14 0233  NA 132*  < > 140 135*  --  134*  K 4.9  < > 3.6* 3.8  --  2.8*  CL 90*  < > 97 94*  --  91*  CO2 29  < > 25 25  --  26  GLUCOSE 269*  < > 159* 154*  --  403*  BUN 34*  < > 14 26*  --  25*  CREATININE 4.51*  < > 2.24* 3.86* 3.96* 2.49*  CALCIUM 9.6  < > 10.1 9.3  --  10.1  PHOS 6.5*  --  5.7* 6.1*  --   --   < > = values in this interval not displayed.  Recent Labs Lab 08/13/14 1420 08/14/14 0255 08/15/14 0804  AST 453*  --   --   ALT 114*  --   --    ALKPHOS 67  --   --   BILITOT 0.6  --   --   PROT 6.5  --   --   ALBUMIN 2.1* 2.0* 1.7*    Recent Labs Lab 08/13/14 1420 08/14/14 0255 08/18/14 0233  WBC 10.7* 9.4 13.8*  HGB 5.3* 9.0* 9.3*  HCT 17.7* 27.9* 30.0*  MCV 87.2 86.4 89.8  PLT 184 188 196   . antiseptic oral rinse  7 mL Mouth Rinse q12n4p  . chlorhexidine  15 mL Mouth Rinse BID  . Chlorhexidine Gluconate Cloth  6 each Topical Q0600  . collagenase   Topical Daily  . [START ON 08/19/2014] darbepoetin (ARANESP) injection - DIALYSIS  100 mcg Intravenous Q Wed-HD  . docusate sodium  100 mg Oral BID  . enoxaparin (LOVENOX) injection  30 mg Subcutaneous Q24H  . feeding supplement (PRO-STAT SUGAR FREE 64)  30 mL Per Tube Daily  . insulin aspart  0-15  Units Subcutaneous TID WC  . mupirocin ointment  1 application Nasal BID  . pantoprazole sodium  40 mg Per Tube Daily  . piperacillin-tazobactam (ZOSYN)  IV  2.25 g Intravenous Q8H  . potassium chloride  20-40 mEq Oral Daily  . [START ON 08/19/2014] vancomycin  500 mg Intravenous Q M,W,F-HD   . sodium chloride 10 mL/hr at 08/17/14 1900  . feeding supplement (NEPRO CARB STEADY) 1,000 mL (08/17/14 1127)   sodium chloride, sodium chloride, sodium chloride, sodium chloride, sodium chloride, sodium chloride, acetaminophen **OR** acetaminophen, alum & mag hydroxide-simeth, bisacodyl, feeding supplement (NEPRO CARB STEADY), guaiFENesin-dextromethorphan, heparin, heparin, hydrALAZINE, labetalol, lidocaine (PF), lidocaine-prilocaine, morphine injection, ondansetron, pentafluoroprop-tetrafluoroeth, phenol

## 2014-08-18 NOTE — Consult Note (Addendum)
WOC wound follow up Wound type: VVS following for assessment and plan of care to left stump.  Vac has been discontinued at this time since wound has declined, according to EMR.  Refer to VVS for further assessment and plan of care. Pt's overall status and systemic factors have declined and buttocks wounds continue to evolve into increasing areas of unstageable wounds. Refer to previous progress notes on 12/11. Pressure Ulcer POA: Yes Left buttock  unstageable: Difficult to obtain accurate measurements since patient is screaming and thrashing around when turned. Left buttock approx 2X5X.1cm; 50% yellow, 50% red, mod amt yellow drainage, slight odor. Right buttock Unstageable: 14X14cm; 100% tightly adhered yellow slough. Lower buttocks with 2 areas of stage 2 wounds; each approx 2X2X.2cm, pink and moist. Dressing procedure/placement/frequency: Continue Santyl ointment to chemically debride nonviable tissue to upper wound. Wound cross-hatched with scapel to allow better penetration of Santyl.  Pt tolerated with minimal bleeding and mod discomfort.Foam dressings to protect and promote healing to other sites.Pt is on a low-airloss mattress replacement to reduce pressure.Flexiseal has been inserted, but stool is still leaking around insertion site and soiling wounds r/t close proximity to rectum. Discussed plan of care with family members at bedside and daughter visualized wounds.  If aggressive plan of care is desired, then recommend surgical consult for debridement of buttock wounds. Do not believe that pt could tolerate hydrotherapy r/t pain. Palliative care consult is pending. Family members state they do not want surgery for her buttock wounds at this time.  Cammie Mcgeeawn Hideo Googe MSN, RN, CWOCN, [place], CNS [phone]

## 2014-08-18 NOTE — Progress Notes (Signed)
   08/18/14 1133  Clinical Encounter Type  Visited With Family  Visit Type Spiritual support  Stress Factors  Family Stress Factors Major life changes;Health changes  Chaplain visited family at suggestion of nurse. Family is considering palliative care and experiencing grief at the prospect of losing their loved one. Chaplain normalized these feelings, provided empathic listening, and explored the emotional nature of these important healthcare decisions. Chaplain also helped them identify priorities; both pt's daughter and pt's spouse agree they do not want pt to suffer, even as they don't feel ready to let her go. Daughter said "when [pt] was moved to the stepdown I thought she was getting better," so it has been difficult to consider a more palliative approach. Chaplain provided prayer. Family expressed gratitude for chaplain visit and said it was helpful to talk through the situation. Chaplain will continue to follow. Page if needed.  Wille GlaserMcCray, [name], Chaplain 08/18/2014 11:36 AM

## 2014-08-18 NOTE — Progress Notes (Signed)
Wound nurse assess ischial ulcer and present when she assessed

## 2014-08-18 NOTE — Progress Notes (Signed)
CRITICAL VALUE ALERT  Critical value received:  Potassium 2.8  Date of notification:  08/18/14  Time of notification:  0357  Critical value read back:Yes.    Nurse who received alert:  Cyril LoosenMargaret Modest Draeger, RN  MD notified (1st page):  Leonides SakeBrian Chen, MD  Time of first page:  0400  MD notified (2nd page):  Time of second page:  Responding MD:  Leonides SakeBrian Chen, MD  Time MD responded:  [phone]

## 2014-08-18 NOTE — Consult Note (Signed)
Patient [name]      DOB: 1961/02/17      FUX:[id number]     Consult Note from the Palliative Medicine Team at [facility] Requested by: Dr Jonnie Finner    PCP: Louis Meckel, MD Reason for Consultation: Goals of Care     Phone Number:[phone]  Assessment/Recommendations: 53 yo female with PVD, ESRD, DM, bilat AKA who presented with altered mental status.  Poor wound healing after surgical debridement of AKA, sepsis. Poor prognosis. Palliative consulted to discuss comfort care and goals of care.   1.  Code Status: DNR as goal comfort  2. Goals of Care: Met family for first time today after difficult conversations with Dr Jonnie Finner and Dr Oneida Alar.   Struggling emotionally with course and prognosis.  Ultimately, after heavy discussion, they wish to pursue comfort care and residential hospice. I have placed consult to SW. They base this off her poor long-term prognosis if able to survive major procedures, fear of suffering at end of life, etc as explained by Dr Oneida Alar and Jonnie Finner.  I suspect prognosis is in days to weeks as she will have uncontrolled infection and stopping long-term dialysis. Family wishes to wait on d/c'ing meds today until hospice plans arranged.  I think this comes just from emotional weight of multiple discussions today.  They know that residential hospice will only provide meds for comfort, no dialysis, no tube feeds, etc.     3. Symptom Management:   Pain- I will d/c morphine given her ESRD.  Fentanyl is acceptable alternative for both pain/dyspnea Hyperglycemia- I will d/c insulin gtt which requires frequent blood draws. lantus and SSI for now given goals.  Low threshold to stop insulin all together given goals of comfort and poor baseline currently.    4. Psychosocial/Spiritual: Husbandand 2 daughters present.  Appreciate Chaplain support.  Family/pt from [facility].     Brief HPI: 53 yo female with multiple medical problems as described  elsewhere. Severe PVD requiring bilat AKA.  Felt to have non-healing L AKA s/p debridement. Developed fevers and worsening leukocytocis with concern for sepsis.  Ubncontrolled sugars leading to insulin drip.  Consideration of left hip disarticulation given her poorly healing AKA and sepsis.  Family deciding about comfort care.  This afternoon Wendy is not able to converse with me.  Met with family today as above.        PMH:  Past Medical History  Diagnosis Date  . Hypertension   . Anemia   . Systolic CHF, acute on chronic   . Coronary artery disease   . CHF (congestive heart failure)   . Angina   . Blood transfusion ~2003; 08/12/2012    "when I started on dialysis; lower gi bleeding"  . Hypercholesteremia   . Acute lower GI bleeding 08/12/2012  . COPD (chronic obstructive pulmonary disease)   . Sickle cell trait   . Heart murmur     some say yes , some say no  . Pneumonia 08/2012  . Peripheral vascular disease   . DVT of leg (deep venous thrombosis) 2011    "RLE"  . Type II diabetes mellitus   . Shortness of breath 01/02/12    "_0 ; lying down; w/exertion"; no change 08/12/2012  . ESRD on dialysis     "[place] hemodialysis" (08/10/2014)     PSH: Past Surgical History  Procedure Laterality Date  . Leg amputation below knee Left 06/2010  . 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  Av fistula placement Left ~ 2003    upper arm  . Abdominal hysterectomy  1990's  . Arteriovenous graft placement Left 2011    upper arm  . Vascular surgery    . Cardiac catheterization  ~ 2010  . Cesarean section  1988  . Esophagogastroduodenoscopy  08/12/2012    Procedure: ESOPHAGOGASTRODUODENOSCOPY (EGD);  Durley: Winfield Cunas., MD;  Location: [facility] ENDOSCOPY;  Service: Endoscopy;  Laterality: N/A;  . Colonoscopy  08/13/2012    Procedure: COLONOSCOPY;  Burges: Winfield Cunas., MD;  [facility];  Service: Endoscopy;  Laterality: N/A;  . Resection of  arteriovenous fistula aneurysm Left 03/19/2013    Procedure: EXCISION OF LEFT BRACHIOCEPHALIC ARTERIOVENOUS FISTULA PSEUDOANEURYSM;  Qualley: Conrad [place], MD;  Location: [place];  Service: Vascular;  Laterality: Left;  . Insertion of dialysis catheter N/A 03/19/2013    Procedure: INSERTION OF DIALYSIS CATHETER;  Weichel: Conrad Crane, MD;  Location: [place];  Service: Vascular;  Laterality: N/A;  Right Internal Jugular Placement  . Revision of arteriovenous goretex graft Left 03/19/2013    Procedure: REVISION OF ARTERIOVENOUS GORETEX GRAFT;  Hoggard: Conrad [place], MD;  Location: [facility] OR;  Service: Vascular;  Laterality: Left;  using 87m x 20cm Gore-Tex Graft  . Amputation Left 07/06/2014    Procedure: Left AMPUTATION ABOVE KNEE;  Coto: CElam Dutch MD;  Location[place]  Service: Vascular;  Laterality: Left;  . Abdominal aortagram N/A 06/19/2014    Procedure: ABDOMINAL AORTAGRAM;  Stokely: CElam Dutch MD;  [facility];  Service: Cardiovascular;  Laterality: N/A;  . Stump revision Left 08/12/2014    Procedure: LEFT ABOVE KNEE AMPUTATION STUMP REVISION;  Fenley: CElam Dutch MD;  Location[place] [facility]  Service: Vascular;  Laterality: Left;   I have reviewed the [place] SH and  If appropriate update it with new information. Allergies  Allergen Reactions  . Oxycodone Nausea And Vomiting   Scheduled Meds: . antiseptic oral rinse  7 mL Mouth Rinse q12n4p  . chlorhexidine  15 mL Mouth Rinse BID  . Chlorhexidine Gluconate Cloth  6 each Topical Q0600  . collagenase   Topical Daily  . [START ON 08/19/2014] darbepoetin (ARANESP) injection - DIALYSIS  100 mcg Intravenous Q Wed-HD  . docusate sodium  100 mg Oral BID  . enoxaparin (LOVENOX) injection  30 mg Subcutaneous Q24H  . feeding supplement (PRO-STAT SUGAR FREE 64)  30 mL Per Tube Daily  . mupirocin ointment  1 application Nasal BID  . pantoprazole sodium  40 mg Per Tube Daily  . piperacillin-tazobactam (ZOSYN)  IV  2.25 g  Intravenous Q8H  . potassium chloride  10 mEq Intravenous Q1 Hr x 4  . potassium chloride  20-40 mEq Oral Daily  . [START ON 08/19/2014] vancomycin  500 mg Intravenous Q M,W,F-HD   Continuous Infusions: . sodium chloride 10 mL/hr at 08/17/14 1900  . sodium chloride 10 mL (08/18/14 1107)  . dextrose 5 % and 0.45% NaCl    . feeding supplement (NEPRO CARB STEADY) 1,000 mL (08/18/14 1219)  . insulin (NOVOLIN-R) infusion     PRN Meds:.acetaminophen **OR** acetaminophen, alum & mag hydroxide-simeth, bisacodyl, fentaNYL, guaiFENesin-dextromethorphan, hydrALAZINE, labetalol, ondansetron, phenol    BP 169/78 mmHg  Pulse 83  Temp(Src) 99.2 F (37.3 C) (Axillary)  Resp 11  Ht 5' (1.524 m)  Wt 53.7 kg (118 lb 6.2 oz)  BMI 23.12 kg/m2  SpO2 94%   PPS: 20   Intake/Output  Summary (Last 24 hours) at 08/18/14 1237 Last data filed at 08/18/14 1212  Gross per 24 hour  Intake 2180.42 ml  Output    200 ml  Net 1980.42 ml   Physical Exam:  General: Drowsy, mostly nonverbal with me HEENT:  [place], sclera anciteric Chest:   CTAB CVS: RRR Abdomen: soft, ND Ext: bilateral lower ext amputations Neuro: unable to participate in any meaningful verbal or nonverbal communication.   Labs: CBC    Component Value Date/Time   WBC 13.8* 08/18/2014 0233   RBC 3.34* 08/18/2014 0233   HGB 9.3* 08/18/2014 0233   HCT 30.0* 08/18/2014 0233   PLT 196 08/18/2014 0233   MCV 89.8 08/18/2014 0233   MCH 27.8 08/18/2014 0233   MCHC 31.0 08/18/2014 0233   RDW 16.4* 08/18/2014 0233   LYMPHSABS 0.6* 07/28/2014 1631   MONOABS 0.8 07/28/2014 1631   EOSABS 0.1 07/28/2014 1631   BASOSABS 0.0 07/28/2014 1631    BMET    Component Value Date/Time   NA 134* 08/18/2014 0233   K 2.8* 08/18/2014 0233   CL 91* 08/18/2014 0233   CO2 26 08/18/2014 0233   GLUCOSE 403* 08/18/2014 0233   BUN 25* 08/18/2014 0233   CREATININE 2.49* 08/18/2014 0233   CALCIUM 10.1 08/18/2014 0233   CALCIUM 7.5* 07/23/2010 1824    GFRNONAA 21* 08/18/2014 0233   GFRAA 24* 08/18/2014 0233    CMP     Component Value Date/Time   NA 134* 08/18/2014 0233   K 2.8* 08/18/2014 0233   CL 91* 08/18/2014 0233   CO2 26 08/18/2014 0233   GLUCOSE 403* 08/18/2014 0233   BUN 25* 08/18/2014 0233   CREATININE 2.49* 08/18/2014 0233   CALCIUM 10.1 08/18/2014 0233   CALCIUM 7.5* 07/23/2010 1824   PROT 6.5 08/13/2014 1420   ALBUMIN 1.7* 08/15/2014 0804   AST 453* 08/13/2014 1420   ALT 114* 08/13/2014 1420   ALKPHOS 67 08/13/2014 1420   BILITOT 0.6 08/13/2014 1420   GFRNONAA 21* 08/18/2014 0233   GFRAA 24* 08/18/2014 0233   12/10 CT Head IMPRESSION: 1. Atrophy and small vessel disease. 2. No evidence for acute abnormality.  12/10 CTA Chest IMPRESSION: 1. Technically adequate study without pulmonary embolus. 2. Cardiomegaly. 3. Atherosclerosis and coronary artery disease. 4. Small amount of pericardial fluid or thickening.    Total Time: 60 minutes  Greater than 50%  of this time was spent counseling and coordinating care related to the above assessment and plan.  Doran Clay D.O. Palliative Medicine Team at [facility]  Pager: [phone] Team Phone: [phone]

## 2014-08-18 NOTE — Progress Notes (Signed)
Notified Dr. Imogene Burnhen of rectal temperature of 102.6, as well as neuro status decline since 1900. Per day shift report, patient was oriented x3-4, and upon my assessment at 2000, patient was disoriented x4 and could not follow commands. Pt also became delirious and frequently cried out. Was advised to administer a one-time dose of 5mg  Haldol and to hold narcotics. Will continue to monitor patient.

## 2014-08-18 NOTE — Progress Notes (Addendum)
Inpatient Diabetes Program Recommendations  AACE/ADA: New Consensus Statement on Inpatient Glycemic Control (2013)  Target Ranges:  Prepandial:   less than 140 mg/dL      Peak postprandial:   less than 180 mg/dL (1-2 hours)      Critically ill patients:  140 - 180 mg/dL     Results for [name], [name] (MRN [id number]) as of 08/18/2014 08:41  Ref. Range 08/17/2014 04:52 08/17/2014 10:45 08/17/2014 17:21 08/17/2014 21:58  Glucose-Capillary Latest Range: 70-99 mg/dL 409221 (H) 811214 (H) 914429 (H) 282 (H)    Results for [name], [name] (MRN [id number]) as of 08/18/2014 08:41  Ref. Range 08/18/2014 07:59  Glucose-Capillary Latest Range: 70-99 mg/dL 086445 (H)     Home DM Meds: Lantus 16 units QHS   Current Insulin Orders: Novolog Moderate SSI tid   **Patient POD #6 Left AKA revision.  Currently has wound vac in place.  **Currently taking PO diet along with Nepro Tube feeds at 45 cc/hour.  **Having significant glucose elevations.   Called PA, Doreatha MassedSamantha Rhyne about this patient.  Reviewed recommendations with RN answering page for Ms. Rhyne (PA was prepping for surgery).  Asked PA to start 80% of home Lantus dose (12 units daily), Change SSI to Sensitive scale Q4 hours, and Add low dose Novolog tube feed coverage to cover the carbohydrates in patient's tube feeds- Novolog 2 units Q4 hours.  PA approved all these recommendations and asked me to enter the orders for this patient.   Addendum 0850am: Noted Dr. Darrick PennaFields placed insulin orders this AM.  Spoke with Dr. Darrick PennaFields by phone.  Dr. Darrick PennaFields told me he wants to start patient on IV insulin drip b/[name] her glucose was elevated to 445 mg/dl and patient has pus in her leg.  Will not place the above listed SQ insulin orders as discussed with Doreatha MassedSamantha Rhyne, PA since Dr. Darrick PennaFields wants to start patient on IV insulin drip this AM.       Will follow Ambrose FinlandJeannine Johnston Marlos Carmen RN, MSN, CDE Diabetes Coordinator Inpatient Diabetes Program Team Pager:  [phone] (8a-10p)

## 2014-08-18 NOTE — Progress Notes (Signed)
Called Sam Rhyne PA re cbg 445, new orders to give 15units insulin and to recheck in an hour cbg.

## 2014-08-19 LAB — BASIC METABOLIC PANEL
Anion gap: 16 — ABNORMAL HIGH (ref 5–15)
BUN: 44 mg/dL — AB (ref 6–23)
CHLORIDE: 92 meq/L — AB (ref 96–112)
CO2: 25 mEq/L (ref 19–32)
Calcium: 11.1 mg/dL — ABNORMAL HIGH (ref 8.4–10.5)
Creatinine, Ser: 3.56 mg/dL — ABNORMAL HIGH (ref 0.50–1.10)
GFR calc Af Amer: 16 mL/min — ABNORMAL LOW (ref >90)
GFR, EST NON AFRICAN AMERICAN: 13 mL/min — AB (ref >90)
Glucose, Bld: 179 mg/dL — ABNORMAL HIGH (ref 70–99)
Potassium: 4.7 mEq/L (ref 3.7–5.3)
Sodium: 133 mEq/L — ABNORMAL LOW (ref 137–147)

## 2014-08-19 LAB — CBC
HEMATOCRIT: 32.6 % — AB (ref 36.0–46.0)
Hemoglobin: 10.5 g/dL — ABNORMAL LOW (ref 12.0–15.0)
MCH: 29.3 pg (ref 26.0–34.0)
MCHC: 32.2 g/dL (ref 30.0–36.0)
MCV: 91.1 fL (ref 78.0–100.0)
Platelets: 295 10*3/uL (ref 150–400)
RBC: 3.58 MIL/uL — ABNORMAL LOW (ref 3.87–5.11)
RDW: 16.3 % — AB (ref 11.5–15.5)
WBC: 22.3 10*3/uL — ABNORMAL HIGH (ref 4.0–10.5)

## 2014-08-19 LAB — GLUCOSE, CAPILLARY
GLUCOSE-CAPILLARY: 85 mg/dL (ref 70–99)
GLUCOSE-CAPILLARY: 175 mg/dL — AB (ref 70–99)

## 2014-08-19 LAB — PREALBUMIN: Prealbumin: 13.3 mg/dL — ABNORMAL LOW (ref 17.0–34.0)

## 2014-08-19 NOTE — Progress Notes (Signed)
[facility]. RN requested that both IV and flexiseal stay in place [facility]. VSS. Family at bedside and will be with patient until [facility] arrives to pick up patient for transfer. Family has all belongs. Will continue to monitor. CMT and elink notified of discharge.

## 2014-08-19 NOTE — Progress Notes (Signed)
Medicare Important Message given? YES  (If response is "NO", the following Medicare IM given date fields will be blank)  Date Medicare IM given: 08/19/14 Medicare IM given by:  Greysen Devino  

## 2014-08-19 NOTE — Progress Notes (Signed)
[facility] [place]  Subjective  - sleepy but arousable   Objective 173/67 89 99.3 F (37.4 C) (Axillary) 16 95%  Intake/Output Summary (Last 24 hours) at 08/19/14 0820 Last data filed at 08/19/14 0700  Gross per 24 hour  Intake   1613 ml  Output      0 ml  Net   1613 ml   Overall appears comfortable although pain with dressing change  Assessment/Planning: Pursuing Hospice care today Plan and current condition discussed with family at bedside  FIELDS,CHARLES E 08/19/2014 8:20 AM --  Laboratory Lab Results:  Recent Labs  08/18/14 0233 08/19/14 0400  WBC 13.8* 22.3*  HGB 9.3* 10.5*  HCT 30.0* 32.6*  PLT 196 295   BMET  Recent Labs  08/18/14 1125 08/19/14 0400  NA 132* 133*  K 3.4* 4.7  CL 91* 92*  CO2 24 25  GLUCOSE 363* 179*  BUN 31* 44*  CREATININE 2.73* 3.56*  CALCIUM 10.9* 11.1*    COAG Lab Results  Component Value Date   INR 1.43 08/10/2014   INR 1.15 05/24/2010   No results found for: PTT

## 2014-08-19 NOTE — Discharge Summary (Signed)
Discharge Summary    [name] 1961/04/07 53 y.o. female  [id number]  Admission Date: 08/10/2014  Discharge Date: 08/19/14  Physician: Sherren Kernsharles E Fields, MD  Admission Diagnosis: non healing rt AKA stump nonhealing aka stump incision T81.89X   HPI:   This is a 53 y.o. 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 The surgery was performed by Dr. Darrick PennaFields of [facility] on 11/2, but over the last two weeks the patient has had worsening pain. She was treated in the [facility] office on 12/3 for a superficial epidermal loss at the inner left thigh and buttock, but during follow-up today had opening of the surgical incision with sanguinous discharge which required hospital admission with wound debridement per Dr. Darrick PennaFields scheduled on 12/9. Her dialysis is now scheduled four days a week, but recently she signs off early, secondary to pain, and averages about 2:15 per treatment.   Hospital Course:  The patient was admitted to the hospital and taken to the operating room on 08/10/2014 - 08/12/2014 and underwent: Revision left above knee amputation with VAC placement    The pt tolerated the procedure well and was transported to the PACU in stable condition.   Given the extensive nature of the necrotic tissue, the decision was made to place a feeding tube for temporary support to promote healing.  On POD 1, she was found to be unresponsive with a glucose of 64.  She was given an 1.5 amps of D5 and become more responsive. Her glucose is now 143. Upon arrival, her systolic BP was 118 and is now 130's systolic. She is on a non-rebreather and her sats are registering in the 60's. Unsure if this is  accurate as her fingers are cold. She was lethargic yesterday and an accurate O2 saturation was unable to be obtained. A blood gas was obtained with the following results:  Arterial Blood Gas result: pO2 155; pCO2 46.7; pH 7.35; HCO3 25, %O2 Sat 99%.  When Dr. Darrick PennaFields saw her this morning, she would respond to sternal rub and moan. She is now responding to sternal rub and opening her eyes.   A stat CTA for PE has been ordered as well as an ABG and she will be transferred to the ICU. Stat CBC and CMET have also been ordered. She is receiving Lovenox for DVT prophylaxis. All narcotics have been discontinued.  PE CT was negative as well as CXR.  Head CT was also negative.  WOC was also consulted for her buttocks wound.  She did have diarrhea and her C diff was negative.  Wound vac was continued until POD 6.  It was removed and at this point, the AKA is probably not salvageable and would most likely require a hip disarticulation.   The vac was d/c'd and wet to dry dressings ordered.  A palliative care consult was obtained and it was decided on comfort care.    CBC    Component Value Date/Time   WBC 22.3* 08/19/2014 0400   RBC 3.58* 08/19/2014 0400   HGB 10.5* 08/19/2014 0400   HCT 32.6* 08/19/2014 0400   PLT 295 08/19/2014 0400   MCV 91.1 08/19/2014 0400   MCH 29.3 08/19/2014 0400  MCHC 32.2 08/19/2014 0400   RDW 16.3* 08/19/2014 0400   LYMPHSABS 0.6* 07/28/2014 1631   MONOABS 0.8 07/28/2014 1631   EOSABS 0.1 07/28/2014 1631   BASOSABS 0.0 07/28/2014 1631    BMET    Component Value Date/Time   NA 133* 08/19/2014 0400   K 4.7 08/19/2014 0400   CL 92* 08/19/2014 0400   CO2 25 08/19/2014 0400   GLUCOSE 179* 08/19/2014 0400   BUN 44* 08/19/2014 0400   CREATININE 3.56* 08/19/2014 0400   CALCIUM 11.1* 08/19/2014 0400   CALCIUM 7.5* 07/23/2010 1824   GFRNONAA 13* 08/19/2014 0400   GFRAA 16* 08/19/2014 0400     Discharge Instructions:   [facility]    Discharge Diagnosis:  non healing rt AKA stump nonhealing aka stump incision T81.89X  Secondary Diagnosis: Patient Active Problem List   Diagnosis Date Noted  . Palliative care encounter   . Post-op pain   . Hyperglycemia   . Acute-on-chronic respiratory failure 08/13/2014  . Sepsis 08/13/2014  . Acute encephalopathy 08/13/2014  . Wound drainage 07/29/2014  . Hip, thigh, leg, and ankle blister, infected 07/29/2014  . Non-healing amputation site 07/06/2014  . Peeling skin-Right middle finger 05/29/2014  . 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 Pain of left lower extremity-and Right  STUMP 05/29/2014  . Removal of staple 04/04/2013  . Aftercare following surgery of the circulatory system, NEC 04/04/2013  . End stage renal disease 03/14/2013  . Other complications due to renal dialysis device, implant, and graft 03/14/2013  . Pseudoaneurysm of AV hemodialysis fistula 03/14/2013  . Vomiting 08/28/2012  . HAP (hospital-acquired pneumonia) 08/24/2012  . ESRD on hemodialysis 08/24/2012  . Rectal bleed 08/12/2012  . Cough 05/03/2012  . Acute respiratory failure with hypoxia 01/02/2012  . Pulmonary edema with congestive heart failure with reduced left ventricular function 10/10/2011  . CKD (chronic kidney disease) requiring chronic dialysis 10/10/2011  . HTN (hypertension)-accelerated/uncontrolled at admission 10/10/2011  . DM (diabetes mellitus) 10/10/2011  . Anemia 10/10/2011  . PVD (peripheral vascular disease) 10/10/2011  . CAD in native artery 10/10/2011   Past Medical History  Diagnosis Date  . Hypertension   . Anemia   . Systolic CHF, acute on chronic   . Coronary artery disease   . CHF (congestive heart failure)   . Angina   . Blood transfusion ~2003; 08/12/2012    "when I started on dialysis; lower gi bleeding"  . Hypercholesteremia   . Acute lower GI bleeding 08/12/2012  . COPD (chronic  obstructive pulmonary disease)   . Sickle cell trait   . Heart murmur     some say yes , some say no  . Pneumonia 08/2012  . Peripheral vascular disease   . DVT of leg (deep venous thrombosis) 2011    "RLE"  . Type II diabetes mellitus   . Shortness of breath 01/02/12    "@rest ; lying down; w/exertion"; no change 08/12/2012  . ESRD on dialysis     [facility] [facility]" (08/10/2014)       Medication List    STOP taking these medications        BENICAR 40 MG tablet  Generic drug:  olmesartan     carvedilol 25 MG tablet  Commonly known as:  COREG     gabapentin 300 MG capsule  Commonly known as:  NEURONTIN     insulin glargine 100 UNIT/ML injection  Commonly known as:  LANTUS     sevelamer carbonate 800 MG tablet  Commonly known as:  RENVELA         Disposition: [facility]  Patient's condition: is Poor     Doreatha Massed, PA-C [facility] [phone] 08/19/2014  8:26 AM

## 2014-08-19 NOTE — Progress Notes (Signed)
  Progress Note    08/19/2014 8:13 AM 7 Days Post-Op  Subjective:  Sleepy-does not awake to voice this morning  Tm 101.4 now 99.3 HR 80's-100's NSR 110's-170's systolic 95% RA  Filed Vitals:   08/19/14 0315  BP: 173/67  Pulse: 89  Temp: 99.3 F (37.4 C)  Resp: 16    Physical Exam: Lungs:  Non labored Incisions:  Not examined-dressing changed this morning at 4am   CBC    Component Value Date/Time   WBC 22.3* 08/19/2014 0400   RBC 3.58* 08/19/2014 0400   HGB 10.5* 08/19/2014 0400   HCT 32.6* 08/19/2014 0400   PLT 295 08/19/2014 0400   MCV 91.1 08/19/2014 0400   MCH 29.3 08/19/2014 0400   MCHC 32.2 08/19/2014 0400   RDW 16.3* 08/19/2014 0400   LYMPHSABS 0.6* 07/28/2014 1631   MONOABS 0.8 07/28/2014 1631   EOSABS 0.1 07/28/2014 1631   BASOSABS 0.0 07/28/2014 1631    BMET    Component Value Date/Time   NA 133* 08/19/2014 0400   K 4.7 08/19/2014 0400   CL 92* 08/19/2014 0400   CO2 25 08/19/2014 0400   GLUCOSE 179* 08/19/2014 0400   BUN 44* 08/19/2014 0400   CREATININE 3.56* 08/19/2014 0400   CALCIUM 11.1* 08/19/2014 0400   CALCIUM 7.5* 07/23/2010 1824   GFRNONAA 13* 08/19/2014 0400   GFRAA 16* 08/19/2014 0400    INR    Component Value Date/Time   INR 1.43 08/10/2014 1913     Intake/Output Summary (Last 24 hours) at 08/19/14 0813 Last data filed at 08/19/14 0700  Gross per 24 hour  Intake   1613 ml  Output      0 ml  Net   1613 ml     Assessment:  53 y.o. female is s/p:  Revision left AKA, non healing AKA   7 Days Post-Op  Plan: -pt now a DNR and goal is comfort care -will d/c all orders & ABx and [facility] this [facility] after conference with family   Doreatha MassedSamantha Jazzmon Prindle, PA-C [facility] [phone] 08/19/2014 8:13 AM

## 2014-08-19 NOTE — Care Management Note (Signed)
    Page 1 of 1   08/19/2014     1:48:12 PM CARE MANAGEMENT NOTE 08/19/2014  Patient:  Paula RegalSURGEON,[name]   Account Number:  [id number]  Date Initiated:  08/17/2014  Documentation initiated by:  Avie ArenasBROWN,SARAH  Subjective/Objective Assessment:   Non healing L AKA - revision on 12-10.  Septic     Action/Plan:   Anticipated DC Date:  08/19/2014   Anticipated DC Plan:  HOSPICE MEDICAL FACILITY      DC Planning Services  CM consult      Choice offered to / List presented to:             Status of service:  Completed, signed off Medicare Important Message given?  YES (If response is "NO", the following Medicare IM given date fields will be blank) Date Medicare IM given:  08/19/2014 Medicare IM given by:  Donn PieriniWEBSTER,Marcin Holte Date Additional Medicare IM given:   Additional Medicare IM given by:    Discharge Disposition:  HOSPICE MEDICAL FACILITY  Per UR Regulation:  Reviewed for med. necessity/level of care/duration of stay  If discussed at Long Length of Stay Meetings, dates discussed:    Comments:  ContactDorene Ar:  Dilworth,Willie Spouse   [phone]    Neal,Tywanda Daughter   [phone]   Blanchard ManeSurgeon,Shywanda Daughter   [phone]  08/19/14- 1030- Donn PieriniKristi Naser Schuld RN, BSN [phone] Pt Full Comfort Care now and [facility] d[facility][name], CSW following for d/[name] needs

## 2014-08-19 NOTE — Clinical Social Work Note (Addendum)
CSW updated by Harmon Pier from [facility] regarding a bed offer from the pt. Harmon Pier reported the pt can come to [facility] today. CSW will notify MD.    CSW met pt's family. CSW informed the family that the pt will be discharge today. CSW and family discussed ambulance transport.  CSW contact [facility] at [phone] to schedule transport for the pt. CSW informed Harmon Pier regarding the transport time. CSW faxed the pt's discharge summary to [facility]. Bedside RN provided number to call reported.   [place], MSW, [place]

## 2014-08-20 ENCOUNTER — Ambulatory Visit: Payer: Medicare Other | Admitting: Vascular Surgery

## 2014-08-24 LAB — CULTURE, BLOOD (SINGLE)
CULTURE: NO GROWTH
Culture: NO GROWTH

## 2014-09-04 DEATH — deceased
